# Patient Record
Sex: Female | Born: 1945 | Race: White | Hispanic: No | Marital: Single | Smoking: Former smoker
Health system: Southern US, Community
[De-identification: ages and names within clinical notes are randomized; demographics above are authoritative.]

## PROBLEM LIST (undated history)

## (undated) DIAGNOSIS — F32A Depression, unspecified: Secondary | ICD-10-CM

## (undated) DIAGNOSIS — Z86718 Personal history of other venous thrombosis and embolism: Secondary | ICD-10-CM

## (undated) DIAGNOSIS — K219 Gastro-esophageal reflux disease without esophagitis: Secondary | ICD-10-CM

## (undated) DIAGNOSIS — M810 Age-related osteoporosis without current pathological fracture: Secondary | ICD-10-CM

## (undated) DIAGNOSIS — I10 Essential (primary) hypertension: Secondary | ICD-10-CM

## (undated) DIAGNOSIS — F329 Major depressive disorder, single episode, unspecified: Secondary | ICD-10-CM

## (undated) DIAGNOSIS — T7840XA Allergy, unspecified, initial encounter: Secondary | ICD-10-CM

## (undated) DIAGNOSIS — D126 Benign neoplasm of colon, unspecified: Secondary | ICD-10-CM

## (undated) DIAGNOSIS — I350 Nonrheumatic aortic (valve) stenosis: Secondary | ICD-10-CM

## (undated) DIAGNOSIS — K621 Rectal polyp: Secondary | ICD-10-CM

## (undated) DIAGNOSIS — K52832 Lymphocytic colitis: Secondary | ICD-10-CM

## (undated) HISTORY — DX: Lymphocytic colitis: K52.832

## (undated) HISTORY — DX: Personal history of other venous thrombosis and embolism: Z86.718

## (undated) HISTORY — PX: HAND SURGERY: SHX662

## (undated) HISTORY — DX: Age-related osteoporosis without current pathological fracture: M81.0

## (undated) HISTORY — DX: Gastro-esophageal reflux disease without esophagitis: K21.9

## (undated) HISTORY — DX: Allergy, unspecified, initial encounter: T78.40XA

## (undated) HISTORY — DX: Benign neoplasm of colon, unspecified: D12.6

## (undated) HISTORY — DX: Essential (primary) hypertension: I10

## (undated) HISTORY — DX: Major depressive disorder, single episode, unspecified: F32.9

## (undated) HISTORY — DX: Rectal polyp: K62.1

## (undated) HISTORY — PX: TONSILLECTOMY AND ADENOIDECTOMY: SHX28

## (undated) HISTORY — DX: Depression, unspecified: F32.A

## (undated) HISTORY — PX: TRANSCATHETER AORTIC VALVE REPLACEMENT, TRANSFEMORAL: SHX6400

## (undated) HISTORY — PX: COLONOSCOPY: SHX174

---

## 1986-08-09 HISTORY — PX: FOOT SURGERY: SHX648

## 2000-06-13 ENCOUNTER — Other Ambulatory Visit: Admission: RE | Admit: 2000-06-13 | Discharge: 2000-06-13 | Payer: Self-pay | Admitting: Obstetrics and Gynecology

## 2001-08-22 ENCOUNTER — Other Ambulatory Visit: Admission: RE | Admit: 2001-08-22 | Discharge: 2001-08-22 | Payer: Self-pay | Admitting: Obstetrics and Gynecology

## 2002-04-23 ENCOUNTER — Encounter: Payer: Self-pay | Admitting: Family Medicine

## 2002-04-23 ENCOUNTER — Ambulatory Visit (HOSPITAL_COMMUNITY): Admission: RE | Admit: 2002-04-23 | Discharge: 2002-04-23 | Payer: Self-pay | Admitting: Family Medicine

## 2003-07-25 ENCOUNTER — Ambulatory Visit (HOSPITAL_COMMUNITY): Admission: RE | Admit: 2003-07-25 | Discharge: 2003-07-25 | Payer: Self-pay | Admitting: Family Medicine

## 2004-07-22 ENCOUNTER — Ambulatory Visit (HOSPITAL_COMMUNITY): Admission: RE | Admit: 2004-07-22 | Discharge: 2004-07-22 | Payer: Self-pay | Admitting: Family Medicine

## 2004-09-08 ENCOUNTER — Other Ambulatory Visit: Admission: RE | Admit: 2004-09-08 | Discharge: 2004-09-08 | Payer: Self-pay | Admitting: Family Medicine

## 2004-10-19 ENCOUNTER — Encounter: Admission: RE | Admit: 2004-10-19 | Discharge: 2005-01-17 | Payer: Self-pay | Admitting: Family Medicine

## 2005-09-14 ENCOUNTER — Other Ambulatory Visit: Admission: RE | Admit: 2005-09-14 | Discharge: 2005-09-14 | Payer: Self-pay | Admitting: Family Medicine

## 2005-09-28 ENCOUNTER — Encounter: Admission: RE | Admit: 2005-09-28 | Discharge: 2005-09-28 | Payer: Self-pay | Admitting: Family Medicine

## 2005-12-30 ENCOUNTER — Encounter: Admission: RE | Admit: 2005-12-30 | Discharge: 2005-12-30 | Payer: Self-pay | Admitting: Family Medicine

## 2006-01-10 ENCOUNTER — Encounter: Admission: RE | Admit: 2006-01-10 | Discharge: 2006-01-10 | Payer: Self-pay | Admitting: Family Medicine

## 2006-06-16 ENCOUNTER — Encounter: Admission: RE | Admit: 2006-06-16 | Discharge: 2006-06-16 | Payer: Self-pay | Admitting: Family Medicine

## 2006-07-04 ENCOUNTER — Encounter: Admission: RE | Admit: 2006-07-04 | Discharge: 2006-07-04 | Payer: Self-pay | Admitting: Family Medicine

## 2008-01-09 ENCOUNTER — Encounter: Admission: RE | Admit: 2008-01-09 | Discharge: 2008-01-09 | Payer: Self-pay | Admitting: Family Medicine

## 2009-01-16 ENCOUNTER — Encounter: Admission: RE | Admit: 2009-01-16 | Discharge: 2009-01-16 | Payer: Self-pay | Admitting: Family Medicine

## 2010-08-25 ENCOUNTER — Encounter
Admission: RE | Admit: 2010-08-25 | Discharge: 2010-08-25 | Payer: Self-pay | Source: Home / Self Care | Attending: Family Medicine | Admitting: Family Medicine

## 2010-09-01 ENCOUNTER — Encounter
Admission: RE | Admit: 2010-09-01 | Discharge: 2010-09-01 | Payer: Self-pay | Source: Home / Self Care | Attending: Family Medicine | Admitting: Family Medicine

## 2011-04-16 ENCOUNTER — Ambulatory Visit: Payer: Self-pay | Admitting: Family Medicine

## 2011-04-26 ENCOUNTER — Ambulatory Visit (INDEPENDENT_AMBULATORY_CARE_PROVIDER_SITE_OTHER): Payer: BC Managed Care – PPO | Admitting: Family Medicine

## 2011-04-26 ENCOUNTER — Encounter: Payer: Self-pay | Admitting: Family Medicine

## 2011-04-26 DIAGNOSIS — R030 Elevated blood-pressure reading, without diagnosis of hypertension: Secondary | ICD-10-CM

## 2011-04-26 DIAGNOSIS — F418 Other specified anxiety disorders: Secondary | ICD-10-CM | POA: Insufficient documentation

## 2011-04-26 DIAGNOSIS — I10 Essential (primary) hypertension: Secondary | ICD-10-CM | POA: Insufficient documentation

## 2011-04-26 DIAGNOSIS — E559 Vitamin D deficiency, unspecified: Secondary | ICD-10-CM

## 2011-04-26 DIAGNOSIS — G4700 Insomnia, unspecified: Secondary | ICD-10-CM | POA: Insufficient documentation

## 2011-04-26 DIAGNOSIS — F329 Major depressive disorder, single episode, unspecified: Secondary | ICD-10-CM

## 2011-04-26 DIAGNOSIS — J45909 Unspecified asthma, uncomplicated: Secondary | ICD-10-CM | POA: Insufficient documentation

## 2011-04-26 DIAGNOSIS — F32A Depression, unspecified: Secondary | ICD-10-CM

## 2011-04-26 DIAGNOSIS — R609 Edema, unspecified: Secondary | ICD-10-CM

## 2011-04-26 DIAGNOSIS — IMO0001 Reserved for inherently not codable concepts without codable children: Secondary | ICD-10-CM

## 2011-04-26 DIAGNOSIS — F3289 Other specified depressive episodes: Secondary | ICD-10-CM

## 2011-04-26 LAB — BASIC METABOLIC PANEL
BUN: 26 mg/dL — ABNORMAL HIGH (ref 6–23)
CO2: 25 mEq/L (ref 19–32)
Calcium: 9.4 mg/dL (ref 8.4–10.5)
Chloride: 104 mEq/L (ref 96–112)
Creatinine, Ser: 0.8 mg/dL (ref 0.4–1.2)
GFR: 80.19 mL/min (ref >60.00)
Glucose, Bld: 91 mg/dL (ref 70–99)
Potassium: 3.7 mEq/L (ref 3.5–5.1)
Sodium: 137 mEq/L (ref 135–145)

## 2011-04-26 MED ORDER — HYDROCHLOROTHIAZIDE 25 MG PO TABS: 25.0000 mg | ORAL_TABLET | Freq: Every day | ORAL | Status: DC

## 2011-04-26 MED ORDER — FLUTICASONE-SALMETEROL 250-50 MCG/DOSE IN AEPB: 1.0000 | INHALATION_SPRAY | Freq: Two times a day (BID) | RESPIRATORY_TRACT | Status: DC

## 2011-04-26 MED ORDER — TRAZODONE HCL 50 MG PO TABS: ORAL_TABLET | ORAL | Status: DC

## 2011-04-26 MED ORDER — BUPROPION HCL ER (XL) 300 MG PO TB24: 300.0000 mg | ORAL_TABLET | Freq: Every day | ORAL | Status: DC

## 2011-04-26 NOTE — Assessment & Plan Note (Signed)
hctz 25m g  1 po qd  Elevate legs 

## 2011-04-26 NOTE — Assessment & Plan Note (Signed)
Stable. Refill meds

## 2011-04-26 NOTE — Assessment & Plan Note (Signed)
Refill meds

## 2011-04-26 NOTE — Patient Instructions (Signed)

## 2011-04-26 NOTE — Progress Notes (Signed)
  Subjective:    Patient ID: Samantha Davidson, female    DOB: 1946-06-13, 65 y.o.   MRN: [id number]  HPI Pt here to establish and get medication refills.  Pt with no complaints.   Past Medical History  Diagnosis Date  . Allergy   . Asthma   . Depression   . GERD (gastroesophageal reflux disease)    History   Social History  . Marital Status: Married    Spouse Name: N/A    Number of Children: N/A  . Years of Education: N/A   Occupational History  . interior designer    Social History Main Topics  . Smoking status: Passive Smoker  . Smokeless tobacco: Not on file   Comment: only when stressed  . Alcohol Use: Yes  . Drug Use: No  . Sexually Active: Not on file   Other Topics Concern  . Not on file   Social History Narrative  . No narrative on file   Family History  Problem Relation Age of Onset  . Arthritis Maternal Grandmother   . Lung cancer Mother   . Heart disease Mother   . Mental illness Mother    Review of Systems as above   Objective:   Physical Exam  Constitutional: She is oriented to person, place, and time. She appears well-developed and well-nourished.  Neck: Normal range of motion. Neck supple.  Cardiovascular: Normal rate, regular rhythm and normal heart sounds.   No murmur heard. Pulmonary/Chest: Effort normal and breath sounds normal. No respiratory distress. She has no wheezes. She has no rales. She exhibits no tenderness.  Musculoskeletal: She exhibits no edema.  Neurological: She is alert and oriented to person, place, and time.  Skin: Skin is warm and dry.  Psychiatric: She has a normal mood and affect. Her behavior is normal. Judgment and thought content normal.          Assessment & Plan:

## 2011-04-26 NOTE — Assessment & Plan Note (Signed)
New-- hctz secondary to edema and recent travel to [place] (high salt foods and flying) Recheck 2 weeks

## 2011-04-28 LAB — VITAMIN D 1,25 DIHYDROXY
Vitamin D 1, 25 (OH)2 Total: 73 pg/mL — ABNORMAL HIGH (ref 18–72)
Vitamin D2 1, 25 (OH)2: 18 pg/mL
Vitamin D3 1, 25 (OH)2: 55 pg/mL

## 2011-05-17 ENCOUNTER — Encounter: Payer: Self-pay | Admitting: Family Medicine

## 2011-05-17 ENCOUNTER — Ambulatory Visit (INDEPENDENT_AMBULATORY_CARE_PROVIDER_SITE_OTHER): Payer: BC Managed Care – PPO | Admitting: Family Medicine

## 2011-05-17 VITALS — BP 144/98 | HR 73 | Temp 98.8°F | Wt 167.0 lb

## 2011-05-17 DIAGNOSIS — J309 Allergic rhinitis, unspecified: Secondary | ICD-10-CM

## 2011-05-17 DIAGNOSIS — Z9109 Other allergy status, other than to drugs and biological substances: Secondary | ICD-10-CM

## 2011-05-17 DIAGNOSIS — I10 Essential (primary) hypertension: Secondary | ICD-10-CM

## 2011-05-17 LAB — BASIC METABOLIC PANEL
BUN: 12 mg/dL (ref 6–23)
CO2: 30 mEq/L (ref 19–32)
Calcium: 9.2 mg/dL (ref 8.4–10.5)
Chloride: 102 mEq/L (ref 96–112)
Creatinine, Ser: 0.6 mg/dL (ref 0.4–1.2)
GFR: 99.25 mL/min (ref >60.00)
Glucose, Bld: 86 mg/dL (ref 70–99)
Potassium: 4.3 mEq/L (ref 3.5–5.1)
Sodium: 141 mEq/L (ref 135–145)

## 2011-05-17 MED ORDER — EPINEPHRINE 0.3 MG/0.3ML IJ DEVI: 0.3000 mg | Freq: Once | INTRAMUSCULAR | Status: AC

## 2011-05-17 MED ORDER — LISINOPRIL-HYDROCHLOROTHIAZIDE 10-12.5 MG PO TABS: 1.0000 | ORAL_TABLET | Freq: Every day | ORAL | Status: DC

## 2011-05-17 NOTE — Patient Instructions (Signed)

## 2011-05-17 NOTE — Progress Notes (Signed)
  Subjective:    Patient here for follow-up of elevated blood pressure.  She is exercising and is adherent to a low-salt diet.  Blood pressure in not checked  at home. Cardiac symptoms: none. Patient denies: chest pain, chest pressure/discomfort, claudication, dyspnea, exertional chest pressure/discomfort, irregular heart beat, lower extremity edema, near-syncope, orthopnea, palpitations, paroxysmal nocturnal dyspnea, syncope and tachypnea. Cardiovascular risk factors: hypertension. Use of agents associated with hypertension: none. History of target organ damage: none.  The following portions of the patient's history were reviewed and updated as appropriate: allergies, current medications, past family history, past medical history, past social history, past surgical history and problem list.  Review of Systems Pertinent items are noted in HPI.     Objective:    BP 144/98  Pulse 73  Temp(Src) 98.8 F (37.1 C) (Oral)  Wt 167 lb (75.751 kg)  SpO2 96% General appearance: alert, cooperative, appears stated age and no distress Lungs: clear to auscultation bilaterally Heart: regular rate and rhythm, S1, S2 normal, no murmur, click, rub or gallop Extremities: extremities normal, atraumatic, no cyanosis or edema    Assessment:    Hypertension, stage 1 . Evidence of target organ damage: none.    Plan:    Medication: discontinue hctz and begin lisinopril hct. Dietary sodium restriction. Regular aerobic exercise. Follow up: 2 weeks and as needed.

## 2011-05-31 ENCOUNTER — Encounter: Payer: Self-pay | Admitting: Family Medicine

## 2011-05-31 ENCOUNTER — Ambulatory Visit (INDEPENDENT_AMBULATORY_CARE_PROVIDER_SITE_OTHER): Payer: BC Managed Care – PPO | Admitting: Family Medicine

## 2011-05-31 VITALS — BP 140/82 | HR 67 | Temp 98.7°F | Wt 165.4 lb

## 2011-05-31 DIAGNOSIS — I10 Essential (primary) hypertension: Secondary | ICD-10-CM

## 2011-05-31 LAB — BASIC METABOLIC PANEL
BUN: 14 mg/dL (ref 6–23)
CO2: 31 mEq/L (ref 19–32)
Calcium: 9.2 mg/dL (ref 8.4–10.5)
Chloride: 97 mEq/L (ref 96–112)
Creatinine, Ser: 0.7 mg/dL (ref 0.4–1.2)
GFR: 97.48 mL/min (ref >60.00)
Glucose, Bld: 106 mg/dL — ABNORMAL HIGH (ref 70–99)
Potassium: 4 mEq/L (ref 3.5–5.1)
Sodium: 136 mEq/L (ref 135–145)

## 2011-05-31 NOTE — Patient Instructions (Signed)

## 2011-05-31 NOTE — Progress Notes (Signed)
  Subjective:    Patient here for follow-up of elevated blood pressure.  She is exercising and is adherent to a low-salt diet.  Blood pressure is well controlled at home. Cardiac symptoms: none. Patient denies: chest pain, chest pressure/discomfort, claudication, dyspnea, exertional chest pressure/discomfort, fatigue, irregular heart beat, lower extremity edema, near-syncope, orthopnea, palpitations, paroxysmal nocturnal dyspnea, syncope and tachypnea. Cardiovascular risk factors: none. Use of agents associated with hypertension: none. History of target organ damage: none.  The following portions of the patient's history were reviewed and updated as appropriate: allergies, current medications, past family history, past medical history, past social history, past surgical history and problem list.  Review of Systems Pertinent items are noted in HPI.     Objective:    BP 140/82  Pulse 67  Temp(Src) 98.7 F (37.1 C) (Oral)  Wt 165 lb 6.4 oz (75.025 kg)  SpO2 99% General appearance: alert, cooperative, appears stated age and no distress Lungs: clear to auscultation bilaterally Heart: regular rate and rhythm, S1, S2 normal, no murmur, click, rub or gallop Extremities: extremities normal, atraumatic, no cyanosis or edema    Assessment:    Hypertension, normal blood pressure . Evidence of target organ damage: none.    Plan:    Medication: no change. Dietary sodium restriction. Regular aerobic exercise. Check blood pressures 2-3 times weekly and record. Follow up: 3 months and as needed.

## 2011-06-28 ENCOUNTER — Telehealth: Payer: Self-pay | Admitting: Family Medicine

## 2011-06-28 MED ORDER — AMLODIPINE BESYLATE 5 MG PO TABS: 5.0000 mg | ORAL_TABLET | Freq: Every day | ORAL | Status: DC

## 2011-06-28 NOTE — Telephone Encounter (Signed)
norvasc 5 mg  #30  1 po qd Stop lisinopril----needs ov 2 weeks

## 2011-06-28 NOTE — Telephone Encounter (Signed)
Discussed with patient and Rx sent to the pharmacy--apt scheduled   KP

## 2011-07-13 ENCOUNTER — Ambulatory Visit (INDEPENDENT_AMBULATORY_CARE_PROVIDER_SITE_OTHER): Payer: BC Managed Care – PPO | Admitting: Family Medicine

## 2011-07-13 ENCOUNTER — Encounter: Payer: Self-pay | Admitting: Family Medicine

## 2011-07-13 VITALS — BP 136/90 | HR 75 | Temp 98.8°F | Wt 167.4 lb

## 2011-07-13 DIAGNOSIS — I10 Essential (primary) hypertension: Secondary | ICD-10-CM

## 2011-07-13 DIAGNOSIS — Z9109 Other allergy status, other than to drugs and biological substances: Secondary | ICD-10-CM

## 2011-07-13 DIAGNOSIS — J309 Allergic rhinitis, unspecified: Secondary | ICD-10-CM

## 2011-07-13 MED ORDER — LEVOCETIRIZINE DIHYDROCHLORIDE 5 MG PO TABS: 5.0000 mg | ORAL_TABLET | Freq: Every evening | ORAL | Status: DC

## 2011-07-13 MED ORDER — CARVEDILOL PHOSPHATE ER 10 MG PO CP24: 10.0000 mg | ORAL_CAPSULE | Freq: Every day | ORAL | Status: DC

## 2011-07-13 NOTE — Patient Instructions (Signed)

## 2011-07-13 NOTE — Progress Notes (Signed)
  Subjective:    Patient here for follow-up of elevated blood pressure.  She is exercising and is adherent to a low-salt diet.  Blood pressure is well controlled at home. Cardiac symptoms: none. Patient denies: chest pain, chest pressure/discomfort, claudication, dyspnea, exertional chest pressure/discomfort, fatigue, irregular heart beat, lower extremity edema, near-syncope, orthopnea, palpitations, paroxysmal nocturnal dyspnea, syncope and tachypnea. Cardiovascular risk factors: hypertension. Use of agents associated with hypertension: none. History of target organ damage: none. Pt states the norvasc is causing itchy throat. The following portions of the patient's history were reviewed and updated as appropriate: allergies, current medications, past family history, past medical history, past social history, past surgical history and problem list.  Review of Systems Pertinent items are noted in HPI.     Objective:    BP 136/90  Pulse 75  Temp(Src) 98.8 F (37.1 C) (Oral)  Wt 167 lb 6.4 oz (75.932 kg)  SpO2 98% General appearance: alert, cooperative, appears stated age and no distress Neck: no adenopathy, no carotid bruit, no JVD, supple, symmetrical, trachea midline and thyroid not enlarged, symmetric, no tenderness/mass/nodules Lungs: clear to auscultation bilaterally Heart: regular rate and rhythm, S1, S2 normal, no murmur, click, rub or gallop Extremities: extremities normal, atraumatic, no cyanosis or edema    Assessment:    Hypertension, stage 1 . Evidence of target organ damage: none.   Allergies--xyzal Plan:    Medication: discontinue norvasc and begin coreg. Dietary sodium restriction. Regular aerobic exercise. Check blood pressures 2-3 times weekly and record.

## 2011-07-27 ENCOUNTER — Encounter: Payer: Self-pay | Admitting: Family Medicine

## 2011-07-27 ENCOUNTER — Ambulatory Visit (INDEPENDENT_AMBULATORY_CARE_PROVIDER_SITE_OTHER): Payer: BC Managed Care – PPO | Admitting: Family Medicine

## 2011-07-27 VITALS — BP 140/82 | HR 75 | Temp 97.5°F | Wt 168.4 lb

## 2011-07-27 DIAGNOSIS — J309 Allergic rhinitis, unspecified: Secondary | ICD-10-CM

## 2011-07-27 DIAGNOSIS — J302 Other seasonal allergic rhinitis: Secondary | ICD-10-CM

## 2011-07-27 DIAGNOSIS — I10 Essential (primary) hypertension: Secondary | ICD-10-CM

## 2011-07-27 MED ORDER — FEXOFENADINE HCL 180 MG PO TABS: 180.0000 mg | ORAL_TABLET | Freq: Every day | ORAL | Status: DC

## 2011-07-27 NOTE — Progress Notes (Signed)
  Subjective:    Patient here for follow-up of elevated blood pressure.  She is not exercising and is adherent to a low-salt diet.  Blood pressure is well controlled at home. Cardiac symptoms: none. Patient denies: chest pain, claudication, dyspnea, fatigue, irregular heart beat, lower extremity edema, near-syncope, orthopnea, palpitations, paroxysmal nocturnal dyspnea, syncope and tachypnea. Cardiovascular risk factors: none. Use of agents associated with hypertension: none. History of target organ damage: none.  The following portions of the patient's history were reviewed and updated as appropriate: allergies, current medications, past family history, past medical history, past social history, past surgical history and problem list.  Review of Systems Pertinent items are noted in HPI.     Objective:    BP 140/82  Pulse 75  Temp(Src) 97.5 F (36.4 C) (Oral)  Wt 168 lb 6.4 oz (76.386 kg)  SpO2 96% General appearance: alert, cooperative, appears stated age and no distress Throat: lips, mucosa, and tongue normal; teeth and gums normal Neck: no adenopathy, no carotid bruit, no JVD, supple, symmetrical, trachea midline and thyroid not enlarged, symmetric, no tenderness/mass/nodules Lungs: clear to auscultation bilaterally Heart: regular rate and rhythm, S1, S2 normal, no murmur, click, rub or gallop Extremities: extremities normal, atraumatic, no cyanosis or edema    Assessment:    Hypertension, normal blood pressure . Evidence of target organ damage: none.    Plan:    Medication: no change. Dietary sodium restriction. Regular aerobic exercise. Check blood pressures 2-3  times weekly and record. Follow up: 3 months and as needed.

## 2011-07-27 NOTE — Patient Instructions (Signed)

## 2011-08-22 ENCOUNTER — Other Ambulatory Visit: Payer: Self-pay | Admitting: Family Medicine

## 2011-08-23 NOTE — Telephone Encounter (Signed)
Last seen 12/18, follow up on 10/25/11.  RX sent.

## 2011-08-31 ENCOUNTER — Ambulatory Visit: Payer: BC Managed Care – PPO | Admitting: Family Medicine

## 2011-09-06 ENCOUNTER — Other Ambulatory Visit: Payer: Self-pay | Admitting: Family Medicine

## 2011-09-06 DIAGNOSIS — I10 Essential (primary) hypertension: Secondary | ICD-10-CM

## 2011-09-06 MED ORDER — CARVEDILOL PHOSPHATE ER 10 MG PO CP24: 10.0000 mg | ORAL_CAPSULE | Freq: Every day | ORAL | Status: DC

## 2011-09-06 NOTE — Telephone Encounter (Signed)
Faxed.   KP 

## 2011-09-27 ENCOUNTER — Ambulatory Visit (INDEPENDENT_AMBULATORY_CARE_PROVIDER_SITE_OTHER): Payer: BC Managed Care – PPO | Admitting: Family Medicine

## 2011-09-27 ENCOUNTER — Encounter: Payer: Self-pay | Admitting: Family Medicine

## 2011-09-27 VITALS — BP 140/88 | HR 74 | Temp 98.6°F | Wt 172.4 lb

## 2011-09-27 DIAGNOSIS — M546 Pain in thoracic spine: Secondary | ICD-10-CM

## 2011-09-27 MED ORDER — CYCLOBENZAPRINE HCL 10 MG PO TABS: 10.0000 mg | ORAL_TABLET | Freq: Three times a day (TID) | ORAL | Status: AC | PRN

## 2011-09-27 NOTE — Patient Instructions (Signed)
Back Injury Prevention Back injuries are extremely painful, difficult to heal, and have an effect on everything you do. After suffering one back injury, you are much more likely to experience another later on. It is important to learn how to avoid injuring or re-injuring your back. You can learn proper lifting techniques and the basics of back safety, saving yourself a lot of pain and a lifetime of back problems.  WHY DO BACK INJURIES OCCUR?  The lower part of the back holds most of the body's weight.   Every time you bend over, lift a heavy object, or sit leaning forward, you put stress on your spine.   Over time, the discs between your vertebrae can start to wear out and become damaged.   Repetitive bending and lifting can quickly cause back problems.   Even leaning forward while sitting at a desk or table can eventually cause damage and pain.  The following are contributing factors, and related tips to prevent back injury. POOR PHYSICAL CONDITION, EXTRA WEIGHT, AND INACTIVITY  Your stomach muscles provide a lot of the support needed by your back.   If you have weak stomach muscles, your back may not get all the support it needs, especially when you are lifting or carrying heavy objects.   Good general physical condition is important for preventing strains, sprains, and other injuries. Exercise regularly and try to develop good tone in your abdominal (stomach) muscles.   The more you weigh, the more stress is placed on your back every time you bend over, at a ratio of 10:1. For every pound of weight, 10 times that amount of pressure is placed on the back.   Regular aerobic exercise (walking, jogging, biking, swimming) has been shown to decrease back injuries.   Exercises that increase balance and strength can decrease your risk of falling and injuring your back or breaking bones. Exercises such as tai chi and yoga, or any weight-bearing exercise that challenges your balance, are good for  increasing balance and strength.   Stretching and strengthening exercises can also reduce the risk of injuries.   Maintaining your ideal body weight is also important for having a healthy back.  DIET  In order to keep your spine strong, you will need to get enough calcium and vitamin D in your diet, to help prevent osteoporosis (bones becoming full of pores and weak, with age or diet deficiency).   Osteoporosis is responsible for many bone fractures that lead to back pain.   Calcium can be found in dairy products, green, leafy vegetables, and products with calcium added (fortified), like some orange juices.   Although your skin makes vitamin D when you are in the sun, you can also get it from your diet.   Vitamin D is found in milk and foods that have this vitamin added (fortified).   Many adults do not get enough calcium and vitamin D in their diet.   You should talk to your caregiver about how much calcium and vitamin D you need per day. Consider taking a nutritional supplement or a multivitamin.  POOR POSTURE  Sit and stand up straight.   It is best to try to maintain the back in its natural, slight "S" shaped curve.   Avoid leaning forward (unsupported) when you sit, or hunching over while you are standing.   A chair with good lumbar (low back) support helps protect the back when you sit.   If you work at a desk, sit close to  your work so you do not need to lean over. Keep your chin tucked in. Keep your neck drawn back and elbows bent at 90 degrees to your spine.   Sit high and close to the steering wheel when you drive. Add a lumbar support to your car seat, if needed.   Avoid sitting or standing too long in 1 position. Sitting can be hard on the lower back. Take breaks, get up, stretch and walk around frequently (at least every hour).   Avoid sleeping in an unnatural position. Sleep on your side (with knees slightly bent), or on your back (with a pillow under your knees). Do  not sleep on your stomach.  OVEREXERTION AND SUDDEN TWISTS OR MOVEMENTS  Avoid working in odd, uncomfortable positions, such as when gardening, kneeling, or doing tasks that require you to bend over for long periods of time.   Strech before exerting: If you know you will be doing work that might stress your back, take the time to stretch and loosen your muscles before starting, just like an athlete before a workout. This will help you avoid painful strains and sprains.   Slow down: If you are doing a lot of heavy, repetitive lifting, work slowly. Allow yourself more recovery time between lifts. Over working your back will cost you more time later, when you need medical attention or when you find every movement painful.   It is important to recognize your physical limitations and abilities.   Do not hesitate to say, "This is too heavy for me to lift alone."   Many people have injured their backs because they were afraid to ask for help. Ask for help!   Certain actions, motions and movements are more likely than others to cause or contribute to back injuries.   Avoid heavy lifting, especially repetitive lifting over a long period of time.   Avoid twisting at the waist, while lifting or holding a heavy load.   Avoid reaching and lifting over your head, across a table, or for an object on an elevated surface. Do not lean forward and lift heavy objects that are far from your body.   Concentrate on keeping your shoulders pulled back and your low back straight.   Never bend over without bending your knees.   Bend at your knees, instead of your back, when you pick up objects. Instead of using your back like a crane, let your legs do the work.   Try not to lift heavy things higher than your waist, or reach for objects on shelves above your head.   When lifting:   Take a balanced stance, with your feet about shoulder width apart. One foot can be behind the object and the other next to it.    Squat down to lift the object, but keep your heels off the floor.   Get as close to the object to be lifted as you can.   Lift gradually, without jerking, using (tightening) your leg, abdominal and buttock muscles. Keep the load as close to you as possible.   Keep your back straight.   Keep your chin tucked in, to maintain a relatively straight back and neck line.   Once you are standing, change directions by pointing your feet in the direction you want to go and turning your whole body. Avoid twisting at your waist while carrying a load.   When you put a load down, use these same guidelines in reverse.   Reduce the amount of weight  lifted. If you're moving books, it is better to load several small boxes rather than 1 heavy box.   Use handles and lifting straps.   Do not turn or twist while holding an object. Turn at your feet, not your back.   Avoid lifting or carrying objects with awkward or odd shapes. Get help if the shape is too awkward for you to lift and move by yourself.   The use of wide elastic belts, that can be worn and tightened to "pull in" lumbar and abdominal muscles, to prevent low back pain, is controversial.  STRESS  Tense muscles are more vulnerable to strains and spasms.   Mental stress that you experience is directed to your muscles, neck, and back.   Find constructive ways, including exercise and other relaxation techniques, to decrease the stress you experience and decrease its impact on your body.   Massage can help reduce the stress built up in your muscles and back.  ENVIRONMENTAL CAUSES AND SOLUTIONS  You could injure your back from slipping on a wet floor or ice. Avoid wet and newly mopped surfaces, and make sure that ice around your home and office walkways is removed or treated.   Sleeping on a mattress that is too soft or too hard can hurt your back. If too soft, consider inserting a large plywood board between your mattress and box spring, or  replacing your mattress. Mattresses and box springs should be replaced every 10 to 15 years, depending on the product.   Place, store, and position objects up off the floor. That way, you will not need to reach down to pick them up again.   Raise or lower shelves, so you do not need to reach, or twist your back, neck, and shoulders.   Put heavier objects on shelves at waist level, and lighter objects on lower or higher shelves.   Use carts and dollies to move objects, rather than carrying them yourself.   It is better to push a cart, dolly, lawnmower, or wheelbarrow, than it is to pull it. If you do need to pull it, force yourself to tighten your stomach muscles and try to maintain good body posture.   Use cranes, hoists, a lift table, or other lift-assist devices whenever you can.  Your caregiver can provide additional information about preventing back (and other injuries) when you seek care, to avoid a first or recurrent back injury. If you injure your back, visit your caregiver so you can be assessed and treated.  Document Released: 09/02/2004 Document Revised: 04/07/2011 Document Reviewed: 05/26/2009 [facility] Patient Information 2012 [place], [place].

## 2011-09-27 NOTE — Progress Notes (Signed)
  Subjective:    Samantha Davidson is a 66 y.o. female who presents for evaluation of low back pain. The patient has had no prior back problems. Symptoms have been present for 2 months and are gradually worsening.  Onset was related to / precipitated by she almost fell on ice about 2 months ago and caught herself. The pain is located in the thoracic spine under L spaula and radiates to the left hand, neck. The pain is described as burning and pinching and occurs all day. She rates her pain as a 6 on a scale of 0-10. Symptoms are exacerbated by any movement. Symptoms are improved by nothing. She has also tried acetaminophen, heat, ice, NSAIDs and rest which provided no symptom relief. She has numbness and tingling in her L hand associated with the back pain. The patient has no "red flag" history indicative of complicated back pain.  The following portions of the patient's history were reviewed and updated as appropriate: allergies, current medications, past family history, past medical history, past social history, past surgical history and problem list.  Review of Systems Pertinent items are noted in HPI.    Objective:   Inspection and palpation: inspection of back is normal. Muscle tone and ROM exam: muscle spasm noted medial to L scapula, full range of motion with pain. Straight leg raise: negative at 180 degrees bilaterally. Neurological: dtr = and b/l  ----  slightl dec strenth L arm--pt states it causes pain.    Assessment:    Nonspecific acute low back pain    Plan:    Educational material distributed. Stretching exercises discussed. Short (2-4 day) period of relative rest recommended until acute symptoms improve. Ice to affected area as needed for local pain relief. Heat to affected area as needed for local pain relief. OTC analgesics as needed. Muscle relaxants per medication orders. Follow-up in 2 weeks. --or sooner prn

## 2011-10-25 ENCOUNTER — Ambulatory Visit: Payer: BC Managed Care – PPO | Admitting: Family Medicine

## 2011-11-01 ENCOUNTER — Ambulatory Visit (INDEPENDENT_AMBULATORY_CARE_PROVIDER_SITE_OTHER): Payer: BC Managed Care – PPO | Admitting: Family Medicine

## 2011-11-01 ENCOUNTER — Encounter: Payer: Self-pay | Admitting: Family Medicine

## 2011-11-01 VITALS — BP 132/88 | HR 71 | Temp 98.4°F | Wt 164.8 lb

## 2011-11-01 DIAGNOSIS — I10 Essential (primary) hypertension: Secondary | ICD-10-CM

## 2011-11-01 DIAGNOSIS — G589 Mononeuropathy, unspecified: Secondary | ICD-10-CM

## 2011-11-01 MED ORDER — CARVEDILOL PHOSPHATE ER 20 MG PO CP24: 20.0000 mg | ORAL_CAPSULE | Freq: Every day | ORAL | Status: DC

## 2011-11-01 NOTE — Progress Notes (Signed)
  Subjective:    Patient here for follow-up of elevated blood pressure.  She is not exercising-- although she runs around show room and is adherent to a low-salt diet.  Blood pressure not checked at home. Cardiac symptoms: none. Patient denies: chest pain, chest pressure/discomfort, claudication, dyspnea, exertional chest pressure/discomfort, fatigue, irregular heart beat, lower extremity edema, near-syncope, orthopnea, palpitations, paroxysmal nocturnal dyspnea, syncope and tachypnea. Cardiovascular risk factors: dyslipidemia and hypertension. Use of agents associated with hypertension: none. History of target organ damage: none.  The following portions of the patient's history were reviewed and updated as appropriate: allergies, current medications, past family history, past medical history, past social history, past surgical history and problem list.  Review of Systems Pertinent items are noted in HPI.     Objective:    BP 132/88  Pulse 71  Temp(Src) 98.4 F (36.9 C) (Oral)  Wt 164 lb 12.8 oz (74.753 kg)  SpO2 97% General appearance: alert, cooperative, appears stated age and no distress Lungs: clear to auscultation bilaterally Heart: S1, S2 normal Extremities: extremities normal, atraumatic, no cyanosis or edema    Assessment:    Hypertension, normal blood pressure . Evidence of target organ damage: none.    Plan:    Medication: increase to coreg. Dietary sodium restriction. Regular aerobic exercise. Follow up: 3 months and as needed.

## 2011-11-01 NOTE — Patient Instructions (Signed)
Pinched Nerve The term pinched nerve describes one type of damage or injury to a nerve or set of nerves. Pinched nerves can sometimes lead to other conditions. These include peripheral neuropathy, carpal tunnel syndrome, and tennis elbow. The extent of such injuries may vary from minor, temporary damage to a more permanent condition. Early diagnosis is important to prevent further damage or complications. Pinched nerve is a common cause of on-the-job injury. CAUSES  The injury may result from:  Compression.   Constriction.   Stretching.  SYMPTOMS  Symptoms include:  Numbness.   "Pins and needles" or burning sensations.   Pain radiating outward from the injured area.   One of the most common examples of a single compressed nerve is the feeling of having a foot or hand "fall asleep."  TREATMENT  The most often recommended treatment for pinched nerve is rest for the affected area. Corticosteroids help alleviate pain. In some cases, surgery is recommended. Physical therapy may be recommended. Splints or collars may be used. With treatment, most people recover from pinched nerve. In some cases, the damage is irreversible. Document Released: 07/16/2002 Document Revised: 07/15/2011 Document Reviewed: 07/03/2008 [facility] Patient Information 2012 [place], [place].

## 2011-12-23 ENCOUNTER — Other Ambulatory Visit: Payer: Self-pay | Admitting: Orthopaedic Surgery

## 2011-12-23 DIAGNOSIS — M542 Cervicalgia: Secondary | ICD-10-CM

## 2012-01-04 ENCOUNTER — Ambulatory Visit
Admission: RE | Admit: 2012-01-04 | Discharge: 2012-01-04 | Disposition: A | Discharge status: Home / Self Care | Payer: BC Managed Care – PPO | Source: Ambulatory Visit | Attending: Orthopaedic Surgery | Admitting: Orthopaedic Surgery

## 2012-01-04 DIAGNOSIS — M542 Cervicalgia: Secondary | ICD-10-CM

## 2012-01-19 ENCOUNTER — Telehealth: Payer: Self-pay | Admitting: Family Medicine

## 2012-01-19 NOTE — Telephone Encounter (Signed)
Caller: Teosha/Patient; PCP: Lelon Perla.; CB#: [phone]; ; ; Call regarding Rectal Bleeding and Diarrhea; Onset ~ 1 month ago.  Noted the "toilet was full of blood" the first time but this has improved.  Since this occurred, states she has noted ongoing diarrhea which has been present for a month, worse over past 2 weeks.  Cotninues to have blood noted on the surface of the stool and on wiping.  Has occurred 4-5 times during the day 01/19/12.  Per protocol, see Within 4 Hours' disposition; TC to office for appt assistance, as triaged after 1630; per Dr. Beverely Low, patient may wait until AM 01/20/12.  Appt sched with Dr. Laury Axon 01/20/12 1115.  Callback parameters given.

## 2012-01-20 ENCOUNTER — Ambulatory Visit (INDEPENDENT_AMBULATORY_CARE_PROVIDER_SITE_OTHER): Payer: BC Managed Care – PPO | Admitting: Family Medicine

## 2012-01-20 ENCOUNTER — Encounter: Payer: Self-pay | Admitting: Family Medicine

## 2012-01-20 VITALS — BP 132/88 | HR 65 | Temp 98.5°F | Wt 166.4 lb

## 2012-01-20 DIAGNOSIS — R197 Diarrhea, unspecified: Secondary | ICD-10-CM

## 2012-01-20 DIAGNOSIS — W57XXXA Bitten or stung by nonvenomous insect and other nonvenomous arthropods, initial encounter: Secondary | ICD-10-CM

## 2012-01-20 DIAGNOSIS — K625 Hemorrhage of anus and rectum: Secondary | ICD-10-CM

## 2012-01-20 LAB — HEPATIC FUNCTION PANEL
ALT: 21 U/L (ref 0–35)
AST: 21 U/L (ref 0–37)
Albumin: 3.9 g/dL (ref 3.5–5.2)
Alkaline Phosphatase: 63 U/L (ref 39–117)
Bilirubin, Direct: 0 mg/dL (ref 0.0–0.3)
Total Bilirubin: 0.6 mg/dL (ref 0.3–1.2)
Total Protein: 6.4 g/dL (ref 6.0–8.3)

## 2012-01-20 LAB — CBC WITH DIFFERENTIAL/PLATELET
Basophils Absolute: 0 10*3/uL (ref 0.0–0.1)
Basophils Relative: 0.4 % (ref 0.0–3.0)
Eosinophils Absolute: 0.1 10*3/uL (ref 0.0–0.7)
Eosinophils Relative: 2.1 % (ref 0.0–5.0)
HCT: 35.8 % — ABNORMAL LOW (ref 36.0–46.0)
Hemoglobin: 12.2 g/dL (ref 12.0–15.0)
Lymphocytes Relative: 26.6 % (ref 12.0–46.0)
Lymphs Abs: 1.5 10*3/uL (ref 0.7–4.0)
MCHC: 34 g/dL (ref 30.0–36.0)
MCV: 100.7 fl — ABNORMAL HIGH (ref 78.0–100.0)
Monocytes Absolute: 0.5 10*3/uL (ref 0.1–1.0)
Monocytes Relative: 8.7 % (ref 3.0–12.0)
Neutro Abs: 3.5 10*3/uL (ref 1.4–7.7)
Neutrophils Relative %: 62.2 % (ref 43.0–77.0)
Platelets: 202 10*3/uL (ref 150.0–400.0)
RBC: 3.56 Mil/uL — ABNORMAL LOW (ref 3.87–5.11)
RDW: 13.3 % (ref 11.5–14.6)
WBC: 5.7 10*3/uL (ref 4.5–10.5)

## 2012-01-20 LAB — BASIC METABOLIC PANEL
BUN: 15 mg/dL (ref 6–23)
CO2: 27 mEq/L (ref 19–32)
Calcium: 9.2 mg/dL (ref 8.4–10.5)
Chloride: 105 mEq/L (ref 96–112)
Creatinine, Ser: 0.7 mg/dL (ref 0.4–1.2)
GFR: 97.28 mL/min (ref >60.00)
Glucose, Bld: 88 mg/dL (ref 70–99)
Potassium: 4 mEq/L (ref 3.5–5.1)
Sodium: 140 mEq/L (ref 135–145)

## 2012-01-20 MED ORDER — OMEPRAZOLE 40 MG PO CPDR: 40.0000 mg | DELAYED_RELEASE_CAPSULE | Freq: Every day | ORAL | Status: DC

## 2012-01-20 NOTE — Progress Notes (Signed)
  Subjective:     Samantha Davidson is a 66 y.o. female who presents for evaluation of diarrhea. Onset of diarrhea was 6 weeks ago. Diarrhea is occurring approximately 6 times per day. Patient describes diarrhea as bloody and watery. Diarrhea has been associated with abdominal pain described as aching. Patient denies fever, illness in household contacts, recent antibiotic use, recent camping, recent travel, significant abdominal pain, unintentional weight loss. Previous visits for diarrhea: none. Evaluation to date: none.  Treatment to date: none.  The following portions of the patient's history were reviewed and updated as appropriate: allergies, current medications, past family history, past medical history, past social history, past surgical history and problem list.  Review of Systems Pertinent items are noted in HPI.    Objective:    BP 132/88  Pulse 65  Temp 98.5 F (36.9 C) (Oral)  Wt 166 lb 6.4 oz (75.479 kg)  SpO2 97% General: alert, cooperative, appears stated age and no distress  Hydration:  well hydrated  Abdomen:    soft, non-tender; bowel sounds normal; no masses,  no organomegaly and heme neg brown stool    Assessment:    pt with hx bloody diarrhea-=---heme neg today   Plan:    Appropriate educational material discussed and distributed. GI consult. Lab studies per orders. Medications per orders. Stool studies per orders.

## 2012-01-20 NOTE — Patient Instructions (Signed)
Gastrointestinal Bleeding Gastrointestinal (GI) bleeding is bleeding from the gut or any place between your mouth and anus. If bleeding is slow, you may be allowed to go home. If there is a lot of bleeding, hospitalization and observation are often required. SYMPTOMS   You vomit bright red blood or material that looks like coffee grounds.   You have blood in your stools or the stools look black and tarry.  DIAGNOSIS  Your caregiver may diagnose your condition by taking a history and a physical exam. More tests may be needed, including:  X-rays.   EGD (esophagogastroduodenoscopy), which looks at your esophagus, stomach, and small bowel through a flexible telescope-like instrument.   Colonoscopy, which looks at your colon/large bowel through a flexible telescope-like instrument.   Biopsies, which remove a small sample of tissue to examine under a microscope.  Finding out the results of your test Not all test results are available during your visit. If your test results are not back during the visit, make an appointment with your caregiver to find out the results. Do not assume everything is normal if you have not heard from your caregiver or the medical facility. It is important for you to follow up on all of your test results. HOME CARE INSTRUCTIONS   Follow instructions as suggested by your caregiver regarding medicines. Do not take aspirin, drink alcohol, or take medicines for pain and arthritis unless your caregiver says it is okay.   Get the suggested follow-up care when the tests are done.  SEEK IMMEDIATE MEDICAL CARE IF:   Your bleeding increases or you become lightheaded, weak, or pass out (faint).   You experience severe cramps in your stomach, back, or belly (abdomen).   You pass large clots.   The problems which brought you in for medical care get worse.  MAKE SURE YOU:   Understand these instructions.   Will watch your condition.   Will get help right away if you are  not doing well or get worse.  Document Released: 07/23/2000 Document Revised: 07/15/2011 Document Reviewed: 07/05/2011 [employer] Patient Information 2012 [employer]. 

## 2012-01-21 LAB — ROCKY MTN SPOTTED FVR AB, IGM-BLOOD: ROCKY MTN SPOTTED FEVER, IGM: 0.11 IV

## 2012-01-22 LAB — CLOSTRIDIUM DIFFICILE EIA: CDIFTX: NEGATIVE

## 2012-01-24 ENCOUNTER — Encounter: Payer: Self-pay | Admitting: Gastroenterology

## 2012-01-25 LAB — STOOL CULTURE

## 2012-01-26 ENCOUNTER — Ambulatory Visit: Payer: BC Managed Care – PPO | Admitting: Gastroenterology

## 2012-02-01 ENCOUNTER — Ambulatory Visit: Payer: BC Managed Care – PPO | Admitting: Family Medicine

## 2012-02-01 DIAGNOSIS — Z0289 Encounter for other administrative examinations: Secondary | ICD-10-CM

## 2012-02-16 ENCOUNTER — Other Ambulatory Visit (INDEPENDENT_AMBULATORY_CARE_PROVIDER_SITE_OTHER): Payer: BC Managed Care – PPO

## 2012-02-16 ENCOUNTER — Encounter: Payer: Self-pay | Admitting: Internal Medicine

## 2012-02-16 ENCOUNTER — Ambulatory Visit (INDEPENDENT_AMBULATORY_CARE_PROVIDER_SITE_OTHER): Payer: BC Managed Care – PPO | Admitting: Internal Medicine

## 2012-02-16 VITALS — BP 150/90 | HR 76 | Ht 64.0 in | Wt 166.6 lb

## 2012-02-16 DIAGNOSIS — R197 Diarrhea, unspecified: Secondary | ICD-10-CM

## 2012-02-16 DIAGNOSIS — K625 Hemorrhage of anus and rectum: Secondary | ICD-10-CM

## 2012-02-16 LAB — TSH: TSH: 1.15 u[IU]/mL (ref 0.35–5.50)

## 2012-02-16 MED ORDER — MOVIPREP 100 G PO SOLR: ORAL | Status: DC

## 2012-02-16 NOTE — Progress Notes (Signed)
  Subjective:    Patient ID: Samantha Davidson, female    DOB: 05/31/1946, 66 y.o.   MRN: [id number] Referred by: Lelon Perla, DO  HPI The patient is a delightful middle-aged white woman here because of a change in bowel habits with diarrhea, associated with rectal bleeding. About 3 months ago she noted onset of rectal bleeding in the toilet. A large amount of her red blood. She also developed diarrhea around the time with several loose and urgent stools. Never had problems like this before. She reports a colonoscopy age 61 that was unremarkable. She is somewhat concerned about colonoscopy because her mother suffered a perforation required surgery.  She is continued at 6-8 bowel movements a day there postprandial. Stool culture and C. difficile negative. No new medications. Defecation is urgent but she has not been incontinent. She has not lost weight. She sees bright red blood on the tissue paper a small amount in the toilet about once a week at this point.  Her GI review of systems is otherwise negative. Medications, allergies, past medical history, past surgical history, family history and social history are reviewed and updated in the EMR.  Review of Systems Positive for chronic cervical pain, currently her treatment.    Objective:   Physical Exam General:  Well-developed, well-nourished and in no acute distress Eyes:  anicteric. ENT:   Mouth and posterior pharynx free of lesions.  Neck:   supple w/o thyromegaly or mass.  Lungs: Clear to auscultation bilaterally. Heart:  S1S2, no rubs, murmurs, gallops. Abdomen:  soft, non-tender, no hepatosplenomegaly, hernia, or mass and BS+.  Rectal: Deferred until colonoscopy Lymph:  no cervical or supraclavicular adenopathy. Extremities:   no edema Skin   no rash. Neuro:  A&O x 3.  Psych:  appropriate mood and  Affect.   Data Reviewed: Stool studies  Lab Results  Component Value Date   WBC 5.7 01/20/2012   HGB 12.2 01/20/2012   HCT 35.8* 01/20/2012   MCV 100.7* 01/20/2012   PLT 202.0 01/20/2012          Assessment & Plan:   1. Diarrhea   2. Rectal bleeding    She has a chronic diarrhea problem but does not appear to be infectious and associated rectal bleeding. Colorectal neoplasia needs to be excluded, or if present belly was.  Colonoscopy will be scheduled. The risks and benefits as well as alternatives of endoscopic procedure(s) have been discussed and reviewed. All questions answered. The patient agrees to proceed.  I appreciate the opportunity to care for this patient.   CC: Loreen Freud, DO

## 2012-02-16 NOTE — Patient Instructions (Addendum)
You have been scheduled for a colonoscopy with propofol. Please follow written instructions given to you at your visit today.  Please pick up your prep kit at the pharmacy within the next 1-3 days. If you use inhalers (even only as needed), please bring them with you on the day of your procedure.  Your physician has requested that you go to the basement for the following lab work before leaving today: TSH

## 2012-02-20 ENCOUNTER — Other Ambulatory Visit: Payer: Self-pay | Admitting: Family Medicine

## 2012-02-23 ENCOUNTER — Encounter: Payer: Self-pay | Admitting: Internal Medicine

## 2012-02-23 ENCOUNTER — Ambulatory Visit (AMBULATORY_SURGERY_CENTER): Payer: BC Managed Care – PPO | Admitting: Internal Medicine

## 2012-02-23 VITALS — BP 191/99 | HR 85 | Temp 97.7°F | Resp 20 | Ht 67.0 in | Wt 166.0 lb

## 2012-02-23 DIAGNOSIS — K648 Other hemorrhoids: Secondary | ICD-10-CM

## 2012-02-23 DIAGNOSIS — D126 Benign neoplasm of colon, unspecified: Secondary | ICD-10-CM

## 2012-02-23 DIAGNOSIS — K52832 Lymphocytic colitis: Secondary | ICD-10-CM

## 2012-02-23 DIAGNOSIS — R197 Diarrhea, unspecified: Secondary | ICD-10-CM

## 2012-02-23 DIAGNOSIS — K625 Hemorrhage of anus and rectum: Secondary | ICD-10-CM

## 2012-02-23 DIAGNOSIS — K5289 Other specified noninfective gastroenteritis and colitis: Secondary | ICD-10-CM

## 2012-02-23 MED ORDER — SODIUM CHLORIDE 0.9 % IV SOLN: 500.0000 mL | INTRAVENOUS | Status: DC

## 2012-02-23 NOTE — Patient Instructions (Addendum)
Two polyps were removed. The large one might have been responsible for the diarrhea. I also took biopsies of the colon to look for microscopic colitis. I will let you know the biopsy results.  Internal hemorrhoids probably caused the bleeding (and/or the polyp).  You will need an examination of the large polyp site in about 6 months (sigmoidoscopy exam).  Thank you for choosing me and [facility].  Iva Boop, MD, FACG  YOU HAD AN ENDOSCOPIC PROCEDURE TODAY AT THE [facility]: Refer to the procedure report that was given to you for any specific questions about what was found during the examination.  If the procedure report does not answer your questions, please call your gastroenterologist to clarify.  If you requested that your care partner not be given the details of your procedure findings, then the procedure report has been included in a sealed envelope for you to review at your convenience later.  YOU SHOULD EXPECT: Some feelings of bloating in the abdomen. Passage of more gas than usual.  Walking can help get rid of the air that was put into your GI tract during the procedure and reduce the bloating. If you had a lower endoscopy (such as a colonoscopy or flexible sigmoidoscopy) you may notice spotting of blood in your stool or on the toilet paper. If you underwent a bowel prep for your procedure, then you may not have a normal bowel movement for a few days.  DIET: Your first meal following the procedure should be a light meal and then it is ok to progress to your normal diet.  A half-sandwich or bowl of soup is an example of a good first meal.  Heavy or fried foods are harder to digest and may make you feel nauseous or bloated.  Likewise meals heavy in dairy and vegetables can cause extra gas to form and this can also increase the bloating.  Drink plenty of fluids but you should avoid alcoholic beverages for 24 hours.  ACTIVITY: Your care partner should take you  home directly after the procedure.  You should plan to take it easy, moving slowly for the rest of the day.  You can resume normal activity the day after the procedure however you should NOT DRIVE or use heavy machinery for 24 hours (because of the sedation medicines used during the test).    SYMPTOMS TO REPORT IMMEDIATELY: A gastroenterologist can be reached at any hour.  During normal business hours, 8:30 AM to 5:00 PM Monday through Friday, call [phone].  After hours and on weekends, please call the GI answering service at [phone] who will take a message and have the physician on call contact you.   Following lower endoscopy (colonoscopy or flexible sigmoidoscopy):  Excessive amounts of blood in the stool  Significant tenderness or worsening of abdominal pains  Swelling of the abdomen that is new, acute  Fever of 100F or higher  FOLLOW UP: If any biopsies were taken you will be contacted by phone or by letter within the next 1-3 weeks.  Call your gastroenterologist if you have not heard about the biopsies in 3 weeks.  Our staff will call the home number listed on your records the next business day following your procedure to check on you and address any questions or concerns that you may have at that time regarding the information given to you following your procedure. This is a courtesy call and so if there is no answer at the home number  and we have not heard from you through the emergency physician on call, we will assume that you have returned to your regular daily activities without incident.  SIGNATURES/CONFIDENTIALITY: You and/or your care partner have signed paperwork which will be entered into your electronic medical record.  These signatures attest to the fact that that the information above on your After Visit Summary has been reviewed and is understood.  Full responsibility of the confidentiality of this discharge information lies with you and/or your care-partner.     HOLD ASPIRIN, ASPIRIN CONTAINING PRODUCTS, AND NSAIDS FOR 2 WEEKS

## 2012-02-23 NOTE — Progress Notes (Signed)
Propofol per [facility]. See scanned intra procedure report. ewm 

## 2012-02-23 NOTE — Progress Notes (Signed)
Patient did not experience any of the following events: a burn prior to discharge; a fall within the facility; wrong site/side/patient/procedure/implant event; or a hospital transfer or hospital admission upon discharge from the facility. (G8907) Patient did not have preoperative order for IV antibiotic SSI prophylaxis. (G8918)  

## 2012-02-23 NOTE — Op Note (Signed)
[facility] [address] [place], [place]  [address]  COLONOSCOPY PROCEDURE REPORT  PATIENT:  Samantha Davidson, Samantha Davidson  MR#:  [id number] BIRTHDATE:  1945/12/09, 65 yrs. old  GENDER:  female ENDOSCOPIST:  Iva Boop, MD, [facility] REF. BY:  Loreen Freud, DO PROCEDURE DATE:  02/23/2012 PROCEDURE:  Colonoscopy with biopsy and snare polypectomy ASA CLASS:  Class II INDICATIONS:  unexplained diarrhea, rectal bleeding MEDICATIONS:   These medications were titrated to patient response per physician's verbal order, MAC sedation, administered by CRNA, propofol (Diprivan) 430 mg IV  DESCRIPTION OF PROCEDURE:   After the risks benefits and alternatives of the procedure were thoroughly explained, informed consent was obtained.  Digital rectal exam was performed and revealed no abnormalities.   The LB [id number]-[id number] endoscope was introduced through the anus and advanced to the terminal ileum which was intubated for a short distance, without limitations. The quality of the prep was excellent, using MoviPrep.  The instrument was then slowly withdrawn as the colon was fully examined. <<PROCEDUREIMAGES>>  FINDINGS:  A sessile polyp was found in the rectum. It was 3 cm in size. Piecemeal polypectomy, small area of sessile attachment. Proximal rectum. Polyp was snared, then cauterized with monopolar cautery. Retrieval was successful. A pedunculated polyp was found at the hepatic flexure. It was 1 cm in size. Polyp was snared, then cauterized with monopolar cautery. Retrieval was successful. This was otherwise a normal examination of the colon. Random biopsies were obtained and sent to pathology.  The terminal ileum was inspected and also appeared normal. Retroflexed views in the rectum revealed internal hemorrhoids.    The time to cecum = 4:30 minutes. The scope was then withdrawn in 17:12 minutes from the cecum and the procedure completed. COMPLICATIONS:  None ENDOSCOPIC  IMPRESSION: 1) 3 cm sessile polyp in the rectum - removed 2) 1 cm pedunculated polyp at the hepatic flexure - removed 3) Internal hemorrhoids 4) Otherwise normal examination, including terminal ileum RECOMMENDATIONS: 1) Hold aspirin, aspirin products, and anti-inflammatory medication for 2 weeks.  REPEAT EXAM:  In for Colonoscopy, pending biopsy results. likely to have sigmoidoscopy to check large polypectomy site in 6 months  Iva Boop, MD, [facility]  CC:  Lelon Perla, DO and The Patient  n. eSIGNED:   Iva Boop at 02/23/2012 09:57 AM  [name], [phone]

## 2012-02-24 ENCOUNTER — Telehealth: Payer: Self-pay

## 2012-02-24 NOTE — Telephone Encounter (Signed)
  Follow up Call-  Call back number 02/23/2012  Post procedure Call Back phone  # [phone]  Permission to leave phone message Yes     Patient questions:  Do you have a fever, pain , or abdominal swelling? no Pain Score  0 *  Have you tolerated food without any problems? yes  Have you been able to return to your normal activities? yes  Do you have any questions about your discharge instructions: Diet   no Medications  no Follow up visit  no  Do you have questions or concerns about your Care? no  Actions: * If pain score is 4 or above: No action needed, pain <4.

## 2012-02-29 ENCOUNTER — Encounter: Payer: Self-pay | Admitting: Internal Medicine

## 2012-02-29 DIAGNOSIS — Z860100 Personal history of colon polyps, unspecified: Secondary | ICD-10-CM | POA: Insufficient documentation

## 2012-02-29 DIAGNOSIS — K52832 Lymphocytic colitis: Secondary | ICD-10-CM | POA: Insufficient documentation

## 2012-03-02 MED ORDER — BUDESONIDE 3 MG PO CP24: 9.0000 mg | ORAL_CAPSULE | ORAL | Status: DC

## 2012-03-02 NOTE — Assessment & Plan Note (Signed)
Start entocort and see me in 6 weeks

## 2012-03-02 NOTE — Progress Notes (Signed)
Quick Note:  I prescribed entocort EC 9 mg daily #90 1 refill  This should help within 2 weeks and hopefully eliminate diarrhea in 6-8 weeks.  Have her see me in 6 weeks and also ok to use Imodium AD prn also ______

## 2012-03-02 NOTE — Addendum Note (Signed)
Addended by: Stan Head E on: 03/02/2012 01:03 PM   Modules accepted: Orders

## 2012-03-15 ENCOUNTER — Encounter: Payer: Self-pay | Admitting: Family Medicine

## 2012-03-15 ENCOUNTER — Ambulatory Visit (INDEPENDENT_AMBULATORY_CARE_PROVIDER_SITE_OTHER): Payer: Medicare Other | Admitting: Family Medicine

## 2012-03-15 VITALS — BP 140/86 | HR 65 | Temp 98.7°F | Ht 64.0 in | Wt 168.6 lb

## 2012-03-15 DIAGNOSIS — Z0000 Encounter for general adult medical examination without abnormal findings: Secondary | ICD-10-CM | POA: Diagnosis not present

## 2012-03-15 DIAGNOSIS — I10 Essential (primary) hypertension: Secondary | ICD-10-CM | POA: Diagnosis not present

## 2012-03-15 LAB — POCT URINALYSIS DIPSTICK
Bilirubin, UA: NEGATIVE
Blood, UA: NEGATIVE
Glucose, UA: NEGATIVE
Ketones, UA: NEGATIVE
Leukocytes, UA: NEGATIVE
Nitrite, UA: NEGATIVE
Protein, UA: NEGATIVE
Spec Grav, UA: <=1.005
Urobilinogen, UA: 0.2
pH, UA: 6

## 2012-03-15 LAB — BASIC METABOLIC PANEL
BUN: 16 mg/dL (ref 6–23)
CO2: 29 mEq/L (ref 19–32)
Calcium: 9.4 mg/dL (ref 8.4–10.5)
Chloride: 101 mEq/L (ref 96–112)
Creatinine, Ser: 0.7 mg/dL (ref 0.4–1.2)
GFR: 90.76 mL/min (ref >60.00)
Glucose, Bld: 105 mg/dL — ABNORMAL HIGH (ref 70–99)
Potassium: 3.4 mEq/L — ABNORMAL LOW (ref 3.5–5.1)
Sodium: 138 mEq/L (ref 135–145)

## 2012-03-15 LAB — CBC WITH DIFFERENTIAL/PLATELET
Basophils Absolute: 0 10*3/uL (ref 0.0–0.1)
Basophils Relative: 0.4 % (ref 0.0–3.0)
Eosinophils Absolute: 0.1 10*3/uL (ref 0.0–0.7)
Eosinophils Relative: 1.1 % (ref 0.0–5.0)
HCT: 38 % (ref 36.0–46.0)
Hemoglobin: 12.8 g/dL (ref 12.0–15.0)
Lymphocytes Relative: 26.9 % (ref 12.0–46.0)
Lymphs Abs: 1.7 10*3/uL (ref 0.7–4.0)
MCHC: 33.7 g/dL (ref 30.0–36.0)
MCV: 100.7 fl — ABNORMAL HIGH (ref 78.0–100.0)
Monocytes Absolute: 0.5 10*3/uL (ref 0.1–1.0)
Monocytes Relative: 8.4 % (ref 3.0–12.0)
Neutro Abs: 4.1 10*3/uL (ref 1.4–7.7)
Neutrophils Relative %: 63.2 % (ref 43.0–77.0)
Platelets: 210 10*3/uL (ref 150.0–400.0)
RBC: 3.78 Mil/uL — ABNORMAL LOW (ref 3.87–5.11)
RDW: 12.8 % (ref 11.5–14.6)
WBC: 6.5 10*3/uL (ref 4.5–10.5)

## 2012-03-15 LAB — HEPATIC FUNCTION PANEL
ALT: 16 U/L (ref 0–35)
AST: 21 U/L (ref 0–37)
Albumin: 4.3 g/dL (ref 3.5–5.2)
Alkaline Phosphatase: 55 U/L (ref 39–117)
Bilirubin, Direct: 0.1 mg/dL (ref 0.0–0.3)
Total Bilirubin: 0.8 mg/dL (ref 0.3–1.2)
Total Protein: 6.7 g/dL (ref 6.0–8.3)

## 2012-03-15 LAB — LIPID PANEL
Cholesterol: 272 mg/dL — ABNORMAL HIGH (ref 0–200)
HDL: 141.4 mg/dL (ref >39.00)
Total CHOL/HDL Ratio: 2
Triglycerides: 61 mg/dL (ref 0.0–149.0)
VLDL: 12.2 mg/dL (ref 0.0–40.0)

## 2012-03-15 LAB — LDL CHOLESTEROL, DIRECT: Direct LDL: 114.3 mg/dL

## 2012-03-15 NOTE — Patient Instructions (Addendum)
Preventive Care for Adults, Female A healthy lifestyle and preventive care can promote health and wellness. Preventive health guidelines for women include the following key practices.  A routine yearly physical is a good way to check with your caregiver about your health and preventive screening. It is a chance to share any concerns and updates on your health, and to receive a thorough exam.   Visit your dentist for a routine exam and preventive care every 6 months. Brush your teeth twice a day and floss once a day. Good oral hygiene prevents tooth decay and gum disease.   The frequency of eye exams is based on your age, health, family medical history, use of contact lenses, and other factors. Follow your caregiver's recommendations for frequency of eye exams.   Eat a healthy diet. Foods like vegetables, fruits, whole grains, low-fat dairy products, and lean protein foods contain the nutrients you need without too many calories. Decrease your intake of foods high in solid fats, added sugars, and salt. Eat the right amount of calories for you.Get information about a proper diet from your caregiver, if necessary.   Regular physical exercise is one of the most important things you can do for your health. Most adults should get at least 150 minutes of moderate-intensity exercise (any activity that increases your heart rate and causes you to sweat) each week. In addition, most adults need muscle-strengthening exercises on 2 or more days a week.   Maintain a healthy weight. The body mass index (BMI) is a screening tool to identify possible weight problems. It provides an estimate of body fat based on height and weight. Your caregiver can help determine your BMI, and can help you achieve or maintain a healthy weight.For adults 20 years and older:   A BMI below 18.5 is considered underweight.   A BMI of 18.5 to 24.9 is normal.   A BMI of 25 to 29.9 is considered overweight.   A BMI of 30 and above is  considered obese.   Maintain normal blood lipids and cholesterol levels by exercising and minimizing your intake of saturated fat. Eat a balanced diet with plenty of fruit and vegetables. Blood tests for lipids and cholesterol should begin at age 20 and be repeated every 5 years. If your lipid or cholesterol levels are high, you are over 50, or you are at high risk for heart disease, you may need your cholesterol levels checked more frequently.Ongoing high lipid and cholesterol levels should be treated with medicines if diet and exercise are not effective.   If you smoke, find out from your caregiver how to quit. If you do not use tobacco, do not start.   If you are pregnant, do not drink alcohol. If you are breastfeeding, be very cautious about drinking alcohol. If you are not pregnant and choose to drink alcohol, do not exceed 1 drink per day. One drink is considered to be 12 ounces (355 mL) of beer, 5 ounces (148 mL) of wine, or 1.5 ounces (44 mL) of liquor.   Avoid use of street drugs. Do not share needles with anyone. Ask for help if you need support or instructions about stopping the use of drugs.   High blood pressure causes heart disease and increases the risk of stroke. Your blood pressure should be checked at least every 1 to 2 years. Ongoing high blood pressure should be treated with medicines if weight loss and exercise are not effective.   If you are 55 to 65   years old, ask your caregiver if you should take aspirin to prevent strokes.   Diabetes screening involves taking a blood sample to check your fasting blood sugar level. This should be done once every 3 years, after age 45, if you are within normal weight and without risk factors for diabetes. Testing should be considered at a younger age or be carried out more frequently if you are overweight and have at least 1 risk factor for diabetes.   Breast cancer screening is essential preventive care for women. You should practice "breast  self-awareness." This means understanding the normal appearance and feel of your breasts and may include breast self-examination. Any changes detected, no matter how small, should be reported to a caregiver. Women in their 20s and 30s should have a clinical breast exam (CBE) by a caregiver as part of a regular health exam every 1 to 3 years. After age 40, women should have a CBE every year. Starting at age 40, women should consider having a mammography (breast X-ray test) every year. Women who have a family history of breast cancer should talk to their caregiver about genetic screening. Women at a high risk of breast cancer should talk to their caregivers about having magnetic resonance imaging (MRI) and a mammography every year.   The Pap test is a screening test for cervical cancer. A Pap test can show cell changes on the cervix that might become cervical cancer if left untreated. A Pap test is a procedure in which cells are obtained and examined from the lower end of the uterus (cervix).   Women should have a Pap test starting at age 21.   Between ages 21 and 29, Pap tests should be repeated every 2 years.   Beginning at age 30, you should have a Pap test every 3 years as long as the past 3 Pap tests have been normal.   Some women have medical problems that increase the chance of getting cervical cancer. Talk to your caregiver about these problems. It is especially important to talk to your caregiver if a new problem develops soon after your last Pap test. In these cases, your caregiver may recommend more frequent screening and Pap tests.   The above recommendations are the same for women who have or have not gotten the vaccine for human papillomavirus (HPV).   If you had a hysterectomy for a problem that was not cancer or a condition that could lead to cancer, then you no longer need Pap tests. Even if you no longer need a Pap test, a regular exam is a good idea to make sure no other problems are  starting.   If you are between ages 65 and 70, and you have had normal Pap tests going back 10 years, you no longer need Pap tests. Even if you no longer need a Pap test, a regular exam is a good idea to make sure no other problems are starting.   If you have had past treatment for cervical cancer or a condition that could lead to cancer, you need Pap tests and screening for cancer for at least 20 years after your treatment.   If Pap tests have been discontinued, risk factors (such as a new sexual partner) need to be reassessed to determine if screening should be resumed.   The HPV test is an additional test that may be used for cervical cancer screening. The HPV test looks for the virus that can cause the cell changes on the cervix.   The cells collected during the Pap test can be tested for HPV. The HPV test could be used to screen women aged 30 years and older, and should be used in women of any age who have unclear Pap test results. After the age of 30, women should have HPV testing at the same frequency as a Pap test.   Colorectal cancer can be detected and often prevented. Most routine colorectal cancer screening begins at the age of 50 and continues through age 75. However, your caregiver may recommend screening at an earlier age if you have risk factors for colon cancer. On a yearly basis, your caregiver may provide home test kits to check for hidden blood in the stool. Use of a small camera at the end of a tube, to directly examine the colon (sigmoidoscopy or colonoscopy), can detect the earliest forms of colorectal cancer. Talk to your caregiver about this at age 50, when routine screening begins. Direct examination of the colon should be repeated every 5 to 10 years through age 75, unless early forms of pre-cancerous polyps or small growths are found.   Hepatitis C blood testing is recommended for all people born from 1945 through 1965 and any individual with known risks for hepatitis C.    Practice safe sex. Use condoms and avoid high-risk sexual practices to reduce the spread of sexually transmitted infections (STIs). STIs include gonorrhea, chlamydia, syphilis, trichomonas, herpes, HPV, and human immunodeficiency virus (HIV). Herpes, HIV, and HPV are viral illnesses that have no cure. They can result in disability, cancer, and death. Sexually active women aged 25 and younger should be checked for chlamydia. Older women with new or multiple partners should also be tested for chlamydia. Testing for other STIs is recommended if you are sexually active and at increased risk.   Osteoporosis is a disease in which the bones lose minerals and strength with aging. This can result in serious bone fractures. The risk of osteoporosis can be identified using a bone density scan. Women ages 65 and over and women at risk for fractures or osteoporosis should discuss screening with their caregivers. Ask your caregiver whether you should take a calcium supplement or vitamin D to reduce the rate of osteoporosis.   Menopause can be associated with physical symptoms and risks. Hormone replacement therapy is available to decrease symptoms and risks. You should talk to your caregiver about whether hormone replacement therapy is right for you.   Use sunscreen with sun protection factor (SPF) of 30 or more. Apply sunscreen liberally and repeatedly throughout the day. You should seek shade when your shadow is shorter than you. Protect yourself by wearing long sleeves, pants, a wide-brimmed hat, and sunglasses year round, whenever you are outdoors.   Once a month, do a whole body skin exam, using a mirror to look at the skin on your back. Notify your caregiver of new moles, moles that have irregular borders, moles that are larger than a pencil eraser, or moles that have changed in shape or color.   Stay current with required immunizations.   Influenza. You need a dose every fall (or winter). The composition of  the flu vaccine changes each year, so being vaccinated once is not enough.   Pneumococcal polysaccharide. You need 1 to 2 doses if you smoke cigarettes or if you have certain chronic medical conditions. You need 1 dose at age 65 (or older) if you have never been vaccinated.   Tetanus, diphtheria, pertussis (Tdap, Td). Get 1 dose of   Tdap vaccine if you are younger than age 65, are over 65 and have contact with an infant, are a healthcare worker, are pregnant, or simply want to be protected from whooping cough. After that, you need a Td booster dose every 10 years. Consult your caregiver if you have not had at least 3 tetanus and diphtheria-containing shots sometime in your life or have a deep or dirty wound.   HPV. You need this vaccine if you are a woman age 26 or younger. The vaccine is given in 3 doses over 6 months.   Measles, mumps, rubella (MMR). You need at least 1 dose of MMR if you were born in 1957 or later. You may also need a second dose.   Meningococcal. If you are age 19 to 21 and a first-year college student living in a residence hall, or have one of several medical conditions, you need to get vaccinated against meningococcal disease. You may also need additional booster doses.   Zoster (shingles). If you are age 60 or older, you should get this vaccine.   Varicella (chickenpox). If you have never had chickenpox or you were vaccinated but received only 1 dose, talk to your caregiver to find out if you need this vaccine.   Hepatitis A. You need this vaccine if you have a specific risk factor for hepatitis A virus infection or you simply wish to be protected from this disease. The vaccine is usually given as 2 doses, 6 to 18 months apart.   Hepatitis B. You need this vaccine if you have a specific risk factor for hepatitis B virus infection or you simply wish to be protected from this disease. The vaccine is given in 3 doses, usually over 6 months.  Preventive Services /  Frequency Ages 19 to 39  Blood pressure check.** / Every 1 to 2 years.   Lipid and cholesterol check.** / Every 5 years beginning at age 20.   Clinical breast exam.** / Every 3 years for women in their 20s and 30s.   Pap test.** / Every 2 years from ages 21 through 29. Every 3 years starting at age 30 through age 65 or 70 with a history of 3 consecutive normal Pap tests.   HPV screening.** / Every 3 years from ages 30 through ages 65 to 70 with a history of 3 consecutive normal Pap tests.   Hepatitis C blood test.** / For any individual with known risks for hepatitis C.   Skin self-exam. / Monthly.   Influenza immunization.** / Every year.   Pneumococcal polysaccharide immunization.** / 1 to 2 doses if you smoke cigarettes or if you have certain chronic medical conditions.   Tetanus, diphtheria, pertussis (Tdap, Td) immunization. / A one-time dose of Tdap vaccine. After that, you need a Td booster dose every 10 years.   HPV immunization. / 3 doses over 6 months, if you are 26 and younger.   Measles, mumps, rubella (MMR) immunization. / You need at least 1 dose of MMR if you were born in 1957 or later. You may also need a second dose.   Meningococcal immunization. / 1 dose if you are age 19 to 21 and a first-year college student living in a residence hall, or have one of several medical conditions, you need to get vaccinated against meningococcal disease. You may also need additional booster doses.   Varicella immunization.** / Consult your caregiver.   Hepatitis A immunization.** / Consult your caregiver. 2 doses, 6 to 18 months   apart.   Hepatitis B immunization.** / Consult your caregiver. 3 doses usually over 6 months.  Ages 40 to 64  Blood pressure check.** / Every 1 to 2 years.   Lipid and cholesterol check.** / Every 5 years beginning at age 20.   Clinical breast exam.** / Every year after age 40.   Mammogram.** / Every year beginning at age 40 and continuing for as  long as you are in good health. Consult with your caregiver.   Pap test.** / Every 3 years starting at age 30 through age 65 or 70 with a history of 3 consecutive normal Pap tests.   HPV screening.** / Every 3 years from ages 30 through ages 65 to 70 with a history of 3 consecutive normal Pap tests.   Fecal occult blood test (FOBT) of stool. / Every year beginning at age 50 and continuing until age 75. You may not need to do this test if you get a colonoscopy every 10 years.   Flexible sigmoidoscopy or colonoscopy.** / Every 5 years for a flexible sigmoidoscopy or every 10 years for a colonoscopy beginning at age 50 and continuing until age 75.   Hepatitis C blood test.** / For all people born from 1945 through 1965 and any individual with known risks for hepatitis C.   Skin self-exam. / Monthly.   Influenza immunization.** / Every year.   Pneumococcal polysaccharide immunization.** / 1 to 2 doses if you smoke cigarettes or if you have certain chronic medical conditions.   Tetanus, diphtheria, pertussis (Tdap, Td) immunization.** / A one-time dose of Tdap vaccine. After that, you need a Td booster dose every 10 years.   Measles, mumps, rubella (MMR) immunization. / You need at least 1 dose of MMR if you were born in 1957 or later. You may also need a second dose.   Varicella immunization.** / Consult your caregiver.   Meningococcal immunization.** / Consult your caregiver.   Hepatitis A immunization.** / Consult your caregiver. 2 doses, 6 to 18 months apart.   Hepatitis B immunization.** / Consult your caregiver. 3 doses, usually over 6 months.  Ages 65 and over  Blood pressure check.** / Every 1 to 2 years.   Lipid and cholesterol check.** / Every 5 years beginning at age 20.   Clinical breast exam.** / Every year after age 40.   Mammogram.** / Every year beginning at age 40 and continuing for as long as you are in good health. Consult with your caregiver.   Pap test.** /  Every 3 years starting at age 30 through age 65 or 70 with a 3 consecutive normal Pap tests. Testing can be stopped between 65 and 70 with 3 consecutive normal Pap tests and no abnormal Pap or HPV tests in the past 10 years.   HPV screening.** / Every 3 years from ages 30 through ages 65 or 70 with a history of 3 consecutive normal Pap tests. Testing can be stopped between 65 and 70 with 3 consecutive normal Pap tests and no abnormal Pap or HPV tests in the past 10 years.   Fecal occult blood test (FOBT) of stool. / Every year beginning at age 50 and continuing until age 75. You may not need to do this test if you get a colonoscopy every 10 years.   Flexible sigmoidoscopy or colonoscopy.** / Every 5 years for a flexible sigmoidoscopy or every 10 years for a colonoscopy beginning at age 50 and continuing until age 75.   Hepatitis   C blood test.** / For all people born from 1945 through 1965 and any individual with known risks for hepatitis C.   Osteoporosis screening.** / A one-time screening for women ages 65 and over and women at risk for fractures or osteoporosis.   Skin self-exam. / Monthly.   Influenza immunization.** / Every year.   Pneumococcal polysaccharide immunization.** / 1 dose at age 65 (or older) if you have never been vaccinated.   Tetanus, diphtheria, pertussis (Tdap, Td) immunization. / A one-time dose of Tdap vaccine if you are over 65 and have contact with an infant, are a healthcare worker, or simply want to be protected from whooping cough. After that, you need a Td booster dose every 10 years.   Varicella immunization.** / Consult your caregiver.   Meningococcal immunization.** / Consult your caregiver.   Hepatitis A immunization.** / Consult your caregiver. 2 doses, 6 to 18 months apart.   Hepatitis B immunization.** / Check with your caregiver. 3 doses, usually over 6 months.  ** Family history and personal history of risk and conditions may change your caregiver's  recommendations. Document Released: 09/21/2001 Document Revised: 07/15/2011 Document Reviewed: 12/21/2010 [employer] Patient Information 2012 [employer] 

## 2012-03-15 NOTE — Assessment & Plan Note (Signed)
Stable con't meds 

## 2012-03-15 NOTE — Progress Notes (Signed)
Subjective:    Samantha Davidson is a 66 y.o. female who presents for Medicare Annual/Subsequent preventive examination.  Preventive Screening-Counseling & Management  Tobacco History  Smoking status  . Former Smoker  Smokeless tobacco  . Never Used  Comment: only when stressed     Problems Prior to Visit 1.   Current Problems (verified) Patient Active Problem List  Diagnosis  . Elevated BP  . Depression  . Insomnia  . Asthma  . Personal history of adenomatous colonic polyps  . Lymphocytic colitis  . Medicare annual wellness visit, subsequent    Medications Prior to Visit Current Outpatient Prescriptions on File Prior to Visit  Medication Sig Dispense Refill  . acetaminophen (TYLENOL) 500 MG tablet Take 500 mg by mouth every 6 (six) hours as needed.      . budesonide (ENTOCORT EC) 3 MG 24 hr capsule Take 3 capsules (9 mg total) by mouth every morning.  90 capsule  1  . buPROPion (WELLBUTRIN XL) 300 MG 24 hr tablet Take 1 tablet (300 mg total) by mouth daily.  30 tablet  11  . Cholecalciferol (VITAMIN D) 2000 UNITS CAPS Take 2 capsules by mouth daily.        Marland Kitchen COREG CR 20 MG 24 hr capsule TAKE 1 CAPSULE (20 MG TOTAL) BY MOUTH DAILY.  30 capsule  2  . EPINEPHrine (EPIPEN) 0.3 mg/0.3 mL DEVI Inject 0.3 mLs (0.3 mg total) into the muscle once.  1 Device  1  . fexofenadine (ALLEGRA) 180 MG tablet Take 1 tablet (180 mg total) by mouth daily.  30 tablet  11  . Fluticasone-Salmeterol (ADVAIR DISKUS) 250-50 MCG/DOSE AEPB Inhale 1 puff into the lungs 2 (two) times daily.  60 each  5  . Multiple Vitamin (MULTIVITAMIN PO) Take 1 tablet by mouth daily.        . traZODone (DESYREL) 50 MG tablet Take 1/4 tab  30 tablet  2  . aspirin 81 MG EC tablet Take 81 mg by mouth daily.        Marland Kitchen omeprazole (PRILOSEC) 40 MG capsule Take 1 capsule (40 mg total) by mouth daily.  30 capsule  3    Current Medications (verified) Current Outpatient Prescriptions  Medication Sig Dispense Refill   . acetaminophen (TYLENOL) 500 MG tablet Take 500 mg by mouth every 6 (six) hours as needed.      . budesonide (ENTOCORT EC) 3 MG 24 hr capsule Take 3 capsules (9 mg total) by mouth every morning.  90 capsule  1  . buPROPion (WELLBUTRIN XL) 300 MG 24 hr tablet Take 1 tablet (300 mg total) by mouth daily.  30 tablet  11  . Cholecalciferol (VITAMIN D) 2000 UNITS CAPS Take 2 capsules by mouth daily.        Marland Kitchen COREG CR 20 MG 24 hr capsule TAKE 1 CAPSULE (20 MG TOTAL) BY MOUTH DAILY.  30 capsule  2  . EPINEPHrine (EPIPEN) 0.3 mg/0.3 mL DEVI Inject 0.3 mLs (0.3 mg total) into the muscle once.  1 Device  1  . fexofenadine (ALLEGRA) 180 MG tablet Take 1 tablet (180 mg total) by mouth daily.  30 tablet  11  . Fluticasone-Salmeterol (ADVAIR DISKUS) 250-50 MCG/DOSE AEPB Inhale 1 puff into the lungs 2 (two) times daily.  60 each  5  . Multiple Vitamin (MULTIVITAMIN PO) Take 1 tablet by mouth daily.        . traZODone (DESYREL) 50 MG tablet Take 1/4 tab  30 tablet  2  .  aspirin 81 MG EC tablet Take 81 mg by mouth daily.        Marland Kitchen omeprazole (PRILOSEC) 40 MG capsule Take 1 capsule (40 mg total) by mouth daily.  30 capsule  3     Allergies (verified) Codeine   PAST HISTORY  Family History Family History  Problem Relation Age of Onset  . Arthritis Maternal Grandmother   . Lung cancer Mother   . Heart disease Mother   . Mental illness Mother   . Cancer Father     Social History History  Substance Use Topics  . Smoking status: Former Games developer  . Smokeless tobacco: Never Used   Comment: only when stressed  . Alcohol Use: Yes     Are there smokers in your home (other than you)? No  Risk Factors Current exercise habits: riding bike and walking  Dietary issues discussed: na   Cardiac risk factors: hypertension and sedentary lifestyle.  Depression Screen (Note: if answer to either of the following is "Yes", a more complete depression screening is indicated)   Over the past two weeks, have you  felt down, depressed or hopeless? No  Over the past two weeks, have you felt little interest or pleasure in doing things? No  Have you lost interest or pleasure in daily life? No  Do you often feel hopeless? No  Do you cry easily over simple problems? No  Activities of Daily Living In your present state of health, do you have any difficulty performing the following activities?:  Driving? No Managing money?  No Feeding yourself? No Getting from bed to chair? No Climbing a flight of stairs? No Preparing food and eating?: No Bathing or showering? No Getting dressed: No Getting to the toilet? No Using the toilet:No Moving around from place to place: No In the past year have you fallen or had a near fall?:No   Are you sexually active?  Yes  Do you have more than one partner?  No  Hearing Difficulties: No Do you often ask people to speak up or repeat themselves? No Do you experience ringing or noises in your ears? No Do you have difficulty understanding soft or whispered voices? No   Do you feel that you have a problem with memory? No  Do you often misplace items? No  Do you feel safe at home?  Yes  Cognitive Testing  Alert? Yes  Normal Appearance?Yes  Oriented to person? Yes  Place? Yes   Time? Yes  Recall of three objects?  Yes  Can perform simple calculations? Yes  Displays appropriate judgment?Yes  Can read the correct time from a watch face?Yes   Advanced Directives have been discussed with the patient? Yes  List the Names of Other Physician/Practitioners you currently use: 1.  GI---[facility]essener 2 ortho-- [facility] 3. oph-- [facility] Indicate any recent Medical Services you may have received from other than [facility] providers in the past year (date may be approximate).   There is no immunization history on file for this patient.  Screening Tests Health Maintenance  Topic Date Due  . Tetanus/tdap  03/23/1966  . Zostavax  03/24/2007  . Influenza Vaccine  05/09/2012  .  Mammogram  09/01/2012  . Colonoscopy  02/22/2013  . Pap Smear  07/09/2013    All answers were reviewed with the patient and necessary referrals were made:  Loreen Freud, DO   03/15/2012   History reviewed: allergies, current medications, past family history, past medical history, past social history, past surgical history and  problem list  Review of Systems  Review of Systems  Constitutional: Negative for activity change, appetite change and fatigue.  HENT: Negative for hearing loss, congestion, tinnitus and ear discharge.   Eyes: Negative for visual disturbance (see optho q1y -- vision corrected to 20/20 with glasses).  Respiratory: Negative for cough, chest tightness and shortness of breath.   Cardiovascular: Negative for chest pain, palpitations and leg swelling.  Gastrointestinal: Negative for abdominal pain, diarrhea, constipation and abdominal distention.  Genitourinary: Negative for urgency, frequency, decreased urine volume and difficulty urinating.  Musculoskeletal: Negative for back pain, arthralgias and gait problem.  Skin: Negative for color change, pallor and rash.  Neurological: Negative for dizziness, light-headedness, numbness and headaches.  Hematological: Negative for adenopathy. Does not bruise/bleed easily.  Psychiatric/Behavioral: Negative for suicidal ideas, confusion, sleep disturbance, self-injury, dysphoric mood, decreased concentration and agitation.  Pt is able to read and write and can do all ADLs No risk for falling No abuse/ violence in home      Objective:     Vision by Snellen chart: right ZOX:WRUE Body mass index is 28.94 kg/(m^2). BP 140/86  Pulse 65  Temp 98.7 F (37.1 C) (Oral)  Ht 5\' 4"  (1.626 m)  Wt 168 lb 9.6 oz (76.476 kg)  BMI 28.94 kg/m2  SpO2 99% BP 140/86  Pulse 65  Temp 98.7 F (37.1 C) (Oral)  Ht 5\' 4"  (1.626 m)  Wt 168 lb 9.6 oz (76.476 kg)  BMI 28.94 kg/m2  SpO2 99%  General Appearance:    Alert, cooperative, no  distress, appears stated age  Head:    Normocephalic, without obvious abnormality, atraumatic  Eyes:    PERRL, conjunctiva/corneas clear, EOM's intact, fundi    benign, both eyes  Ears:    Normal TM's and external ear canals, both ears  Nose:   Nares normal, septum midline, mucosa normal, no drainage    or sinus tenderness  Throat:   Lips, mucosa, and tongue normal; teeth and gums normal  Neck:   Supple, symmetrical, trachea midline, no adenopathy;    thyroid:  no enlargement/tenderness/nodules; no carotid   bruit or JVD  Back:     Symmetric, no curvature, ROM normal, no CVA tenderness  Lungs:     Clear to auscultation bilaterally, respirations unlabored  Chest Wall:    No tenderness or deformity   Heart:    Regular rate and rhythm, S1 and S2 normal, no murmur, rub   or gallop  Breast Exam:    No tenderness, masses, or nipple abnormality  Abdomen:     Soft, non-tender, bowel sounds active all four quadrants,    no masses, no organomegaly  Genitalia:    deferred  Rectal:    deferred     Extremities:   Extremities normal, atraumatic, no cyanosis or edema  Pulses:   2+ and symmetric all extremities  Skin:   Skin color, texture, turgor normal, no rashes or lesions  Lymph nodes:   Cervical, supraclavicular, and axillary nodes normal  Neurologic:   CNII-XII intact, normal strength, sensation and reflexes    throughout     BP 140/86  Pulse 65  Temp 98.7 F (37.1 C) (Oral)  Ht 5\' 4"  (1.626 m)  Wt 168 lb 9.6 oz (76.476 kg)  BMI 28.94 kg/m2  SpO2 99% Assessment:    cpe     Plan:     During the course of the visit the patient was educated and counseled about appropriate screening and preventive services including:  Pneumococcal vaccine   Influenza vaccine  Screening mammography  Bone densitometry screening  Colorectal cancer screening  Advanced directives: has an advanced directive - a copy HAS NOT been provided.  Diet review for nutrition referral? Yes ____  Not  Indicated __x__   Patient Instructions (the written plan) was given to the patient.  Medicare Attestation I have personally reviewed: The patient's medical and social history Their use of alcohol, tobacco or illicit drugs Their current medications and supplements The patient's functional ability including ADLs,fall risks, home safety risks, cognitive, and hearing and visual impairment Diet and physical activities Evidence for depression or mood disorders  The patient's weight, height, BMI, and visual acuity have been recorded in the chart.  I have made referrals, counseling, and provided education to the patient based on review of the above and I have provided the patient with a written personalized care plan for preventive services.     Loreen Freud, DO   03/15/2012

## 2012-03-15 NOTE — Assessment & Plan Note (Signed)
con't meds 

## 2012-04-11 ENCOUNTER — Ambulatory Visit (INDEPENDENT_AMBULATORY_CARE_PROVIDER_SITE_OTHER): Payer: BLUE CROSS/BLUE SHIELD | Admitting: Internal Medicine

## 2012-04-11 ENCOUNTER — Encounter: Payer: Self-pay | Admitting: Internal Medicine

## 2012-04-11 VITALS — BP 152/66 | HR 88 | Ht 63.0 in | Wt 171.0 lb

## 2012-04-11 DIAGNOSIS — D129 Benign neoplasm of anus and anal canal: Secondary | ICD-10-CM | POA: Diagnosis not present

## 2012-04-11 DIAGNOSIS — K52832 Lymphocytic colitis: Secondary | ICD-10-CM

## 2012-04-11 DIAGNOSIS — K5289 Other specified noninfective gastroenteritis and colitis: Secondary | ICD-10-CM | POA: Diagnosis not present

## 2012-04-11 DIAGNOSIS — L659 Nonscarring hair loss, unspecified: Secondary | ICD-10-CM

## 2012-04-11 DIAGNOSIS — D128 Benign neoplasm of rectum: Secondary | ICD-10-CM

## 2012-04-11 NOTE — Patient Instructions (Addendum)
Per Dr. Leone Payor stop your Entocort at this time.  Come back and see [place] as needed.  Call us if your symptoms return.  Thank you for choosing me and [facility].  Iva Boop, M.D., [facility]

## 2012-04-11 NOTE — Progress Notes (Signed)
Patient ID: Samantha Davidson, female   DOB: [birth year], 66 y.o.   MRN: [id number]  Patient is here for followup, mainly for lymphocytic colitis.  History of present illness:  Her lymphocytic colitis symptoms are completely resolved for the past month, she has been on budesonide 9 mg daily for 6 weeks or so. About 3 weeks ago she noted her hair started within out. 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 This will need close followup with flexible sigmoidoscopy in 6 months. She tells me her potassium was low, it was 3.4 and a followup visit on August 27.  She is otherwise well at this time.  Medications, allergies, past medical history, past surgical history, family history and social history are reviewed and updated in the EMR.  Assessment and plan:  1. Lymphocytic colitis   2. Benign neoplasm of rectum and anal canal   3. Hair thinning    1. She will stop the Entocort since she is in remission. I explained that this is often a self-limited problem but it could recur. If she has persistent diarrhea she is to call back. We will decide on a course of treatment at that time to see if it is worth trying the Entocort again given that it may have caused hair loss or thinning. If it did, I would expect the hair thinning to resolve. 2. Plan for a followup flexible sigmoidoscopy about 6 months at her colonoscopy to ensure complete removal of the rectal tubulovillous adenoma. Note that this could of been causing some of her diarrhea symptoms. She may not even eat sedation for that   I appreciate the opportunity to care for this patient.   CC: Loreen Freud, DO

## 2012-04-28 ENCOUNTER — Telehealth: Payer: Self-pay | Admitting: Family Medicine

## 2012-04-28 NOTE — Telephone Encounter (Signed)
Does she have a formulary?

## 2012-04-28 NOTE — Telephone Encounter (Signed)
Discussed with patient and she said she had been having some hair loss and though it was from a steroid but thinks it is from the coreg, she will call the insurance company on Monday and have them send the formulary.      KP

## 2012-04-28 NOTE — Telephone Encounter (Signed)
Pt left vm on triage line stating she has recently gone on Medicare and they will not pay for Coreg. She would like to know if there is another med she can take for her blood pressure due to cost and the side effects she is experiencing (memory loss, weight gain, and hair loss). Pt can be reached at [phone]

## 2012-04-28 NOTE — Telephone Encounter (Signed)
Please advise      KP 

## 2012-05-09 ENCOUNTER — Other Ambulatory Visit: Payer: Self-pay | Admitting: Family Medicine

## 2012-05-09 MED ORDER — CARVEDILOL 12.5 MG PO TABS: 12.5000 mg | ORAL_TABLET | Freq: Two times a day (BID) | ORAL | Status: DC

## 2012-05-10 NOTE — Telephone Encounter (Signed)
Discussed with patient and she voiced understanding, she will call back to scheduled the apt because she is out of town right now.      KP

## 2012-05-10 NOTE — Telephone Encounter (Signed)
Lelon Perla, DO More Detail >>      Lelon Perla, DO      Sent: Tue May 09, 2012  8:40 PM    To: Arnette Norris, CMA        Naziah Portee    MRN: [id number] DOB: 07/10/1946     Pt Work: [phone] Pt Home: [phone]           Message     Change to generic carvedilol 12.5 1 po bid #60  ----[facility] to make sure dose is right--or sooner if she has a problem

## 2012-05-11 NOTE — Telephone Encounter (Signed)
called pt to pick up [employer] booklet, she does have questions regarding her rx for Carvedilol

## 2012-05-11 NOTE — Telephone Encounter (Signed)
Pt stated she is still having hair loss, memory issues and packing on the pounds, pt states her son is a Paramedic and he says there are other meds we could put her on w/o theses side effects.  Pt would like a call back # [phone]

## 2012-05-11 NOTE — Telephone Encounter (Signed)
She said she wanted to change because of cost!---- don't let her pick up book yet.    I'll have to look tomorrow.

## 2012-05-11 NOTE — Telephone Encounter (Signed)
Patient is wanting to change her medication due to side effects. Please advise     KP

## 2012-05-12 NOTE — Telephone Encounter (Signed)
She wanted to change due to side effects.      KP

## 2012-05-16 ENCOUNTER — Ambulatory Visit (INDEPENDENT_AMBULATORY_CARE_PROVIDER_SITE_OTHER): Payer: BLUE CROSS/BLUE SHIELD | Admitting: Family Medicine

## 2012-05-16 ENCOUNTER — Encounter: Payer: Self-pay | Admitting: Family Medicine

## 2012-05-16 ENCOUNTER — Other Ambulatory Visit: Payer: Self-pay | Admitting: Family Medicine

## 2012-05-16 VITALS — BP 142/94 | HR 77 | Temp 99.0°F | Wt 169.0 lb

## 2012-05-16 DIAGNOSIS — F32A Depression, unspecified: Secondary | ICD-10-CM

## 2012-05-16 DIAGNOSIS — I10 Essential (primary) hypertension: Secondary | ICD-10-CM

## 2012-05-16 DIAGNOSIS — F329 Major depressive disorder, single episode, unspecified: Secondary | ICD-10-CM

## 2012-05-16 MED ORDER — NEBIVOLOL HCL 5 MG PO TABS: 5.0000 mg | ORAL_TABLET | Freq: Every day | ORAL | Status: DC

## 2012-05-16 MED ORDER — HYDROCHLOROTHIAZIDE 25 MG PO TABS: 25.0000 mg | ORAL_TABLET | Freq: Every day | ORAL | Status: DC

## 2012-05-16 MED ORDER — BUPROPION HCL ER (XL) 300 MG PO TB24: 300.0000 mg | ORAL_TABLET | Freq: Every day | ORAL | Status: DC

## 2012-05-16 NOTE — Progress Notes (Signed)
  Subjective:    Patient ID: Samantha Davidson, female    DOB: 01-26-1946, 66 y.o.   MRN: [id number]  HPI  Pt here to have bp med changed. She has been losing her hair by handfuls and was told by pharmacy and her computer it was the coreg.  She had norvasc in past and had problems with it as well. Pt also has been having dull headache secondary to bp high. Pt stopped coreg and has been taking left over hctz.  Review of Systems As above    Objective:   Physical Exam  Constitutional: She is oriented to person, place, and time. She appears well-developed and well-nourished.  Neck: Normal range of motion. Neck supple.  Cardiovascular: Normal rate and regular rhythm.   Pulmonary/Chest: Effort normal and breath sounds normal.  Neurological: She is alert and oriented to person, place, and time.  Psychiatric: She has a normal mood and affect. Her behavior is normal. Judgment and thought content normal.          Assessment & Plan:

## 2012-05-16 NOTE — Patient Instructions (Signed)

## 2012-05-16 NOTE — Telephone Encounter (Signed)
refill bupropion HCL XL 300 MG Tablet take 1-tablet by mouth every day --#30 wt/11-refills last fill 8.19.13 last ov 10.8.13--acute

## 2012-05-16 NOTE — Assessment & Plan Note (Signed)
con't hctz 59m daily Add bystolic 5 mg and recheck 2 weeks

## 2012-06-02 ENCOUNTER — Ambulatory Visit: Payer: Medicare Other | Admitting: Family Medicine

## 2012-06-06 DIAGNOSIS — F4323 Adjustment disorder with mixed anxiety and depressed mood: Secondary | ICD-10-CM | POA: Diagnosis not present

## 2012-06-07 ENCOUNTER — Encounter: Payer: Self-pay | Admitting: Family Medicine

## 2012-06-07 ENCOUNTER — Ambulatory Visit (INDEPENDENT_AMBULATORY_CARE_PROVIDER_SITE_OTHER): Payer: BLUE CROSS/BLUE SHIELD | Admitting: Family Medicine

## 2012-06-07 VITALS — BP 130/72 | HR 64 | Temp 98.3°F | Wt 171.2 lb

## 2012-06-07 DIAGNOSIS — I10 Essential (primary) hypertension: Secondary | ICD-10-CM | POA: Diagnosis not present

## 2012-06-07 DIAGNOSIS — Z23 Encounter for immunization: Secondary | ICD-10-CM

## 2012-06-07 MED ORDER — NEBIVOLOL HCL 5 MG PO TABS: 5.0000 mg | ORAL_TABLET | Freq: Every day | ORAL | Status: DC

## 2012-06-07 NOTE — Progress Notes (Signed)
  Subjective:    Patient here for follow-up of elevated blood pressure.  She is exercising and is adherent to a low-salt diet.  Blood pressure is well controlled at home. Cardiac symptoms: none. Patient denies: chest pain, chest pressure/discomfort, claudication, dyspnea, exertional chest pressure/discomfort, fatigue, irregular heart beat, lower extremity edema, near-syncope, orthopnea, palpitations, paroxysmal nocturnal dyspnea, syncope and tachypnea. Cardiovascular risk factors: hypertension. Use of agents associated with hypertension: none. History of target organ damage: none.----Pts hair is growing back  The following portions of the patient's history were reviewed and updated as appropriate: allergies, current medications, past family history, past medical history, past social history, past surgical history and problem list.  Review of Systems Pertinent items are noted in HPI.     Objective:    BP 130/72  Pulse 64  Temp 98.3 F (36.8 C) (Oral)  Wt 171 lb 3.2 oz (77.656 kg)  SpO2 96% General appearance: alert, cooperative, appears stated age and no distress Lungs: clear to auscultation bilaterally Heart: S1, S2 normal Extremities: extremities normal, atraumatic, no cyanosis or edema    Assessment:    Hypertension, normal blood pressure . Evidence of target organ damage: none.    Plan:    Medication: no change. Dietary sodium restriction. Regular aerobic exercise. Check blood pressures 2-3 times weekly and record. Follow up: 3 months and as needed.

## 2012-06-09 ENCOUNTER — Telehealth: Payer: Self-pay | Admitting: Family Medicine

## 2012-06-09 DIAGNOSIS — I10 Essential (primary) hypertension: Secondary | ICD-10-CM

## 2012-06-09 MED ORDER — NEBIVOLOL HCL 5 MG PO TABS: 5.0000 mg | ORAL_TABLET | Freq: Every day | ORAL | Status: DC

## 2012-06-09 NOTE — Telephone Encounter (Signed)
Pt states [facility] does not have [facility] bp med. She would like to pick this up today. Call mobile # with any questions.

## 2012-06-09 NOTE — Telephone Encounter (Signed)
Rx re-sent   KP 

## 2012-07-11 ENCOUNTER — Encounter: Payer: Self-pay | Admitting: Internal Medicine

## 2012-10-03 DIAGNOSIS — F4323 Adjustment disorder with mixed anxiety and depressed mood: Secondary | ICD-10-CM | POA: Diagnosis not present

## 2012-11-25 ENCOUNTER — Other Ambulatory Visit: Payer: Self-pay | Admitting: Family Medicine

## 2012-11-29 ENCOUNTER — Other Ambulatory Visit: Payer: Self-pay | Admitting: Family Medicine

## 2012-12-27 ENCOUNTER — Encounter: Payer: Self-pay | Admitting: Family Medicine

## 2012-12-27 ENCOUNTER — Ambulatory Visit (INDEPENDENT_AMBULATORY_CARE_PROVIDER_SITE_OTHER): Payer: Medicare Other | Admitting: Family Medicine

## 2012-12-27 VITALS — BP 160/96 | HR 61 | Temp 99.0°F | Wt 164.0 lb

## 2012-12-27 DIAGNOSIS — I10 Essential (primary) hypertension: Secondary | ICD-10-CM | POA: Diagnosis not present

## 2012-12-27 DIAGNOSIS — F411 Generalized anxiety disorder: Secondary | ICD-10-CM | POA: Diagnosis not present

## 2012-12-27 DIAGNOSIS — F341 Dysthymic disorder: Secondary | ICD-10-CM

## 2012-12-27 DIAGNOSIS — H2510 Age-related nuclear cataract, unspecified eye: Secondary | ICD-10-CM | POA: Diagnosis not present

## 2012-12-27 DIAGNOSIS — F418 Other specified anxiety disorders: Secondary | ICD-10-CM

## 2012-12-27 LAB — HEPATIC FUNCTION PANEL
ALT: 17 U/L (ref 0–35)
AST: 18 U/L (ref 0–37)
Albumin: 3.9 g/dL (ref 3.5–5.2)
Alkaline Phosphatase: 60 U/L (ref 39–117)
Bilirubin, Direct: 0.1 mg/dL (ref 0.0–0.3)
Total Bilirubin: 0.7 mg/dL (ref 0.3–1.2)
Total Protein: 6.8 g/dL (ref 6.0–8.3)

## 2012-12-27 LAB — LIPID PANEL
Cholesterol: 251 mg/dL — ABNORMAL HIGH (ref 0–200)
HDL: 89.6 mg/dL (ref >39.00)
Total CHOL/HDL Ratio: 3
Triglycerides: 87 mg/dL (ref 0.0–149.0)
VLDL: 17.4 mg/dL (ref 0.0–40.0)

## 2012-12-27 LAB — BASIC METABOLIC PANEL
BUN: 15 mg/dL (ref 6–23)
CO2: 29 mEq/L (ref 19–32)
Calcium: 9.3 mg/dL (ref 8.4–10.5)
Chloride: 106 mEq/L (ref 96–112)
Creatinine, Ser: 0.8 mg/dL (ref 0.4–1.2)
GFR: 80.99 mL/min (ref >60.00)
Glucose, Bld: 98 mg/dL (ref 70–99)
Potassium: 4.2 mEq/L (ref 3.5–5.1)
Sodium: 141 mEq/L (ref 135–145)

## 2012-12-27 LAB — LDL CHOLESTEROL, DIRECT: Direct LDL: 138.9 mg/dL

## 2012-12-27 MED ORDER — LOSARTAN POTASSIUM 50 MG PO TABS: 50.0000 mg | ORAL_TABLET | Freq: Every day | ORAL | Status: DC

## 2012-12-27 MED ORDER — CITALOPRAM HYDROBROMIDE 10 MG PO TABS: 10.0000 mg | ORAL_TABLET | Freq: Every day | ORAL | Status: DC

## 2012-12-27 NOTE — Patient Instructions (Addendum)

## 2012-12-27 NOTE — Progress Notes (Signed)
  Subjective:    Patient here for follow-up of elevated blood pressure.  She is not exercising and is adherent to a low-salt diet.  Blood pressure is not well controlled at home. Cardiac symptoms: none. Patient denies: chest pain, chest pressure/discomfort, claudication, dyspnea, exertional chest pressure/discomfort, irregular heart beat, lower extremity edema, near-syncope, orthopnea, palpitations, paroxysmal nocturnal dyspnea, syncope and tachypnea. Cardiovascular risk factors: advanced age (older than 14 for men, 31 for women), hypertension and sedentary lifestyle. Use of agents associated with hypertension: none. History of target organ damage: none.  Pt does not like the bp med.  She is complaining of hair and memory loss.  So she has not taken it for 4 days.     Pt also c/o increased stress/ anxiety.  Her husband lost his job because of his parkinsons and her mother is dying from lung cancer.   The following portions of the patient's history were reviewed and updated as appropriate: allergies, current medications, past family history, past medical history, past social history, past surgical history and problem list.  Review of Systems As above    Objective:    BP 160/96  Pulse 61  Temp(Src) 99 F (37.2 C) (Oral)  Wt 164 lb (74.39 kg)  BMI 29.06 kg/m2  SpO2 97% General appearance: alert, cooperative, appears stated age and no distress Neck: no adenopathy, supple, symmetrical, trachea midline and thyroid not enlarged, symmetric, no tenderness/mass/nodules Lungs: clear to auscultation bilaterally Heart: regular rate and rhythm, S1, S2 normal, no murmur, click, rub or gallop Extremities: extremities normal, atraumatic, no cyanosis or edema Psych-- teary, otherwise normal affect      Assessment:    Hypertension, stage 1 . Evidence of target organ damage: none.    Plan:    Medication: discontinue bystolic and begin losartan. Dietary sodium restriction. Regular aerobic  exercise. Check blood pressures 2-3 times weekly and record. Follow up: 1 month and as needed.

## 2012-12-27 NOTE — Assessment & Plan Note (Signed)
con't wellbutrin Start celexa 10 mg

## 2013-01-23 DIAGNOSIS — F4323 Adjustment disorder with mixed anxiety and depressed mood: Secondary | ICD-10-CM | POA: Diagnosis not present

## 2013-01-25 ENCOUNTER — Other Ambulatory Visit: Payer: Self-pay | Admitting: Family Medicine

## 2013-01-29 ENCOUNTER — Ambulatory Visit: Payer: Medicare Other | Admitting: Family Medicine

## 2013-01-29 ENCOUNTER — Ambulatory Visit (INDEPENDENT_AMBULATORY_CARE_PROVIDER_SITE_OTHER): Payer: Medicare Other | Admitting: Family Medicine

## 2013-01-29 ENCOUNTER — Encounter: Payer: Self-pay | Admitting: Family Medicine

## 2013-01-29 VITALS — BP 130/72 | HR 73 | Temp 99.0°F | Wt 165.2 lb

## 2013-01-29 DIAGNOSIS — I10 Essential (primary) hypertension: Secondary | ICD-10-CM | POA: Diagnosis not present

## 2013-01-29 DIAGNOSIS — F341 Dysthymic disorder: Secondary | ICD-10-CM | POA: Diagnosis not present

## 2013-01-29 DIAGNOSIS — F418 Other specified anxiety disorders: Secondary | ICD-10-CM

## 2013-01-29 NOTE — Patient Instructions (Signed)

## 2013-01-29 NOTE — Assessment & Plan Note (Signed)
con't meds Doing well

## 2013-01-29 NOTE — Progress Notes (Signed)
  Subjective:    Patient here for follow-up of elevated blood pressure.  She is exercising and is adherent to a low-salt diet.  Blood pressure is well controlled at home. Cardiac symptoms: none. Patient denies: chest pain, chest pressure/discomfort, claudication, dyspnea, exertional chest pressure/discomfort, fatigue, irregular heart beat, lower extremity edema, near-syncope, orthopnea, palpitations, paroxysmal nocturnal dyspnea, syncope and tachypnea. Cardiovascular risk factors: advanced age (older than 52 for men, 38 for women) and hypertension. Use of agents associated with hypertension: none. History of target organ damage: none.  The following portions of the patient's history were reviewed and updated as appropriate: allergies, current medications, past family history, past medical history, past social history, past surgical history and problem list.  Review of Systems Pertinent items are noted in HPI.     Objective:    BP 130/72  Pulse 73  Temp(Src) 99 F (37.2 C) (Oral)  Wt 165 lb 3.2 oz (74.934 kg)  BMI 29.27 kg/m2  SpO2 97% General appearance: alert, cooperative, appears stated age and no distress Lungs: clear to auscultation bilaterally Heart: S1, S2 normal Extremities: extremities normal, atraumatic, no cyanosis or edema    Assessment:    Hypertension, normal blood pressure . Evidence of target organ damage: none.    Plan:    Medication: no change. Dietary sodium restriction. Regular aerobic exercise. Check blood pressures 2-3 times weekly and record. Follow up: 2 weeks and as needed.

## 2013-01-29 NOTE — Assessment & Plan Note (Signed)
Stable con't meds 

## 2013-02-26 DIAGNOSIS — F4323 Adjustment disorder with mixed anxiety and depressed mood: Secondary | ICD-10-CM | POA: Diagnosis not present

## 2013-04-06 ENCOUNTER — Encounter: Payer: Self-pay | Admitting: Internal Medicine

## 2013-04-06 ENCOUNTER — Other Ambulatory Visit: Payer: Self-pay | Admitting: *Deleted

## 2013-04-06 DIAGNOSIS — I10 Essential (primary) hypertension: Secondary | ICD-10-CM

## 2013-04-06 DIAGNOSIS — F411 Generalized anxiety disorder: Secondary | ICD-10-CM

## 2013-04-06 MED ORDER — CITALOPRAM HYDROBROMIDE 10 MG PO TABS: 10.0000 mg | ORAL_TABLET | Freq: Every day | ORAL | Status: DC

## 2013-04-06 MED ORDER — LOSARTAN POTASSIUM 50 MG PO TABS: 50.0000 mg | ORAL_TABLET | Freq: Every day | ORAL | Status: DC

## 2013-04-06 NOTE — Telephone Encounter (Signed)
Rx was refilled for losartan pot. Ag cma

## 2013-04-06 NOTE — Telephone Encounter (Signed)
Rx refilled for citalopram.  Ag cma

## 2013-06-09 ENCOUNTER — Other Ambulatory Visit: Payer: Self-pay | Admitting: Family Medicine

## 2013-07-09 ENCOUNTER — Other Ambulatory Visit: Payer: Self-pay | Admitting: Family Medicine

## 2013-08-05 ENCOUNTER — Other Ambulatory Visit: Payer: Self-pay | Admitting: Family Medicine

## 2013-08-24 ENCOUNTER — Other Ambulatory Visit: Payer: Self-pay | Admitting: Family Medicine

## 2013-09-18 ENCOUNTER — Other Ambulatory Visit: Payer: Self-pay | Admitting: Family Medicine

## 2013-09-20 ENCOUNTER — Other Ambulatory Visit: Payer: Self-pay | Admitting: Family Medicine

## 2013-09-21 ENCOUNTER — Encounter: Payer: Self-pay | Admitting: Family Medicine

## 2013-09-21 ENCOUNTER — Ambulatory Visit (INDEPENDENT_AMBULATORY_CARE_PROVIDER_SITE_OTHER): Payer: Medicare Other | Admitting: Family Medicine

## 2013-09-21 ENCOUNTER — Encounter: Payer: Self-pay | Admitting: *Deleted

## 2013-09-21 VITALS — BP 136/86 | HR 69 | Temp 98.9°F | Wt 169.0 lb

## 2013-09-21 DIAGNOSIS — F329 Major depressive disorder, single episode, unspecified: Secondary | ICD-10-CM

## 2013-09-21 DIAGNOSIS — G4700 Insomnia, unspecified: Secondary | ICD-10-CM

## 2013-09-21 DIAGNOSIS — Z23 Encounter for immunization: Secondary | ICD-10-CM | POA: Diagnosis not present

## 2013-09-21 DIAGNOSIS — F32A Depression, unspecified: Secondary | ICD-10-CM

## 2013-09-21 DIAGNOSIS — I10 Essential (primary) hypertension: Secondary | ICD-10-CM

## 2013-09-21 DIAGNOSIS — F3289 Other specified depressive episodes: Secondary | ICD-10-CM | POA: Diagnosis not present

## 2013-09-21 LAB — CBC WITH DIFFERENTIAL/PLATELET
Basophils Absolute: 0 10*3/uL (ref 0.0–0.1)
Basophils Relative: 0.4 % (ref 0.0–3.0)
Eosinophils Absolute: 0.1 10*3/uL (ref 0.0–0.7)
Eosinophils Relative: 1.3 % (ref 0.0–5.0)
HCT: 40.1 % (ref 36.0–46.0)
Hemoglobin: 13.2 g/dL (ref 12.0–15.0)
Lymphocytes Relative: 25.9 % (ref 12.0–46.0)
Lymphs Abs: 1.7 10*3/uL (ref 0.7–4.0)
MCHC: 33.1 g/dL (ref 30.0–36.0)
MCV: 98.9 fl (ref 78.0–100.0)
Monocytes Absolute: 0.5 10*3/uL (ref 0.1–1.0)
Monocytes Relative: 7.5 % (ref 3.0–12.0)
Neutro Abs: 4.3 10*3/uL (ref 1.4–7.7)
Neutrophils Relative %: 64.9 % (ref 43.0–77.0)
Platelets: 275 10*3/uL (ref 150.0–400.0)
RBC: 4.05 Mil/uL (ref 3.87–5.11)
RDW: 12.2 % (ref 11.5–14.6)
WBC: 6.6 10*3/uL (ref 4.5–10.5)

## 2013-09-21 LAB — POCT URINALYSIS DIPSTICK
Bilirubin, UA: NEGATIVE
Blood, UA: NEGATIVE
Glucose, UA: NEGATIVE
Ketones, UA: NEGATIVE
Leukocytes, UA: NEGATIVE
Nitrite, UA: NEGATIVE
Protein, UA: NEGATIVE
Spec Grav, UA: 1.02
Urobilinogen, UA: 0.2
pH, UA: 6

## 2013-09-21 LAB — HEPATIC FUNCTION PANEL
ALT: 17 U/L (ref 0–35)
AST: 19 U/L (ref 0–37)
Albumin: 4.3 g/dL (ref 3.5–5.2)
Alkaline Phosphatase: 67 U/L (ref 39–117)
Bilirubin, Direct: 0.1 mg/dL (ref 0.0–0.3)
Total Bilirubin: 0.8 mg/dL (ref 0.3–1.2)
Total Protein: 6.9 g/dL (ref 6.0–8.3)

## 2013-09-21 LAB — TSH: TSH: 1.38 u[IU]/mL (ref 0.35–5.50)

## 2013-09-21 LAB — LDL CHOLESTEROL, DIRECT: Direct LDL: 148 mg/dL

## 2013-09-21 LAB — LIPID PANEL
Cholesterol: 261 mg/dL — ABNORMAL HIGH (ref 0–200)
HDL: 104.2 mg/dL (ref >39.00)
Total CHOL/HDL Ratio: 3
Triglycerides: 60 mg/dL (ref 0.0–149.0)
VLDL: 12 mg/dL (ref 0.0–40.0)

## 2013-09-21 LAB — BASIC METABOLIC PANEL
BUN: 19 mg/dL (ref 6–23)
CO2: 31 mEq/L (ref 19–32)
Calcium: 9.5 mg/dL (ref 8.4–10.5)
Chloride: 103 mEq/L (ref 96–112)
Creatinine, Ser: 0.8 mg/dL (ref 0.4–1.2)
GFR: 78.42 mL/min (ref >60.00)
Glucose, Bld: 103 mg/dL — ABNORMAL HIGH (ref 70–99)
Potassium: 4.1 mEq/L (ref 3.5–5.1)
Sodium: 139 mEq/L (ref 135–145)

## 2013-09-21 MED ORDER — BUPROPION HCL ER (XL) 300 MG PO TB24: ORAL_TABLET | ORAL | Status: DC

## 2013-09-21 MED ORDER — CITALOPRAM HYDROBROMIDE 10 MG PO TABS: ORAL_TABLET | ORAL | Status: DC

## 2013-09-21 MED ORDER — LOSARTAN POTASSIUM 50 MG PO TABS: ORAL_TABLET | ORAL | Status: DC

## 2013-09-21 MED ORDER — HYDROCHLOROTHIAZIDE 25 MG PO TABS: ORAL_TABLET | ORAL | Status: DC

## 2013-09-21 NOTE — Progress Notes (Signed)
  Subjective:    Patient here for follow-up of elevated blood pressure.  She is exercising and is adherent to a low-salt diet.  Blood pressure is well controlled at home. Cardiac symptoms: none. Patient denies: chest pain, chest pressure/discomfort, claudication, dyspnea, exertional chest pressure/discomfort, fatigue, irregular heart beat, lower extremity edema, near-syncope, orthopnea, palpitations, paroxysmal nocturnal dyspnea, syncope and tachypnea. Cardiovascular risk factors: advanced age (older than 73 for men, 48 for women) and hypertension. Use of agents associated with hypertension: none. History of target organ damage: none.  The following portions of the patient's history were reviewed and updated as appropriate: allergies, current medications, past family history, past medical history, past social history, past surgical history and problem list.  Review of Systems Pertinent items are noted in HPI.     Objective:    BP 136/86  Pulse 69  Temp(Src) 98.9 F (37.2 C) (Oral)  Wt 169 lb (76.658 kg)  SpO2 98%  General Appearance:    Alert, cooperative, no distress, appears stated age  Head:    Normocephalic, without obvious abnormality, atraumatic  Eyes:    PERRL, conjunctiva/corneas clear, EOM's intact, fundi    benign, both eyes       Ears:    Normal TM's and external ear canals, both ears  Nose:   Nares normal, septum midline, mucosa normal, no drainage    or sinus tenderness  Throat:   Lips, mucosa, and tongue normal; teeth and gums normal  Neck:   Supple, symmetrical, trachea midline, no adenopathy;       thyroid:  No enlargement/tenderness/nodules; no carotid   bruit or JVD  Back:     Symmetric, no curvature, ROM normal, no CVA tenderness  Lungs:     Clear to auscultation bilaterally, respirations unlabored  Chest wall:    No tenderness or deformity  Heart:    Regular rate and rhythm, S1 and S2 normal, no murmur, rub   or gallop  Abdomen:     Soft, non-tender, bowel sounds  active all four quadrants,    no masses, no organomegaly  Genitalia:    Normal female without lesion, discharge or tenderness  Rectal:    Normal tone, normal prostate, no masses or tenderness;   guaiac negative stool  Extremities:   Extremities normal, atraumatic, no cyanosis or edema  Pulses:   2+ and symmetric all extremities  Skin:   Skin color, texture, turgor normal, no rashes or lesions  Lymph nodes:   Cervical, supraclavicular, and axillary nodes normal  Neurologic:   CNII-XII intact. Normal strength, sensation and reflexes      throughout      Assessment:    Hypertension, normal blood pressure . Evidence of target organ damage: none.    Plan:    Medication: no change. Dietary sodium restriction. Regular aerobic exercise. Check blood pressures 2-3 times weekly and record. Follow up: 6 months and as needed.   1. HTN (hypertension) stable - hydrochlorothiazide (HYDRODIURIL) 25 MG tablet; 1 tab by mouth daily--Office visit due now  Dispense: 90 tablet; Refill: 3 - losartan (COZAAR) 50 MG tablet; TAKE 1 TABLET (50 MG TOTAL) BY MOUTH DAILY.  Dispense: 30 tablet; Refill: 1  2. Depression con't meds - buPROPion (WELLBUTRIN XL) 300 MG 24 hr tablet; TAKE 1 TABLET BY MOUTH EVERY MORNING  Dispense: 90 tablet; Refill: 3 - citalopram (CELEXA) 10 MG tablet; TAKE 1 TABLET (10 MG TOTAL) BY MOUTH DAILY.  Dispense: 90 tablet; Refill: 3

## 2013-09-21 NOTE — Patient Instructions (Signed)

## 2013-09-21 NOTE — Progress Notes (Signed)
Pre visit review using our clinic review tool, if applicable. No additional management support is needed unless otherwise documented below in the visit note. 

## 2013-09-24 ENCOUNTER — Telehealth: Payer: Self-pay | Admitting: Family Medicine

## 2013-09-24 NOTE — Telephone Encounter (Signed)
Relevant patient education assigned to patient using Emmi. ° °

## 2013-10-21 ENCOUNTER — Other Ambulatory Visit: Payer: Self-pay | Admitting: Family Medicine

## 2013-10-29 ENCOUNTER — Other Ambulatory Visit: Payer: Self-pay | Admitting: Family Medicine

## 2014-02-25 ENCOUNTER — Ambulatory Visit (INDEPENDENT_AMBULATORY_CARE_PROVIDER_SITE_OTHER): Payer: Medicare Other | Admitting: Physician Assistant

## 2014-02-25 ENCOUNTER — Encounter: Payer: Self-pay | Admitting: Physician Assistant

## 2014-02-25 VITALS — BP 159/75 | HR 81 | Temp 98.3°F | Resp 16 | Ht 63.0 in | Wt 176.4 lb

## 2014-02-25 DIAGNOSIS — M5431 Sciatica, right side: Secondary | ICD-10-CM

## 2014-02-25 DIAGNOSIS — M5430 Sciatica, unspecified side: Secondary | ICD-10-CM | POA: Diagnosis not present

## 2014-02-25 DIAGNOSIS — M2560 Stiffness of unspecified joint, not elsewhere classified: Secondary | ICD-10-CM | POA: Diagnosis not present

## 2014-02-25 MED ORDER — METHYLPREDNISOLONE (PAK) 4 MG PO TABS: ORAL_TABLET | ORAL | Status: DC

## 2014-02-25 NOTE — Assessment & Plan Note (Signed)
Will obtain rheumatoid panel.

## 2014-02-25 NOTE — Assessment & Plan Note (Signed)
Rx Medrol dose pack.  Tylenol for pain.  Avoid heavy lifting or overexertion.  Aspercreme.  Avoid heavy lifting or overexertion.

## 2014-02-25 NOTE — Patient Instructions (Signed)
Please take medrol dose pack as directed.  Tylenol for pain.  Avoid heavy lifting or overexertion.  Apply topical Aspercreme to affected area.  Apply heating pad for 15 minutes at a time. Call or return to clinic if symptoms are not improving.    Please obtain labs.  I will call you with your results.  Speak with Dr. Etter Sjogren about referral to a hand specialist.

## 2014-02-25 NOTE — Progress Notes (Signed)
Patient presents to clinic today c/o right-sided low back pain radiating down hip and RLE that has been present x 4 days. Denies numbness or weakness of lower extremity.  Patient also complains of chronic stiffness and pain of hands bilaterally.  Endorses significant family history of rheumatoid arthritis.  Past Medical History  Diagnosis Date  . Allergy   . Asthma   . Depression   . GERD (gastroesophageal reflux disease)   . History of DVT of lower extremity   . Hypertension   . Lymphocytic colitis   . Rectal polyp   . Tubular adenoma of colon     Current Outpatient Prescriptions on File Prior to Visit  Medication Sig Dispense Refill  . acetaminophen (TYLENOL) 500 MG tablet Take 500 mg by mouth every 6 (six) hours as needed.      Marland Kitchen aspirin 81 MG EC tablet Take 81 mg by mouth daily.        Marland Kitchen buPROPion (WELLBUTRIN XL) 300 MG 24 hr tablet TAKE 1 TABLET (300 MG TOTAL) BY MOUTH EVERY MORNING.  30 tablet  5  . Cholecalciferol (VITAMIN D) 2000 UNITS CAPS Take 2 capsules by mouth daily.        . citalopram (CELEXA) 10 MG tablet TAKE 1 TABLET (10 MG TOTAL) BY MOUTH DAILY.  30 tablet  5  . EPINEPHrine (EPIPEN) 0.3 mg/0.3 mL DEVI Inject 0.3 mLs (0.3 mg total) into the muscle once.  1 Device  1  . fexofenadine (ALLEGRA) 180 MG tablet Take 1 tablet (180 mg total) by mouth daily.  30 tablet  11  . Fluticasone-Salmeterol (ADVAIR DISKUS) 250-50 MCG/DOSE AEPB Inhale 1 puff into the lungs 2 (two) times daily.  60 each  5  . hydrochlorothiazide (HYDRODIURIL) 25 MG tablet TAKE 1 TABLET EVERY DAY  30 tablet  5  . losartan (COZAAR) 50 MG tablet TAKE 1 TABLET (50 MG TOTAL) BY MOUTH DAILY.  30 tablet  5  . Multiple Vitamin (MULTIVITAMIN PO) Take 1 tablet by mouth daily.        . traZODone (DESYREL) 50 MG tablet Take 1/4 tab  30 tablet  2   No current facility-administered medications on file prior to visit.    Allergies  Allergen Reactions  . Codeine Nausea And Vomiting    Family History  Problem  Relation Age of Onset  . Arthritis Maternal Grandmother   . Lung cancer Mother   . Heart disease Mother   . Mental illness Mother   . Cancer Father     History   Social History  . Marital Status: Married    Spouse Name: N/A    Number of Children: 2  . Years of Education: N/A   Occupational History  . interior designer   .     Social History Main Topics  . Smoking status: Former Research scientist (life sciences)  . Smokeless tobacco: Never Used     Comment: only when stressed  . Alcohol Use: Yes  . Drug Use: No  . Sexual Activity: None   Other Topics Concern  . None   Social History Narrative  . None   Review of Systems - See HPI.  All other ROS are negative.  BP 159/75  Pulse 81  Temp(Src) 98.3 F (36.8 C) (Oral)  Resp 16  Ht 5\' 3"  (1.6 m)  Wt 176 lb 6 oz (80.003 kg)  BMI 31.25 kg/m2  SpO2 97%  Physical Exam  Vitals reviewed. Constitutional: She is oriented to person, place, and time and  well-developed, well-nourished, and in no distress.  HENT:  Head: Normocephalic and atraumatic.  Cardiovascular: Normal rate, regular rhythm, normal heart sounds and intact distal pulses.   Pulmonary/Chest: Effort normal and breath sounds normal. No respiratory distress. She has no wheezes. She has no rales. She exhibits no tenderness.  Musculoskeletal:       Lumbar back: She exhibits tenderness and pain. She exhibits normal range of motion, no bony tenderness and no spasm.  Neurological: She is alert and oriented to person, place, and time.  Skin: Skin is warm and dry. No rash noted.  Psychiatric: Affect normal.   Assessment/Plan: Sciatica Rx Medrol dose pack.  Tylenol for pain.  Avoid heavy lifting or overexertion.  Aspercreme.  Avoid heavy lifting or overexertion.  Joint stiffness Will obtain rheumatoid panel.

## 2014-02-25 NOTE — Progress Notes (Signed)
Pre visit review using our clinic review tool, if applicable. No additional management support is needed unless otherwise documented below in the visit note/SLS  

## 2014-02-26 ENCOUNTER — Telehealth: Payer: Self-pay | Admitting: Family Medicine

## 2014-02-26 LAB — RHEUMATOID FACTOR: Rhuematoid fact SerPl-aCnc: <10 IU/mL (ref <=14)

## 2014-02-26 LAB — CYCLIC CITRUL PEPTIDE ANTIBODY, IGG: Cyclic Citrullin Peptide Ab: <2 U/mL (ref 0.0–5.0)

## 2014-02-26 MED ORDER — TRAMADOL HCL 50 MG PO TABS: 50.0000 mg | ORAL_TABLET | Freq: Two times a day (BID) | ORAL | Status: DC | PRN

## 2014-02-26 NOTE — Telephone Encounter (Signed)
Patient allergic to codeine and codeine-containing products.  I am willing to try a trial of Tramadol.  Rx to be faxed to patient's pharmacy.

## 2014-02-26 NOTE — Telephone Encounter (Signed)
Caller name: Desani Relation to pt: Call back number: [phone] Pharmacy: [facility] [address]  Reason for call:  Pt was seen in office on 7/20 by [facility] for sciatica.  Pt was advised to take tylenol.  Pt is still in pain and is wanting to know if she can get something stronger.  Please advise.

## 2014-02-27 MED ORDER — TRAMADOL HCL 50 MG PO TABS: 50.0000 mg | ORAL_TABLET | Freq: Two times a day (BID) | ORAL | Status: DC | PRN

## 2014-02-27 NOTE — Telephone Encounter (Signed)
Rx request faxed to pharmacy/SLS  

## 2014-02-27 NOTE — Telephone Encounter (Signed)
Please Advise on status of Rx/SLS

## 2014-02-27 NOTE — Telephone Encounter (Signed)
Rx printed, signed and given to [place].

## 2014-02-28 ENCOUNTER — Telehealth: Payer: Self-pay | Admitting: Family Medicine

## 2014-02-28 MED ORDER — TRAMADOL HCL 50 MG PO TABS: 50.0000 mg | ORAL_TABLET | Freq: Four times a day (QID) | ORAL | Status: DC

## 2014-02-28 MED ORDER — PREDNISONE 20 MG PO TABS: ORAL_TABLET | ORAL | Status: DC

## 2014-02-28 NOTE — Telephone Encounter (Signed)
Pt states that her pain has improved some with the medrol pack, but probably would not be as effective without the Tramadol (she taking it every 4 hours).  She was told that within 24 hours she would be feeling better, but she says this has not been the case.  She has 1 dose left for today and 2 doses left for tomorrow.    Please advise.

## 2014-02-28 NOTE — Telephone Encounter (Signed)
Caller name: C[place]  Call back number: [phone] Pharmacy: [facility] [address]  Reason for call:  Pt was given the Rx methylPREDNIsolone (MEDROL DOSPACK) 4 MG by [place] on 7/20.  Pt is needing more of this. She is out and is going out of town in the AM>  Please call pt to advise.

## 2014-02-28 NOTE — Telephone Encounter (Signed)
Spoke with patient.  Pain has improved with medication but is still present.  She is going out of town and does not want to have worsening of pain while on vacation.  having to use tramadol more frequently than anticipated.  Will reorder Tramadol with increased dosing and quantity.  Sent to pharmacy. Patient to repeat steroid taper after completion.  If no improvement, she needs to return to the office for evaluation and imaging.

## 2014-02-28 NOTE — Telephone Encounter (Signed)
Patient called and informed that she would need to come back in to office for A&E before any other medications could be prescribed; as she is still taking the Medrol Dosepack given on 07.20.15 & was px Tramadol 50 mg for pain on 07.22.15; pt began to use a Nasty, Loud tone and stated that "her son was with her and was a Hospitalist and why couldn't we give her what she asked for if he said it was all right to do". Explained to patient that her son is not a physician in our clinic, nor did he work for the [employer] system, and that would be against policy. Pt again stated that she could not come into office and I once again apologized to her, but reiterated that this would be necessary before any new medications could be given. Pt once again began speaking in a loud voice stating that if "her doctor was not out sick that she never would have had to see another provider in the first place, and if she was available she would give her what she ask for", once again apologized that her PCP was unavailable and that her provider would be quite familiar with her history as a different provider would not and that becomes the issue sometimes when we can't see our own provider. Patient stated that PA that she seen "did nothing for her but poke her in the back and then she had to practically crawl out of the office and she would never see that PA again"; I again apologized that she felt that way and told stated to patient that this is always a patient's right as to who they choose to see. Leeanne [place], [place] madr aware and called patient back/SLS

## 2014-05-29 ENCOUNTER — Ambulatory Visit (INDEPENDENT_AMBULATORY_CARE_PROVIDER_SITE_OTHER): Payer: Medicare Other | Admitting: Family Medicine

## 2014-05-29 ENCOUNTER — Encounter: Payer: Self-pay | Admitting: Family Medicine

## 2014-05-29 VITALS — BP 122/80 | HR 80 | Temp 98.2°F | Resp 16 | Wt 177.0 lb

## 2014-05-29 DIAGNOSIS — M79644 Pain in right finger(s): Secondary | ICD-10-CM | POA: Insufficient documentation

## 2014-05-29 MED ORDER — CELECOXIB 200 MG PO CAPS: 200.0000 mg | ORAL_CAPSULE | Freq: Two times a day (BID) | ORAL | Status: DC

## 2014-05-29 NOTE — Progress Notes (Signed)
Pre visit review using our clinic review tool, if applicable. No additional management support is needed unless otherwise documented below in the visit note. 

## 2014-05-29 NOTE — Patient Instructions (Signed)
Follow up as needed Start the Celebrex daily for the arthritis inflammation We'll call you with your Hand Specialist appt We'll notify you of your lab results and make any changes if needed Call with any questions or concerns Hang in there!!!

## 2014-05-29 NOTE — Progress Notes (Signed)
   Subjective:    Patient ID: Sofija Antwi, female    DOB: [birth year], 68 y.o.   MRN: [id number]  HPI Hand pain- pt has known arthritis in hands but ~1 week ago R 4th finger began swelling and it is very painful and difficult to use hand.  Not currently on any NSAIDs.  Recently tested (-) for RF and CCP.  Pt has noticed some warmth and intermittent redness of finger.   Review of Systems For ROS see HPI     Objective:   Physical Exam  Vitals reviewed. Constitutional: She is oriented to person, place, and time. She appears well-developed and well-nourished. No distress.  Cardiovascular: Intact distal pulses.   Musculoskeletal: She exhibits edema (R 4th finger, particularly at PIP joint) and tenderness (of R 4th finger).  Inability to bend 4th finger at PIP joint or make fist  Neurological: She is alert and oriented to person, place, and time.  Skin: Skin is warm and dry. No erythema.          Assessment & Plan:

## 2014-05-30 LAB — URIC ACID: Uric Acid, Serum: 5.8 mg/dL (ref 2.4–7.0)

## 2014-06-02 NOTE — Assessment & Plan Note (Signed)
New to provider.  Pt had recent negative RF workup.  Suspect severe OA.  Start Celebrex daily.  Refer to hand specialist.  Due to pt's report of erythema and warmth, must r/o gout.  Reviewed supportive care and red flags that should prompt return.  Pt expressed understanding and is in agreement w/ plan.

## 2014-06-17 DIAGNOSIS — M19041 Primary osteoarthritis, right hand: Secondary | ICD-10-CM | POA: Diagnosis not present

## 2014-06-17 DIAGNOSIS — M19042 Primary osteoarthritis, left hand: Secondary | ICD-10-CM | POA: Diagnosis not present

## 2014-06-19 ENCOUNTER — Other Ambulatory Visit: Payer: Self-pay | Admitting: Family Medicine

## 2014-06-20 ENCOUNTER — Other Ambulatory Visit: Payer: Self-pay

## 2014-06-20 MED ORDER — BUPROPION HCL ER (XL) 300 MG PO TB24: ORAL_TABLET | ORAL | Status: DC

## 2014-07-09 ENCOUNTER — Other Ambulatory Visit: Payer: Self-pay | Admitting: Family Medicine

## 2014-07-13 ENCOUNTER — Other Ambulatory Visit: Payer: Self-pay | Admitting: Family Medicine

## 2014-07-22 ENCOUNTER — Emergency Department (HOSPITAL_COMMUNITY)
Admission: EM | Admit: 2014-07-22 | Discharge: 2014-07-22 | Disposition: A | Discharge status: Home / Self Care | Payer: Medicare Other | Attending: Emergency Medicine | Admitting: Emergency Medicine

## 2014-07-22 ENCOUNTER — Emergency Department (HOSPITAL_COMMUNITY): Payer: Medicare Other

## 2014-07-22 ENCOUNTER — Encounter (HOSPITAL_COMMUNITY): Payer: Self-pay

## 2014-07-22 DIAGNOSIS — Z86010 Personal history of colonic polyps: Secondary | ICD-10-CM | POA: Insufficient documentation

## 2014-07-22 DIAGNOSIS — Z791 Long term (current) use of non-steroidal anti-inflammatories (NSAID): Secondary | ICD-10-CM | POA: Diagnosis not present

## 2014-07-22 DIAGNOSIS — Z86718 Personal history of other venous thrombosis and embolism: Secondary | ICD-10-CM | POA: Insufficient documentation

## 2014-07-22 DIAGNOSIS — J40 Bronchitis, not specified as acute or chronic: Secondary | ICD-10-CM

## 2014-07-22 DIAGNOSIS — Z7982 Long term (current) use of aspirin: Secondary | ICD-10-CM | POA: Insufficient documentation

## 2014-07-22 DIAGNOSIS — Z79899 Other long term (current) drug therapy: Secondary | ICD-10-CM | POA: Diagnosis not present

## 2014-07-22 DIAGNOSIS — Z87891 Personal history of nicotine dependence: Secondary | ICD-10-CM | POA: Insufficient documentation

## 2014-07-22 DIAGNOSIS — J45901 Unspecified asthma with (acute) exacerbation: Secondary | ICD-10-CM | POA: Diagnosis not present

## 2014-07-22 DIAGNOSIS — R05 Cough: Secondary | ICD-10-CM | POA: Diagnosis not present

## 2014-07-22 DIAGNOSIS — F329 Major depressive disorder, single episode, unspecified: Secondary | ICD-10-CM | POA: Diagnosis not present

## 2014-07-22 DIAGNOSIS — I10 Essential (primary) hypertension: Secondary | ICD-10-CM | POA: Diagnosis not present

## 2014-07-22 DIAGNOSIS — Z7951 Long term (current) use of inhaled steroids: Secondary | ICD-10-CM | POA: Insufficient documentation

## 2014-07-22 DIAGNOSIS — Z8719 Personal history of other diseases of the digestive system: Secondary | ICD-10-CM | POA: Insufficient documentation

## 2014-07-22 DIAGNOSIS — R0989 Other specified symptoms and signs involving the circulatory and respiratory systems: Secondary | ICD-10-CM | POA: Diagnosis not present

## 2014-07-22 MED ORDER — DEXAMETHASONE 6 MG PO TABS
6.0000 mg | ORAL_TABLET | Freq: Once | ORAL | Status: AC
  Administered 2014-07-22: 6 mg via ORAL
  Filled 2014-07-22: qty 1

## 2014-07-22 NOTE — Discharge Instructions (Signed)
Your chest xray is normal Please use you Advair inhaler on a regular basis and you Albuterol as needed as a "rescue" inhaler in between  You were given a dose of Decadron to help decrease the swelling and irritation in your throat that is contributing to the cough

## 2014-07-22 NOTE — ED Provider Notes (Signed)
CSN: [id number]     Arrival date & time 07/22/14  2205 History  This chart was scribed for Garald Balding, PA-C, working with Ephraim Hamburger, MD found by Starleen Arms, ED Scribe. This patient was seen in room WTR6/WTR6 and the patient's care was started at 10:35 PM.  Chief Complaint  Patient presents with  . Shortness of Breath  . Cough   Patient is a 68 y.o. female presenting with cough. The history is provided by the patient. No language interpreter was used.  Cough Cough characteristics:  Non-productive Severity:  Moderate Onset quality:  Gradual Timing:  Intermittent Progression:  Worsening Chronicity:  New Smoker: no   Relieved by:  Nothing Worsened by:  Activity Ineffective treatments:  Beta-agonist inhaler Associated symptoms: no fever, no rhinorrhea, no shortness of breath, no sinus congestion, no sore throat and no wheezing    HPI Comments: Samantha Davidson is a 68 y.o. female who presents to the Emergency Department complaining of 10 days of cough with associated mild SOB and resolved rhinorrhea.  She reports a sensation of "tickling" in the back of her throat causes her to cough.  She reports that she has been using her prescribed albuterol infrequently and Advair not at all until tonight.  She reports that this episode is different from a typical asthma attack.   Past Medical History  Diagnosis Date  . Allergy   . Asthma   . Depression   . GERD (gastroesophageal reflux disease)   . History of DVT of lower extremity   . Hypertension   . Lymphocytic colitis   . Rectal polyp   . Tubular adenoma of colon    Past Surgical History  Procedure Laterality Date  . Foot surgery  1988    pin in both great toes  . Colonoscopy    . Tonsillectomy and adenoidectomy     Family History  Problem Relation Age of Onset  . Arthritis Maternal Grandmother   . Lung cancer Mother   . Heart disease Mother   . Mental illness Mother   . Cancer Father    History  Substance  Use Topics  . Smoking status: Former Research scientist (life sciences)  . Smokeless tobacco: Never Used     Comment: only when stressed  . Alcohol Use: Yes   OB History    No data available     Review of Systems  Constitutional: Negative for fever.  HENT: Positive for voice change. Negative for rhinorrhea, sore throat and trouble swallowing.   Respiratory: Positive for cough. Negative for shortness of breath, wheezing and stridor.   Gastrointestinal: Negative for nausea and vomiting.  Skin: Negative for wound.      Allergies  Codeine  Home Medications   Prior to Admission medications   Medication Sig Start Date End Date Taking? Authorizing Provider  acetaminophen (TYLENOL) 500 MG tablet Take 500 mg by mouth every 6 (six) hours as needed.    Historical Provider, MD  aspirin 81 MG EC tablet Take 81 mg by mouth daily.      Historical Provider, MD  buPROPion (WELLBUTRIN XL) 300 MG 24 hr tablet TAKE 1 TABLET (300 MG TOTAL) BY MOUTH EVERY MORNING.--OFFICE VISIT DUE NOW 06/20/14   Rosalita Chessman, DO  celecoxib (CELEBREX) 200 MG capsule TAKE 1 CAPSULE (200 MG TOTAL) BY MOUTH 2 (TWO) TIMES DAILY. 07/09/14   Rosalita Chessman, DO  Cholecalciferol (VITAMIN D) 2000 UNITS CAPS Take 2 capsules by mouth daily.      Historical Provider,  MD  citalopram (CELEXA) 10 MG tablet Take 1 tablet (10 mg total) by mouth daily. Office visit due now 07/15/14   Rosalita Chessman, DO  EPINEPHrine (EPIPEN) 0.3 mg/0.3 mL DEVI Inject 0.3 mLs (0.3 mg total) into the muscle once. 05/17/11   Rosalita Chessman, DO  fexofenadine (ALLEGRA) 180 MG tablet Take 1 tablet (180 mg total) by mouth daily. 07/27/11   Rosalita Chessman, DO  Fluticasone-Salmeterol (ADVAIR DISKUS) 250-50 MCG/DOSE AEPB Inhale 1 puff into the lungs 2 (two) times daily. 04/26/11   Rosalita Chessman, DO  hydrochlorothiazide (HYDRODIURIL) 25 MG tablet TAKE 1 TABLET EVERY DAY 10/29/13   Rosalita Chessman, DO  losartan (COZAAR) 50 MG tablet TAKE 1 TABLET (50 MG TOTAL) BY MOUTH DAILY. 10/29/13   Rosalita Chessman, DO  Multiple Vitamin (MULTIVITAMIN PO) Take 1 tablet by mouth daily.      Historical Provider, MD  traZODone (DESYREL) 50 MG tablet Take 1/4 tab 04/26/11   Alferd Apa Lowne, DO   BP 149/73 mmHg  Pulse 79  Temp(Src) 97.9 F (36.6 C) (Oral)  Resp 24  SpO2 96% Physical Exam  Constitutional: She is oriented to person, place, and time. She appears well-developed and well-nourished.  HENT:  Head: Normocephalic and atraumatic.  Mouth/Throat: Oropharynx is clear and moist.  Eyes: Pupils are equal, round, and reactive to light.  Neck: Normal range of motion.  Cardiovascular: Normal rate and regular rhythm.   Pulmonary/Chest: Effort normal and breath sounds normal. No respiratory distress. She has no wheezes. She exhibits no tenderness.  Musculoskeletal: Normal range of motion.  Lymphadenopathy:    She has no cervical adenopathy.  Neurological: She is alert and oriented to person, place, and time.  Skin: Skin is warm and dry. No erythema.  Vitals reviewed.   ED Course  Procedures (including critical care time)  DIAGNOSTIC STUDIES: Oxygen Saturation is 96% on RA, normal by my interpretation.    COORDINATION OF CARE:    Labs Review Labs Reviewed - No data to display  Imaging Review No results found.   EKG Interpretation None      MDM  X ray normal patient given dose of Decadron to decrease swelling and irritation To see PCP tomorrow as scheduled  Final diagnoses:  None   I personally performed the services described in this documentation, which was scribed in my presence. The recorded information has been reviewed and is accurate.   Garald Balding, NP 07/22/14 [place], MD 07/23/14 [phone]

## 2014-07-22 NOTE — ED Notes (Signed)
Pt presents with c/o cough and shortness of breath. Pt reports she has had these symptoms for 10 days because she has been sick. Pt reports that she has asthma and has been having to use her inhaler.

## 2014-07-23 ENCOUNTER — Ambulatory Visit (INDEPENDENT_AMBULATORY_CARE_PROVIDER_SITE_OTHER): Payer: Medicare Other | Admitting: Family Medicine

## 2014-07-23 ENCOUNTER — Encounter: Payer: Self-pay | Admitting: Family Medicine

## 2014-07-23 VITALS — BP 162/82 | HR 82 | Temp 99.4°F | Wt 179.2 lb

## 2014-07-23 DIAGNOSIS — J208 Acute bronchitis due to other specified organisms: Secondary | ICD-10-CM | POA: Diagnosis not present

## 2014-07-23 MED ORDER — METHYLPREDNISOLONE ACETATE 80 MG/ML IJ SUSP
80.0000 mg | Freq: Once | INTRAMUSCULAR | Status: AC
  Administered 2014-07-23: 80 mg via INTRAMUSCULAR

## 2014-07-23 MED ORDER — PREDNISONE 10 MG PO TABS: ORAL_TABLET | ORAL | Status: DC

## 2014-07-23 MED ORDER — AZITHROMYCIN 250 MG PO TABS: ORAL_TABLET | ORAL | Status: DC

## 2014-07-23 NOTE — Patient Instructions (Signed)

## 2014-07-23 NOTE — Progress Notes (Signed)
  Subjective:     Samantha Davidson is a 68 y.o. female here for evaluation of a cough. Onset of symptoms was 2 weeks ago. Symptoms have been gradually worsening since that time. The cough is barky and harsh and is aggravated by exercise, infection and reclining position. Associated symptoms include: shortness of breath and wheezing. Patient does have a history of asthma. Patient does have a history of environmental allergens. Patient has not traveled recently. Patient does not have a history of smoking. Patient has not had a previous chest x-ray. Patient has not had a PPD done.  The following portions of the patient's history were reviewed and updated as appropriate: allergies, current medications, past family history, past medical history, past social history, past surgical history and problem list.  Review of Systems Pertinent items are noted in HPI.    Objective:    Oxygen saturation 97% on room air BP 162/82 mmHg  Pulse 82  Temp(Src) 99.4 F (37.4 C) (Oral)  Wt 179 lb 3.2 oz (81.285 kg)  SpO2 97% General appearance: alert, cooperative, appears stated age and no distress Ears: normal TM's and external ear canals both ears and normal TM&#39;s and external ear canals both ears Nose: Nares normal. Septum midline. Mucosa normal. No drainage or sinus tenderness. Throat: lips, mucosa, and tongue normal; teeth and gums normal Neck: moderate anterior cervical adenopathy, supple, symmetrical, trachea midline and thyroid not enlarged, symmetric, no tenderness/mass/nodules Lungs: diminished breath sounds bilaterally Heart: regular rate and rhythm, S1, S2 normal, no murmur, click, rub or gallop    Assessment:    Acute Bronchitis    Plan:    Antibiotics per medication orders. Antitussives per medication orders. Avoid exposure to tobacco smoke and fumes. B-agonist inhaler. Call if shortness of breath worsens, blood in sputum, change in character of cough, development of fever or  chills, inability to maintain nutrition and hydration. Avoid exposure to tobacco smoke and fumes. Chest x-ray.    1. Acute bronchitis due to other specified organisms   - predniSONE (DELTASONE) 10 MG tablet; 3 po qd for 3 days then 2 po qd for 3 days the 1 po qd for 3 days  Dispense: 18 tablet; Refill: 0 - azithromycin (ZITHROMAX Z-PAK) 250 MG tablet; As directed  Dispense: 6 each; Refill: 0 - methylPREDNISolone acetate (DEPO-MEDROL) injection 80 mg; Inject 1 mL (80 mg total) into the muscle once.

## 2014-07-23 NOTE — Progress Notes (Signed)
Pre visit review using our clinic review tool, if applicable. No additional management support is needed unless otherwise documented below in the visit note. 

## 2014-07-24 ENCOUNTER — Encounter: Payer: Self-pay | Admitting: Family Medicine

## 2014-07-26 ENCOUNTER — Other Ambulatory Visit: Payer: Self-pay

## 2014-07-26 MED ORDER — BUPROPION HCL ER (XL) 300 MG PO TB24: ORAL_TABLET | ORAL | Status: DC

## 2014-09-15 ENCOUNTER — Other Ambulatory Visit: Payer: Self-pay | Admitting: Family Medicine

## 2014-10-10 ENCOUNTER — Other Ambulatory Visit: Payer: Self-pay

## 2014-10-10 ENCOUNTER — Encounter: Payer: Self-pay | Admitting: Family Medicine

## 2014-10-10 MED ORDER — CITALOPRAM HYDROBROMIDE 20 MG PO TABS: 20.0000 mg | ORAL_TABLET | Freq: Every day | ORAL | Status: DC

## 2014-10-10 NOTE — Telephone Encounter (Signed)
Inc celexa to 20 mg #30  1 po qd, 2 refills

## 2014-11-25 ENCOUNTER — Other Ambulatory Visit: Payer: Self-pay | Admitting: Family Medicine

## 2014-12-16 ENCOUNTER — Ambulatory Visit (INDEPENDENT_AMBULATORY_CARE_PROVIDER_SITE_OTHER): Payer: Medicare Other | Admitting: Medical

## 2014-12-16 ENCOUNTER — Encounter: Payer: Self-pay | Admitting: Medical

## 2014-12-16 ENCOUNTER — Ambulatory Visit (HOSPITAL_BASED_OUTPATIENT_CLINIC_OR_DEPARTMENT_OTHER)
Admission: RE | Admit: 2014-12-16 | Discharge: 2014-12-16 | Disposition: A | Discharge status: Home / Self Care | Payer: Medicare Other | Source: Ambulatory Visit | Attending: Medical | Admitting: Medical

## 2014-12-16 VITALS — BP 176/85 | HR 77 | Temp 99.2°F | Ht 63.0 in | Wt 175.6 lb

## 2014-12-16 DIAGNOSIS — M25551 Pain in right hip: Secondary | ICD-10-CM

## 2014-12-16 DIAGNOSIS — M25571 Pain in right ankle and joints of right foot: Secondary | ICD-10-CM | POA: Diagnosis not present

## 2014-12-16 DIAGNOSIS — M7731 Calcaneal spur, right foot: Secondary | ICD-10-CM | POA: Diagnosis not present

## 2014-12-16 DIAGNOSIS — I10 Essential (primary) hypertension: Secondary | ICD-10-CM

## 2014-12-16 DIAGNOSIS — M79604 Pain in right leg: Secondary | ICD-10-CM | POA: Diagnosis not present

## 2014-12-16 DIAGNOSIS — M79671 Pain in right foot: Secondary | ICD-10-CM | POA: Diagnosis not present

## 2014-12-16 DIAGNOSIS — T148 Other injury of unspecified body region: Secondary | ICD-10-CM | POA: Diagnosis not present

## 2014-12-16 DIAGNOSIS — M79661 Pain in right lower leg: Secondary | ICD-10-CM | POA: Diagnosis not present

## 2014-12-16 DIAGNOSIS — S3993XA Unspecified injury of pelvis, initial encounter: Secondary | ICD-10-CM | POA: Diagnosis not present

## 2014-12-16 DIAGNOSIS — M7989 Other specified soft tissue disorders: Secondary | ICD-10-CM | POA: Insufficient documentation

## 2014-12-16 DIAGNOSIS — S99921A Unspecified injury of right foot, initial encounter: Secondary | ICD-10-CM | POA: Diagnosis not present

## 2014-12-16 DIAGNOSIS — S99911A Unspecified injury of right ankle, initial encounter: Secondary | ICD-10-CM | POA: Diagnosis not present

## 2014-12-16 DIAGNOSIS — M79606 Pain in leg, unspecified: Secondary | ICD-10-CM | POA: Insufficient documentation

## 2014-12-16 DIAGNOSIS — M25579 Pain in unspecified ankle and joints of unspecified foot: Secondary | ICD-10-CM | POA: Insufficient documentation

## 2014-12-16 DIAGNOSIS — S8991XA Unspecified injury of right lower leg, initial encounter: Secondary | ICD-10-CM | POA: Diagnosis not present

## 2014-12-16 DIAGNOSIS — T07XXXA Unspecified multiple injuries, initial encounter: Secondary | ICD-10-CM | POA: Insufficient documentation

## 2014-12-16 DIAGNOSIS — M25559 Pain in unspecified hip: Secondary | ICD-10-CM | POA: Insufficient documentation

## 2014-12-16 NOTE — Progress Notes (Signed)
Pre visit review using our clinic review tool, if applicable. No additional management support is needed unless otherwise documented below in the visit note. 
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
## 2014-12-16 NOTE — Patient Instructions (Addendum)
Pain in joint, pelvic region and thigh Rt iliac spine pain. Will get xray r/o fx.  Pt states will take advil for pain.   Lower extremity pain Xray of tibia. Advil for pain.  Watch for any popliteal pain. If such occurs notify us and would get lower ext [place].   Pain in joint, ankle and foot Xray of foot and ankle.   Abrasions of multiple sites Rt forearm and left prebibial area Triple antibiotic otc twice daily. If infecious signs or symptoms occur let us know.   HTN (hypertension) Pt will check her bp at home. If bp is 140/90 or higher she will double up on her losartan and notify us.     Follow up in 7 days or as needed.

## 2014-12-16 NOTE — Assessment & Plan Note (Signed)
Rt iliac spine pain. Will get xray r/o fx.  Pt states will take advil for pain.

## 2014-12-16 NOTE — Assessment & Plan Note (Signed)
Xray of foot and ankle.

## 2014-12-16 NOTE — Assessment & Plan Note (Addendum)
Xray of tibia. Advil for pain.  Watch for any popliteal pain. If such occurs notify us and would get lower ext [place].

## 2014-12-16 NOTE — Assessment & Plan Note (Signed)
Rt forearm and left prebibial area Triple antibiotic otc twice daily. If infecious signs or symptoms occur let us know.

## 2014-12-16 NOTE — Assessment & Plan Note (Signed)
Pt will check her bp at home. If bp is 140/90 or higher she will double up on her losartan and notify us.

## 2014-12-23 ENCOUNTER — Ambulatory Visit (INDEPENDENT_AMBULATORY_CARE_PROVIDER_SITE_OTHER): Payer: Medicare Other | Admitting: Internal Medicine

## 2014-12-23 ENCOUNTER — Encounter: Payer: Self-pay | Admitting: Internal Medicine

## 2014-12-23 ENCOUNTER — Ambulatory Visit (HOSPITAL_BASED_OUTPATIENT_CLINIC_OR_DEPARTMENT_OTHER)
Admission: RE | Admit: 2014-12-23 | Discharge: 2014-12-23 | Disposition: A | Discharge status: Home / Self Care | Payer: Medicare Other | Source: Ambulatory Visit | Attending: Internal Medicine | Admitting: Internal Medicine

## 2014-12-23 VITALS — BP 118/68 | HR 72 | Temp 98.1°F | Ht 63.0 in | Wt 173.1 lb

## 2014-12-23 DIAGNOSIS — Z23 Encounter for immunization: Secondary | ICD-10-CM

## 2014-12-23 DIAGNOSIS — M25571 Pain in right ankle and joints of right foot: Secondary | ICD-10-CM

## 2014-12-23 DIAGNOSIS — M7989 Other specified soft tissue disorders: Secondary | ICD-10-CM | POA: Diagnosis not present

## 2014-12-23 DIAGNOSIS — M25572 Pain in left ankle and joints of left foot: Secondary | ICD-10-CM | POA: Diagnosis not present

## 2014-12-23 DIAGNOSIS — S8991XA Unspecified injury of right lower leg, initial encounter: Secondary | ICD-10-CM | POA: Diagnosis not present

## 2014-12-23 DIAGNOSIS — R609 Edema, unspecified: Secondary | ICD-10-CM

## 2014-12-23 DIAGNOSIS — L03115 Cellulitis of right lower limb: Secondary | ICD-10-CM | POA: Diagnosis not present

## 2014-12-23 MED ORDER — TRAMADOL HCL 50 MG PO TBDP: 1.0000 | ORAL_TABLET | Freq: Two times a day (BID) | ORAL | Status: DC | PRN

## 2014-12-23 MED ORDER — DOXYCYCLINE HYCLATE 100 MG PO TABS: 100.0000 mg | ORAL_TABLET | Freq: Two times a day (BID) | ORAL | Status: DC
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
## 2014-12-23 NOTE — Progress Notes (Signed)
Pre visit review using our clinic review tool, if applicable. No additional management support is needed unless otherwise documented below in the visit note. 

## 2014-12-23 NOTE — Patient Instructions (Signed)
Take antibiotic as prescribed We are scheduling a ultrasound of the leg  to be done today  Leg elevation For pain take Tylenol 500 mg 2 tablets 3 times a day as needed If the pain is severe take 1 tramadol no more than twice a day, watch for agitation, muscle spasms, rigidity, shakiness.  If the infection is not getting better in the next few days or if you have fever, chills or you've worse: Seek medical attention  Follow-up with your primary doctor in 10-14 days

## 2015-01-07 ENCOUNTER — Ambulatory Visit (INDEPENDENT_AMBULATORY_CARE_PROVIDER_SITE_OTHER): Payer: Medicare Other | Admitting: Family Medicine

## 2015-01-07 ENCOUNTER — Encounter: Payer: Self-pay | Admitting: Family Medicine

## 2015-01-07 VITALS — BP 130/74 | HR 76 | Temp 99.4°F | Resp 18 | Ht 63.0 in | Wt 177.0 lb

## 2015-01-07 DIAGNOSIS — R609 Edema, unspecified: Secondary | ICD-10-CM

## 2015-01-07 DIAGNOSIS — L03115 Cellulitis of right lower limb: Secondary | ICD-10-CM | POA: Diagnosis not present

## 2015-01-07 MED ORDER — FUROSEMIDE 20 MG PO TABS: 20.0000 mg | ORAL_TABLET | Freq: Every day | ORAL | Status: DC

## 2015-01-07 MED ORDER — CEPHALEXIN 500 MG PO CAPS: 500.0000 mg | ORAL_CAPSULE | Freq: Two times a day (BID) | ORAL | Status: DC

## 2015-01-07 NOTE — Progress Notes (Signed)
Subjective:    Patient ID: Samantha Davidson, female    DOB: Mar 25, 1946, 69 y.o.   MRN: [id number]  HPI  Patient here for f/u cellulitis R low ext.  It is better but still red.  abx made pt have diarrhea--- previous 2 ov reviewed  Past Medical History  Diagnosis Date  . Allergy   . Asthma   . Depression   . GERD (gastroesophageal reflux disease)   . History of DVT of lower extremity   . Hypertension   . Lymphocytic colitis   . Rectal polyp   . Tubular adenoma of colon     Review of Systems  Constitutional: Negative for activity change, appetite change, fatigue and unexpected weight change.  Respiratory: Negative for cough and shortness of breath.   Cardiovascular: Negative for chest pain and palpitations.  Skin: Positive for color change and wound.       + errythema- improved r low leg No drainage + pitting edema R LE  Psychiatric/Behavioral: Negative for behavioral problems and dysphoric mood. The patient is not nervous/anxious.     Current Outpatient Prescriptions on File Prior to Visit  Medication Sig Dispense Refill  . acetaminophen (TYLENOL) 500 MG tablet Take 500 mg by mouth every 6 (six) hours as needed (for pain).     Marland Kitchen aspirin 81 MG EC tablet Take 81 mg by mouth daily.      Marland Kitchen buPROPion (WELLBUTRIN XL) 300 MG 24 hr tablet TAKE 1 TABLET (300 MG TOTAL) BY MOUTH EVERY MORNING 30 tablet 5  . Cholecalciferol (VITAMIN D) 2000 UNITS CAPS Take 2 capsules by mouth daily.      . citalopram (CELEXA) 20 MG tablet Take 1 tablet (20 mg total) by mouth daily. 30 tablet 2  . Diclofenac Sodium 1.5 % SOLN Apply 1 application topically 2 (two) times daily as needed. For pain.  0  . EPINEPHrine (EPIPEN) 0.3 mg/0.3 mL DEVI Inject 0.3 mLs (0.3 mg total) into the muscle once. 1 Device 1  . fexofenadine (ALLEGRA) 180 MG tablet Take 1 tablet (180 mg total) by mouth daily. 30 tablet 11  . Fluticasone-Salmeterol (ADVAIR DISKUS) 250-50 MCG/DOSE AEPB Inhale 1 puff into the lungs 2 (two)  times daily. (Patient taking differently: Inhale 1 puff into the lungs 2 (two) times daily as needed (for shortness of breath). ) 60 each 5  . losartan (COZAAR) 50 MG tablet TAKE 1 TABLET BY MOUTH EVERY DAY 30 tablet 2  . Multiple Vitamin (MULTIVITAMIN PO) Take 1 tablet by mouth daily.      . TraMADol HCl 50 MG TBDP Take 1 tablet by mouth 2 (two) times daily as needed. 15 tablet 0  . traZODone (DESYREL) 50 MG tablet Take 1/4 tab (Patient taking differently: Take 12.5-25 mg by mouth at bedtime as needed for sleep. ) 30 tablet 2  . doxycycline (VIBRA-TABS) 100 MG tablet Take 1 tablet (100 mg total) by mouth 2 (two) times daily. (Patient not taking: Reported on 01/07/2015) 20 tablet 0   No current facility-administered medications on file prior to visit.       Objective:    Physical Exam  Constitutional: She is oriented to person, place, and time. She appears well-developed and well-nourished.  HENT:  Head: Normocephalic and atraumatic.  Eyes: Conjunctivae and EOM are normal.  Neck: Normal range of motion. Neck supple. No JVD present. Carotid bruit is not present. No thyromegaly present.  Cardiovascular: Normal rate, regular rhythm and normal heart sounds.   No murmur heard. Pulmonary/Chest:  Effort normal and breath sounds normal. No respiratory distress. She has no wheezes. She has no rales. She exhibits no tenderness.  Musculoskeletal: She exhibits no edema.  Neurological: She is alert and oriented to person, place, and time.  Skin: Lesion noted. There is erythema.     Psychiatric: She has a normal mood and affect. Her behavior is normal. Judgment and thought content normal.    BP 130/74 mmHg  Pulse 76  Temp(Src) 99.4 F (37.4 C) (Oral)  Resp 18  Ht 5\' 3"  (1.6 m)  Wt 177 lb (80.287 kg)  BMI 31.36 kg/m2  SpO2 96% Wt Readings from Last 3 Encounters:  01/07/15 177 lb (80.287 kg)  12/23/14 173 lb 2 oz (78.529 kg)  12/16/14 175 lb 9.6 oz (79.652 kg)     Lab Results  Component  Value Date   WBC 6.6 09/21/2013   HGB 13.2 09/21/2013   HCT 40.1 09/21/2013   PLT 275.0 09/21/2013   GLUCOSE 103* 09/21/2013   CHOL 261* 09/21/2013   TRIG 60.0 09/21/2013   HDL 104.20 09/21/2013   LDLDIRECT 148.0 09/21/2013   ALT 17 09/21/2013   AST 19 09/21/2013   NA 139 09/21/2013   K 4.1 09/21/2013   CL 103 09/21/2013   CREATININE 0.8 09/21/2013   BUN 19 09/21/2013   CO2 31 09/21/2013   TSH 1.38 09/21/2013       Assessment & Plan:   Problem List Items Addressed This Visit    None    Visit Diagnoses    Edema    -  Primary    Relevant Medications    furosemide (LASIX) 20 MG tablet    Cellulitis of leg, right        Relevant Medications    cephALEXin (KEFLEX) 500 MG capsule       I have discontinued Ms. Joyce-Jackson's hydrochlorothiazide. I am also having her start on furosemide and cephALEXin. Additionally, I am having her maintain her aspirin, Vitamin D, Multiple Vitamin (MULTIVITAMIN PO), traZODone, Fluticasone-Salmeterol, EPINEPHrine, fexofenadine, acetaminophen, Diclofenac Sodium, buPROPion, citalopram, losartan, doxycycline, and TraMADol HCl.  Meds ordered this encounter  Medications  . furosemide (LASIX) 20 MG tablet    Sig: Take 1 tablet (20 mg total) by mouth daily.    Dispense:  30 tablet    Refill:  3  . cephALEXin (KEFLEX) 500 MG capsule    Sig: Take 1 capsule (500 mg total) by mouth 2 (two) times daily.    Dispense:  20 capsule    Refill:  0     Garnet Koyanagi, DO

## 2015-01-07 NOTE — Progress Notes (Signed)
Pre visit review using our clinic review tool, if applicable. No additional management support is needed unless otherwise documented below in the visit note. 

## 2015-01-07 NOTE — Patient Instructions (Signed)

## 2015-01-14 ENCOUNTER — Encounter: Payer: Self-pay | Admitting: Family Medicine

## 2015-01-14 DIAGNOSIS — M25571 Pain in right ankle and joints of right foot: Secondary | ICD-10-CM

## 2015-01-14 NOTE — Telephone Encounter (Signed)
WE CAN REFER TO SPORT MED

## 2015-01-15 ENCOUNTER — Ambulatory Visit (INDEPENDENT_AMBULATORY_CARE_PROVIDER_SITE_OTHER): Payer: Medicare Other | Admitting: Family Medicine

## 2015-01-15 ENCOUNTER — Encounter: Payer: Self-pay | Admitting: Family Medicine

## 2015-01-15 VITALS — BP 161/78 | HR 69 | Ht 64.0 in | Wt 170.0 lb

## 2015-01-15 DIAGNOSIS — M25571 Pain in right ankle and joints of right foot: Secondary | ICD-10-CM
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
## 2015-01-15 NOTE — Assessment & Plan Note (Signed)
swelling is her dominant issue right now.  Ligaments are intact on exam and radiographs negative, doppler u/s negative.  Believe most of swelling is due to her infection, soft tissue injury, and lymphatic vessel disruption.  Discussed vitamin C as a precaution for RSD though doesn't have this appearance currently.  Compression, continue lasix.  Elevation.  F/u in 1 month.  Consider MRI if not improving though expect this to be normal based on her exam.

## 2015-01-15 NOTE — Patient Instructions (Signed)
Your swelling is likely related to lymphatic vessel disruption and the infection. Reflex sympathetic dystrophy is a consideration but unlikely given the appearance. As a precaution take vitamin C 500mg  daily. Elevate above your heart. Compression stockings or ACE wrap to keep swelling down as well as icing 15 minutes at a time 3-4 times a day. Continue with the fluid pill and your antibiotics. Follow up with me in 1 month. Would consider an MRI if you're still having issues but based on your history and exam this is likely to be normal.

## 2015-01-20 ENCOUNTER — Ambulatory Visit: Payer: Medicare Other | Admitting: Family Medicine

## 2015-02-03 ENCOUNTER — Other Ambulatory Visit: Payer: Self-pay

## 2015-02-05 ENCOUNTER — Other Ambulatory Visit: Payer: Self-pay | Admitting: Family Medicine

## 2015-02-05 DIAGNOSIS — Z1231 Encounter for screening mammogram for malignant neoplasm of breast: Secondary | ICD-10-CM

## 2015-02-17 ENCOUNTER — Ambulatory Visit: Payer: Medicare Other | Admitting: Family Medicine

## 2015-02-18 ENCOUNTER — Ambulatory Visit (INDEPENDENT_AMBULATORY_CARE_PROVIDER_SITE_OTHER): Payer: Medicare Other | Admitting: Family Medicine

## 2015-02-18 ENCOUNTER — Encounter: Payer: Self-pay | Admitting: Family Medicine

## 2015-02-18 VITALS — BP 137/78 | HR 76 | Ht 64.0 in | Wt 169.0 lb

## 2015-02-18 DIAGNOSIS — M25571 Pain in right ankle and joints of right foot: Secondary | ICD-10-CM | POA: Diagnosis not present

## 2015-02-21 NOTE — Assessment & Plan Note (Signed)
swelling continues to persist though from an orthopedic standpoint I do not think anything musculoskeletal is causing this.  Again discussed swelling likely sequelae of infection, soft tissue injury, and lymphatic vessel disruption.  Can stop vitamin C - does not have RSD appearance.  Compression, continue lasix.  Elevation.  F/u with PCP moving forward.

## 2015-02-21 NOTE — Progress Notes (Signed)
PCP and referred by: Garnet Koyanagi, DO  Subjective:   HPI: Patient is a 69 y.o. female here for right ankle swelling.  6/8: Patient reports on 5/8 she was pulling a boat cover off a pontoon boat and fell 5 feet onto rocks. Cut up lateral and anterior lower leg. Believes she may have turned ankle as well. + swelling. Developed cellulitis as well and now on second round of antibiotics. Radiographs of tibia/fibula, ankle, foot negative. Doppler u/s negative for DVT. Has soreness and stinging mainly laterally.  7/14: Patient reports overall she's doing well - primary issue is persistent swelling of the foot. Sometimes is tender. Pain now 0/10 level. She also asked me about a bump on right 4th PIP joint- ultrasound showed this to be a cyst.  Past Medical History  Diagnosis Date  . Allergy   . Asthma   . Depression   . GERD (gastroesophageal reflux disease)   . History of DVT of lower extremity   . Hypertension   . Lymphocytic colitis   . Rectal polyp   . Tubular adenoma of colon     Current Outpatient Prescriptions on File Prior to Visit  Medication Sig Dispense Refill  . acetaminophen (TYLENOL) 500 MG tablet Take 500 mg by mouth every 6 (six) hours as needed (for pain).     Marland Kitchen aspirin 81 MG EC tablet Take 81 mg by mouth daily.      Marland Kitchen buPROPion (WELLBUTRIN XL) 300 MG 24 hr tablet TAKE 1 TABLET (300 MG TOTAL) BY MOUTH EVERY MORNING 30 tablet 5  . cephALEXin (KEFLEX) 500 MG capsule Take 1 capsule (500 mg total) by mouth 2 (two) times daily. 20 capsule 0  . Cholecalciferol (VITAMIN D) 2000 UNITS CAPS Take 2 capsules by mouth daily.      . citalopram (CELEXA) 20 MG tablet Take 1 tablet (20 mg total) by mouth daily. 30 tablet 2  . Diclofenac Sodium 1.5 % SOLN Apply 1 application topically 2 (two) times daily as needed. For pain.  0  . doxycycline (VIBRA-TABS) 100 MG tablet Take 1 tablet (100 mg total) by mouth 2 (two) times daily. (Patient not taking: Reported on 01/07/2015) 20 tablet  0  . EPINEPHrine (EPIPEN) 0.3 mg/0.3 mL DEVI Inject 0.3 mLs (0.3 mg total) into the muscle once. 1 Device 1  . fexofenadine (ALLEGRA) 180 MG tablet Take 1 tablet (180 mg total) by mouth daily. 30 tablet 11  . Fluticasone-Salmeterol (ADVAIR DISKUS) 250-50 MCG/DOSE AEPB Inhale 1 puff into the lungs 2 (two) times daily. (Patient taking differently: Inhale 1 puff into the lungs 2 (two) times daily as needed (for shortness of breath). ) 60 each 5  . furosemide (LASIX) 20 MG tablet Take 1 tablet (20 mg total) by mouth daily. 30 tablet 3  . losartan (COZAAR) 50 MG tablet TAKE 1 TABLET BY MOUTH EVERY DAY 30 tablet 2  . Multiple Vitamin (MULTIVITAMIN PO) Take 1 tablet by mouth daily.      . TraMADol HCl 50 MG TBDP Take 1 tablet by mouth 2 (two) times daily as needed. 15 tablet 0  . traZODone (DESYREL) 50 MG tablet Take 1/4 tab (Patient taking differently: Take 12.5-25 mg by mouth at bedtime as needed for sleep. ) 30 tablet 2   No current facility-administered medications on file prior to visit.    Past Surgical History  Procedure Laterality Date  . Foot surgery  1988    pin in both great toes  . Colonoscopy    .  Tonsillectomy and adenoidectomy      Allergies  Allergen Reactions  . Codeine Nausea And Vomiting  . Doxycycline Diarrhea    History   Social History  . Marital Status: Married    Spouse Name: N/A  . Number of Children: 2  . Years of Education: N/A   Occupational History  . interior designer   .     Social History Main Topics  . Smoking status: Former Research scientist (life sciences)  . Smokeless tobacco: Never Used     Comment: only when stressed  . Alcohol Use: 0.0 oz/week    0 Standard drinks or equivalent per week  . Drug Use: No  . Sexual Activity: Not on file   Other Topics Concern  . Not on file   Social History Narrative    Family History  Problem Relation Age of Onset  . Arthritis Maternal Grandmother   . Lung cancer Mother   . Heart disease Mother   . Mental illness Mother    . Cancer Father     BP 137/78 mmHg  Pulse 76  Ht 5\' 4"  (1.626 m)  Wt 169 lb (76.658 kg)  BMI 28.99 kg/m2  Review of Systems: See HPI above.    Objective:  Physical Exam:  Gen: NAD  Right ankle: Mild soft tissue swelling.  No bruising, other deformity.  Two well healing lacerations lower leg nearly completely healed, no warmth or purulence.  No erythema. FROM all directions without pain. No TTP lower leg, ankle/foot now. Negative ant drawer and talar tilt.   Negative syndesmotic compression. Thompsons test negative. NV intact distally.    Assessment & Plan:  1. Right ankle pain, swelling - swelling continues to persist though from an orthopedic standpoint I do not think anything musculoskeletal is causing this.  Again discussed swelling likely sequelae of infection, soft tissue injury, and lymphatic vessel disruption.  Can stop vitamin C - does not have RSD appearance.  Compression, continue lasix.  Elevation.  F/u with PCP moving forward.

## 2015-03-18 DIAGNOSIS — H2513 Age-related nuclear cataract, bilateral: Secondary | ICD-10-CM | POA: Diagnosis not present

## 2015-03-18 DIAGNOSIS — H524 Presbyopia: Secondary | ICD-10-CM | POA: Diagnosis not present

## 2015-03-27 ENCOUNTER — Other Ambulatory Visit: Payer: Self-pay | Admitting: Family Medicine

## 2015-04-01 ENCOUNTER — Other Ambulatory Visit: Payer: Self-pay

## 2015-04-01 ENCOUNTER — Encounter: Payer: Self-pay | Admitting: Family Medicine

## 2015-04-01 ENCOUNTER — Ambulatory Visit (INDEPENDENT_AMBULATORY_CARE_PROVIDER_SITE_OTHER): Payer: Medicare Other | Admitting: Family Medicine

## 2015-04-01 VITALS — BP 140/78 | HR 72 | Temp 98.2°F | Wt 178.6 lb

## 2015-04-01 DIAGNOSIS — R197 Diarrhea, unspecified: Secondary | ICD-10-CM

## 2015-04-01 DIAGNOSIS — N390 Urinary tract infection, site not specified: Secondary | ICD-10-CM | POA: Diagnosis not present

## 2015-04-01 DIAGNOSIS — K529 Noninfective gastroenteritis and colitis, unspecified: Secondary | ICD-10-CM

## 2015-04-01 DIAGNOSIS — R8299 Other abnormal findings in urine: Secondary | ICD-10-CM | POA: Diagnosis not present

## 2015-04-01 DIAGNOSIS — R8290 Unspecified abnormal findings in urine: Secondary | ICD-10-CM

## 2015-04-01 DIAGNOSIS — R82998 Other abnormal findings in urine: Secondary | ICD-10-CM

## 2015-04-01 LAB — COMPREHENSIVE METABOLIC PANEL
ALT: 10 U/L (ref 0–35)
AST: 15 U/L (ref 0–37)
Albumin: 4.3 g/dL (ref 3.5–5.2)
Alkaline Phosphatase: 59 U/L (ref 39–117)
BUN: 19 mg/dL (ref 6–23)
CO2: 32 mEq/L (ref 19–32)
Calcium: 9.7 mg/dL (ref 8.4–10.5)
Chloride: 104 mEq/L (ref 96–112)
Creatinine, Ser: 0.98 mg/dL (ref 0.40–1.20)
GFR: 59.98 mL/min — ABNORMAL LOW (ref >60.00)
Glucose, Bld: 65 mg/dL — ABNORMAL LOW (ref 70–99)
Potassium: 4.6 mEq/L (ref 3.5–5.1)
Sodium: 142 mEq/L (ref 135–145)
Total Bilirubin: 0.4 mg/dL (ref 0.2–1.2)
Total Protein: 6.6 g/dL (ref 6.0–8.3)

## 2015-04-01 LAB — CBC WITH DIFFERENTIAL/PLATELET
Basophils Absolute: 0 10*3/uL (ref 0.0–0.1)
Basophils Relative: 0.4 % (ref 0.0–3.0)
Eosinophils Absolute: 0.1 10*3/uL (ref 0.0–0.7)
Eosinophils Relative: 1.8 % (ref 0.0–5.0)
HCT: 38.5 % (ref 36.0–46.0)
Hemoglobin: 13.1 g/dL (ref 12.0–15.0)
Lymphocytes Relative: 25.7 % (ref 12.0–46.0)
Lymphs Abs: 1.8 10*3/uL (ref 0.7–4.0)
MCHC: 34.1 g/dL (ref 30.0–36.0)
MCV: 98.6 fl (ref 78.0–100.0)
Monocytes Absolute: 0.6 10*3/uL (ref 0.1–1.0)
Monocytes Relative: 8 % (ref 3.0–12.0)
Neutro Abs: 4.6 10*3/uL (ref 1.4–7.7)
Neutrophils Relative %: 64.1 % (ref 43.0–77.0)
Platelets: 238 10*3/uL (ref 150.0–400.0)
RBC: 3.9 Mil/uL (ref 3.87–5.11)
RDW: 13.7 % (ref 11.5–15.5)
WBC: 7.1 10*3/uL (ref 4.0–10.5)

## 2015-04-01 LAB — POCT URINALYSIS DIPSTICK
Bilirubin, UA: NEGATIVE
Blood, UA: NEGATIVE
Glucose, UA: NEGATIVE
Ketones, UA: NEGATIVE
Nitrite, UA: NEGATIVE
Protein, UA: NEGATIVE
Spec Grav, UA: 1.025
Urobilinogen, UA: 0.2
pH, UA: 6

## 2015-04-01 MED ORDER — BUPROPION HCL ER (XL) 300 MG PO TB24: ORAL_TABLET | ORAL | Status: DC

## 2015-04-01 MED ORDER — LOSARTAN POTASSIUM 100 MG PO TABS: 100.0000 mg | ORAL_TABLET | Freq: Every day | ORAL | Status: DC

## 2015-04-01 MED ORDER — BUDESONIDE 3 MG PO CPEP: 3.0000 mg | ORAL_CAPSULE | Freq: Three times a day (TID) | ORAL | Status: DC

## 2015-04-01 MED ORDER — BUDESONIDE 3 MG PO CPEP: 9.0000 mg | ORAL_CAPSULE | Freq: Every day | ORAL | Status: DC

## 2015-04-01 NOTE — Progress Notes (Signed)
Patient ID: Samantha Davidson, female   DOB: 1946-06-26, 69 y.o.   MRN: [id number]   Subjective:    Patient ID: Samantha Davidson, female    DOB: June 19, 1946, 69 y.o.   MRN: [id number]  Chief Complaint  Patient presents with  . Diarrhea    x's 6 mos that is getting progressively worse.  Says she feels weak    HPI Patient is in today for worsening diarrhea.  She had seen GI in past for this -- dx colitis.  See gi ov from few years ago.    No fever. No N/V.  Some abd discomfort.  Past Medical History  Diagnosis Date  . Allergy   . Asthma   . Depression   . GERD (gastroesophageal reflux disease)   . History of DVT of lower extremity   . Hypertension   . Lymphocytic colitis   . Rectal polyp   . Tubular adenoma of colon     Past Surgical History  Procedure Laterality Date  . Foot surgery  1988    pin in both great toes  . Colonoscopy    . Tonsillectomy and adenoidectomy      Family History  Problem Relation Age of Onset  . Arthritis Maternal Grandmother   . Lung cancer Mother   . Heart disease Mother   . Mental illness Mother   . Cancer Father     Social History   Social History  . Marital Status: Married    Spouse Name: N/A  . Number of Children: 2  . Years of Education: N/A   Occupational History  . interior designer   .     Social History Main Topics  . Smoking status: Former Research scientist (life sciences)  . Smokeless tobacco: Never Used     Comment: only when stressed  . Alcohol Use: 0.0 oz/week    0 Standard drinks or equivalent per week  . Drug Use: No  . Sexual Activity: Not on file   Other Topics Concern  . Not on file   Social History Narrative    Outpatient Prescriptions Prior to Visit  Medication Sig Dispense Refill  . acetaminophen (TYLENOL) 500 MG tablet Take 500 mg by mouth every 6 (six) hours as needed (for pain).     Marland Kitchen aspirin 81 MG EC tablet Take 81 mg by mouth daily.      Marland Kitchen buPROPion (WELLBUTRIN XL) 300 MG 24 hr tablet TAKE 1 TABLET (300 MG  TOTAL) BY MOUTH EVERY MORNING 30 tablet 5  . Cholecalciferol (VITAMIN D) 2000 UNITS CAPS Take 2 capsules by mouth daily.      . citalopram (CELEXA) 10 MG tablet TAKE 1 TABLET (10 MG TOTAL) BY MOUTH DAILY. 30 tablet 5  . Diclofenac Sodium 1.5 % SOLN Apply 1 application topically 2 (two) times daily as needed. For pain.  0  . EPINEPHrine (EPIPEN) 0.3 mg/0.3 mL DEVI Inject 0.3 mLs (0.3 mg total) into the muscle once. 1 Device 1  . fexofenadine (ALLEGRA) 180 MG tablet Take 1 tablet (180 mg total) by mouth daily. 30 tablet 11  . Fluticasone-Salmeterol (ADVAIR DISKUS) 250-50 MCG/DOSE AEPB Inhale 1 puff into the lungs 2 (two) times daily. (Patient taking differently: Inhale 1 puff into the lungs 2 (two) times daily as needed (for shortness of breath). ) 60 each 5  . furosemide (LASIX) 20 MG tablet Take 1 tablet (20 mg total) by mouth daily. 30 tablet 3  . Multiple Vitamin (MULTIVITAMIN PO) Take 1 tablet by mouth daily.      Marland Kitchen  TraMADol HCl 50 MG TBDP Take 1 tablet by mouth 2 (two) times daily as needed. 15 tablet 0  . traZODone (DESYREL) 50 MG tablet Take 1/4 tab (Patient taking differently: Take 12.5-25 mg by mouth at bedtime as needed for sleep. ) 30 tablet 2  . cephALEXin (KEFLEX) 500 MG capsule Take 1 capsule (500 mg total) by mouth 2 (two) times daily. 20 capsule 0  . citalopram (CELEXA) 20 MG tablet Take 1 tablet (20 mg total) by mouth daily. 30 tablet 2  . doxycycline (VIBRA-TABS) 100 MG tablet Take 1 tablet (100 mg total) by mouth 2 (two) times daily. 20 tablet 0  . losartan (COZAAR) 50 MG tablet TAKE 1 TABLET BY MOUTH EVERY DAY 30 tablet 5   No facility-administered medications prior to visit.    Allergies  Allergen Reactions  . Codeine Nausea And Vomiting  . Doxycycline Diarrhea    Review of Systems  Constitutional: Positive for malaise/fatigue. Negative for fever.  HENT: Negative for congestion.   Eyes: Negative for discharge.  Respiratory: Negative for shortness of breath.     Cardiovascular: Negative for chest pain, palpitations and leg swelling.  Gastrointestinal: Positive for diarrhea. Negative for nausea and abdominal pain.  Genitourinary: Negative for dysuria.  Musculoskeletal: Negative for falls.  Skin: Negative for rash.  Neurological: Negative for loss of consciousness and headaches.  Endo/Heme/Allergies: Negative for environmental allergies.  Psychiatric/Behavioral: Negative for depression. The patient is not nervous/anxious.        Objective:    Physical Exam  Constitutional: She is oriented to person, place, and time. She appears well-developed and well-nourished. No distress.  HENT:  Head: Normocephalic and atraumatic.  Right Ear: External ear normal.  Left Ear: External ear normal.  Nose: Nose normal.  Mouth/Throat: Oropharynx is clear and moist.  Eyes: Conjunctivae and EOM are normal. Pupils are equal, round, and reactive to light.  Neck: Normal range of motion. Neck supple. No JVD present. Carotid bruit is not present. No thyromegaly present.  Cardiovascular: Normal rate, regular rhythm and normal heart sounds.   No murmur heard. Pulmonary/Chest: Effort normal and breath sounds normal. No respiratory distress. She has no wheezes. She has no rales. She exhibits no tenderness.  Musculoskeletal: She exhibits no edema.  Neurological: She is alert and oriented to person, place, and time.  Psychiatric: She has a normal mood and affect. Her behavior is normal. Judgment and thought content normal.    BP 140/78 mmHg  Pulse 72  Temp(Src) 98.2 F (36.8 C) (Oral)  Wt 178 lb 9.6 oz (81.012 kg)  SpO2 98% Wt Readings from Last 3 Encounters:  04/01/15 178 lb 9.6 oz (81.012 kg)  02/18/15 169 lb (76.658 kg)  01/15/15 170 lb (77.111 kg)     Lab Results  Component Value Date   WBC 7.1 04/01/2015   HGB 13.1 04/01/2015   HCT 38.5 04/01/2015   PLT 238.0 04/01/2015   GLUCOSE 65* 04/01/2015   CHOL 261* 09/21/2013   TRIG 60.0 09/21/2013   HDL  104.20 09/21/2013   LDLDIRECT 148.0 09/21/2013   ALT 10 04/01/2015   AST 15 04/01/2015   NA 142 04/01/2015   K 4.6 04/01/2015   CL 104 04/01/2015   CREATININE 0.98 04/01/2015   BUN 19 04/01/2015   CO2 32 04/01/2015   TSH 1.38 09/21/2013    Lab Results  Component Value Date   TSH 1.38 09/21/2013   Lab Results  Component Value Date   WBC 7.1 04/01/2015   HGB 13.1  04/01/2015   HCT 38.5 04/01/2015   MCV 98.6 04/01/2015   PLT 238.0 04/01/2015   Lab Results  Component Value Date   NA 142 04/01/2015   K 4.6 04/01/2015   CO2 32 04/01/2015   GLUCOSE 65* 04/01/2015   BUN 19 04/01/2015   CREATININE 0.98 04/01/2015   BILITOT 0.4 04/01/2015   ALKPHOS 59 04/01/2015   AST 15 04/01/2015   ALT 10 04/01/2015   PROT 6.6 04/01/2015   ALBUMIN 4.3 04/01/2015   CALCIUM 9.7 04/01/2015   GFR 59.98* 04/01/2015   Lab Results  Component Value Date   CHOL 261* 09/21/2013   Lab Results  Component Value Date   HDL 104.20 09/21/2013   No results found for: [facility] Lab Results  Component Value Date   TRIG 60.0 09/21/2013   Lab Results  Component Value Date   CHOLHDL 3 09/21/2013   No results found for: HGBA1C     Assessment & Plan:   Problem List Items Addressed This Visit    None    Visit Diagnoses    Colitis    -  Primary    Relevant Medications    budesonide (ENTOCORT EC) 3 MG 24 hr capsule    Other Relevant Orders    CBC with Differential/Platelet (Completed)    Comprehensive metabolic panel (Completed)    POCT urinalysis dipstick (Completed)    Ambulatory referral to Gastroenterology    Stool culture    Clostridium difficile EIA    Diarrhea        Relevant Orders    Stool culture    Clostridium difficile EIA    Urine abnormality        Relevant Orders    Urine Culture    Urine leukocytes        Relevant Orders    Urine Culture       I have discontinued Ms. Joyce-Jackson's doxycycline, cephALEXin, losartan, and budesonide. I am also having her start on  losartan and budesonide. Additionally, I am having her maintain her aspirin, Vitamin D, Multiple Vitamin (MULTIVITAMIN PO), traZODone, Fluticasone-Salmeterol, EPINEPHrine, fexofenadine, acetaminophen, Diclofenac Sodium, TraMADol HCl, furosemide, citalopram, and buPROPion.  Meds ordered this encounter  Medications  . DISCONTD: budesonide (ENTOCORT EC) 3 MG 24 hr capsule    Sig: Take 1 capsule (3 mg total) by mouth 3 (three) times daily with meals. FOR 6 WEEKS    Dispense:  130 capsule    Refill:  0  . losartan (COZAAR) 100 MG tablet    Sig: Take 1 tablet (100 mg total) by mouth daily.    Dispense:  90 tablet    Refill:  1  . budesonide (ENTOCORT EC) 3 MG 24 hr capsule    Sig: Take 3 capsules (9 mg total) by mouth daily.    Dispense:  90 capsule    Refill:  1    D/C PREVIOUS SCRIPTS FOR THIS MEDICATION     Garnet Koyanagi, DO

## 2015-04-01 NOTE — Patient Instructions (Signed)

## 2015-04-01 NOTE — Progress Notes (Signed)
Pre visit review using our clinic review tool, if applicable. No additional management support is needed unless otherwise documented below in the visit note. 

## 2015-04-02 ENCOUNTER — Telehealth: Payer: Self-pay | Admitting: Family Medicine

## 2015-04-02 LAB — URINE CULTURE: Colony Count: 30000

## 2015-04-02 NOTE — Telephone Encounter (Signed)
Pt called because Budesonide was going to be $2000 before insurance and still $700 after insurance. She did not get meds filled obviously. She is asking if there is an alternative.

## 2015-04-03 NOTE — Telephone Encounter (Signed)
Let me see if we can tet her into GI quickly

## 2015-04-03 NOTE — Telephone Encounter (Signed)
Are you calling GI for the patient, per Dustin's note he has tried to contact the patient.     KP

## 2015-04-03 NOTE — Telephone Encounter (Signed)
Detailed message left advising the patient to return the call to GI.     KP

## 2015-04-03 NOTE — Telephone Encounter (Signed)
I asked Delsa Sale to work on it and she said she would --- we need pt to call GI back

## 2015-04-07 ENCOUNTER — Other Ambulatory Visit: Payer: Self-pay | Admitting: Family Medicine

## 2015-04-07 DIAGNOSIS — K529 Noninfective gastroenteritis and colitis, unspecified: Secondary | ICD-10-CM | POA: Diagnosis not present

## 2015-04-07 DIAGNOSIS — R197 Diarrhea, unspecified: Secondary | ICD-10-CM | POA: Diagnosis not present

## 2015-04-10 LAB — C. DIFFICILE GDH AND TOXIN A/B

## 2015-04-11 LAB — STOOL CULTURE

## 2015-04-30 ENCOUNTER — Other Ambulatory Visit: Payer: Self-pay | Admitting: Family Medicine

## 2015-08-20 ENCOUNTER — Telehealth: Payer: Self-pay | Admitting: Family Medicine

## 2015-09-03 NOTE — Telephone Encounter (Signed)
flu

## 2015-09-15 ENCOUNTER — Other Ambulatory Visit: Payer: Self-pay | Admitting: Family Medicine

## 2015-09-15 DIAGNOSIS — Z1231 Encounter for screening mammogram for malignant neoplasm of breast: Secondary | ICD-10-CM

## 2015-09-20 ENCOUNTER — Other Ambulatory Visit: Payer: Self-pay | Admitting: Family Medicine

## 2015-09-22 MED ORDER — LOSARTAN POTASSIUM 100 MG PO TABS: 100.0000 mg | ORAL_TABLET | Freq: Every day | ORAL | Status: DC

## 2015-10-20 ENCOUNTER — Telehealth: Payer: Self-pay | Admitting: Family Medicine

## 2015-10-20 NOTE — Telephone Encounter (Signed)
Patient declined flu shot  °

## 2015-10-20 NOTE — Telephone Encounter (Signed)
Updated chart.     KP

## 2015-10-22 ENCOUNTER — Other Ambulatory Visit: Payer: Self-pay | Admitting: Family Medicine

## 2015-11-04 ENCOUNTER — Telehealth: Payer: Self-pay

## 2015-11-04 MED ORDER — FUROSEMIDE 20 MG PO TABS: 20.0000 mg | ORAL_TABLET | Freq: Every day | ORAL | Status: DC

## 2015-11-04 NOTE — Telephone Encounter (Signed)
Rx faxed.    KP 

## 2015-11-11 ENCOUNTER — Encounter: Payer: Medicare Other | Admitting: Family Medicine

## 2015-12-28 ENCOUNTER — Other Ambulatory Visit: Payer: Self-pay | Admitting: Family Medicine

## 2016-01-12 ENCOUNTER — Other Ambulatory Visit: Payer: Self-pay | Admitting: Family Medicine

## 2016-01-19 ENCOUNTER — Other Ambulatory Visit: Payer: Self-pay | Admitting: Family Medicine

## 2016-03-19 ENCOUNTER — Other Ambulatory Visit: Payer: Self-pay | Admitting: Family Medicine

## 2016-03-20 ENCOUNTER — Other Ambulatory Visit: Payer: Self-pay | Admitting: Family Medicine

## 2016-04-05 ENCOUNTER — Other Ambulatory Visit: Payer: Self-pay | Admitting: Family Medicine

## 2016-04-06 ENCOUNTER — Encounter: Payer: Self-pay | Admitting: Family Medicine

## 2016-04-06 ENCOUNTER — Ambulatory Visit (INDEPENDENT_AMBULATORY_CARE_PROVIDER_SITE_OTHER): Payer: Medicare Other | Admitting: Family Medicine

## 2016-04-06 VITALS — BP 132/90 | HR 61 | Temp 98.2°F | Ht 63.5 in | Wt 166.8 lb

## 2016-04-06 DIAGNOSIS — E2839 Other primary ovarian failure: Secondary | ICD-10-CM | POA: Diagnosis not present

## 2016-04-06 DIAGNOSIS — I10 Essential (primary) hypertension: Secondary | ICD-10-CM

## 2016-04-06 DIAGNOSIS — J4520 Mild intermittent asthma, uncomplicated: Secondary | ICD-10-CM | POA: Diagnosis not present

## 2016-04-06 DIAGNOSIS — Z1231 Encounter for screening mammogram for malignant neoplasm of breast: Secondary | ICD-10-CM

## 2016-04-06 DIAGNOSIS — Z0000 Encounter for general adult medical examination without abnormal findings: Secondary | ICD-10-CM

## 2016-04-06 DIAGNOSIS — N390 Urinary tract infection, site not specified: Secondary | ICD-10-CM

## 2016-04-06 DIAGNOSIS — F320 Major depressive disorder, single episode, mild: Secondary | ICD-10-CM | POA: Diagnosis not present

## 2016-04-06 DIAGNOSIS — G4700 Insomnia, unspecified: Secondary | ICD-10-CM | POA: Diagnosis not present

## 2016-04-06 DIAGNOSIS — R82998 Other abnormal findings in urine: Secondary | ICD-10-CM

## 2016-04-06 DIAGNOSIS — Z23 Encounter for immunization: Secondary | ICD-10-CM | POA: Diagnosis not present

## 2016-04-06 LAB — POCT URINALYSIS DIPSTICK
Bilirubin, UA: NEGATIVE
Blood, UA: NEGATIVE
Glucose, UA: NEGATIVE
Ketones, UA: NEGATIVE
Nitrite, UA: NEGATIVE
Protein, UA: NEGATIVE
Spec Grav, UA: >=1.03
Urobilinogen, UA: 0.2
pH, UA: 6

## 2016-04-06 MED ORDER — AMLODIPINE BESYLATE 5 MG PO TABS: 5.0000 mg | ORAL_TABLET | Freq: Every day | ORAL | 3 refills | Status: DC

## 2016-04-06 MED ORDER — FLUTICASONE-SALMETEROL 250-50 MCG/DOSE IN AEPB: 1.0000 | INHALATION_SPRAY | Freq: Two times a day (BID) | RESPIRATORY_TRACT | 5 refills | Status: DC

## 2016-04-06 MED ORDER — BUPROPION HCL ER (XL) 300 MG PO TB24: ORAL_TABLET | ORAL | 3 refills | Status: DC

## 2016-04-06 MED ORDER — CITALOPRAM HYDROBROMIDE 10 MG PO TABS: 10.0000 mg | ORAL_TABLET | Freq: Every day | ORAL | 1 refills | Status: DC

## 2016-04-06 MED ORDER — TRAZODONE HCL 50 MG PO TABS: ORAL_TABLET | ORAL | 2 refills | Status: DC

## 2016-04-06 MED ORDER — FUROSEMIDE 20 MG PO TABS
20.0000 mg | ORAL_TABLET | Freq: Every day | ORAL | 0 refills | Status: DC
Start: 2016-04-06 — End: 2016-09-18

## 2016-04-06 NOTE — Progress Notes (Signed)
Subjective:   Samantha Davidson is a 70 y.o. female who presents for Medicare Annual (Subsequent) preventive examination.  Review of Systems:  Review of Systems  Constitutional: Negative for activity change, appetite change and fatigue.  HENT: Negative for hearing loss, congestion, tinnitus and ear discharge.  dentist q47m Eyes: Negative for visual disturbance (see optho q1y -- vision corrected to 20/20 with glasses).  Respiratory: Negative for cough, chest tightness and shortness of breath.   Cardiovascular: Negative for chest pain, palpitations and leg swelling.  Gastrointestinal: Negative for abdominal pain, diarrhea, constipation and abdominal distention.  Genitourinary: Negative for urgency, frequency, decreased urine volume and difficulty urinating.  Musculoskeletal: Negative for back pain, arthralgias and gait problem.  Skin: Negative for color change, pallor and rash.  Neurological: Negative for dizziness, light-headedness, numbness and headaches.  Hematological: Negative for adenopathy. Does not bruise/bleed easily.  Psychiatric/Behavioral: Negative for suicidal ideas, confusion, sleep disturbance, self-injury, dysphoric mood, decreased concentration and agitation.            Objective:     Vitals: BP 132/90 (BP Location: Left Arm, Patient Position: Sitting, Cuff Size: Normal)   Pulse 61   Temp 98.2 F (36.8 C) (Oral)   Ht 5' 3.5" (1.613 m)   Wt 166 lb 12.8 oz (75.7 kg)   SpO2 98%   BMI 29.08 kg/m   Body mass index is 29.08 kg/m. BP 132/90 (BP Location: Left Arm, Patient Position: Sitting, Cuff Size: Normal)   Pulse 61   Temp 98.2 F (36.8 C) (Oral)   Ht 5' 3.5" (1.613 m)   Wt 166 lb 12.8 oz (75.7 kg)   SpO2 98%   BMI 29.08 kg/m  General appearance: alert, cooperative, appears stated age and no distress Head: Normocephalic, without obvious abnormality, atraumatic Eyes: conjunctivae/corneas clear. PERRL, EOM's intact. Fundi benign. Ears: normal TM's  and external ear canals both ears Nose: Nares normal. Septum midline. Mucosa normal. No drainage or sinus tenderness. Throat: lips, mucosa, and tongue normal; teeth and gums normal Neck: no adenopathy, no carotid bruit, no JVD, supple, symmetrical, trachea midline and thyroid not enlarged, symmetric, no tenderness/mass/nodules Back: symmetric, no curvature. ROM normal. No CVA tenderness. Lungs: clear to auscultation bilaterally Breasts: normal appearance, no masses or tenderness Heart: regular rate and rhythm, S1, S2 normal, no murmur, click, rub or gallop Abdomen: soft, non-tender; bowel sounds normal; no masses,  no organomegaly Pelvic: deferred Extremities: extremities normal, atraumatic, no cyanosis or edema Pulses: 2+ and symmetric Skin: Skin color, texture, turgor normal. No rashes or lesions Lymph nodes: Cervical, supraclavicular, and axillary nodes normal. Neurologic: Alert and oriented X 3, normal strength and tone. Normal symmetric reflexes. Normal coordination and gait  Tobacco History  Smoking Status  . Former Smoker  Smokeless Tobacco  . Never Used    Comment: only when stressed     Counseling given: Not Answered   Past Medical History:  Diagnosis Date  . Allergy   . Asthma   . Depression   . GERD (gastroesophageal reflux disease)   . History of DVT of lower extremity   . Hypertension   . Lymphocytic colitis   . Rectal polyp   . Tubular adenoma of colon    Past Surgical History:  Procedure Laterality Date  . COLONOSCOPY    . FOOT SURGERY  1988   pin in both great toes  . TONSILLECTOMY AND ADENOIDECTOMY     Family History  Problem Relation Age of Onset  . Arthritis Maternal Grandmother   . Lung  cancer Mother   . Heart disease Mother   . Mental illness Mother   . Cancer Father    History  Sexual Activity  . Sexual activity: Not on file    Outpatient Encounter Prescriptions as of 04/06/2016  Medication Sig  . acetaminophen (TYLENOL) 500 MG tablet  Take 500 mg by mouth every 6 (six) hours as needed (for pain).   Marland Kitchen aspirin 81 MG EC tablet Take 81 mg by mouth daily.    . budesonide (ENTOCORT EC) 3 MG 24 hr capsule Take 3 capsules (9 mg total) by mouth daily.  Marland Kitchen buPROPion (WELLBUTRIN XL) 300 MG 24 hr tablet TAKE 1 TABLET (300 MG TOTAL) BY MOUTH EVERY MORNING  . Cholecalciferol (VITAMIN D) 2000 UNITS CAPS Take 2 capsules by mouth daily.    . citalopram (CELEXA) 10 MG tablet TAKE 1 TABLET BY MOUTH EVERY DAY  . Diclofenac Sodium 1.5 % SOLN Apply 1 application topically 2 (two) times daily as needed. For pain.  Marland Kitchen EPINEPHrine (EPIPEN) 0.3 mg/0.3 mL DEVI Inject 0.3 mLs (0.3 mg total) into the muscle once.  . fexofenadine (ALLEGRA) 180 MG tablet Take 1 tablet (180 mg total) by mouth daily.  . Fluticasone-Salmeterol (ADVAIR DISKUS) 250-50 MCG/DOSE AEPB Inhale 1 puff into the lungs 2 (two) times daily. (Patient taking differently: Inhale 1 puff into the lungs 2 (two) times daily as needed (for shortness of breath). )  . furosemide (LASIX) 20 MG tablet TAKE 1 TABLET (20 MG TOTAL) BY MOUTH DAILY.  Marland Kitchen losartan (COZAAR) 100 MG tablet TAKE 1 TABLET (100 MG TOTAL) BY MOUTH DAILY.  . Multiple Vitamin (MULTIVITAMIN PO) Take 1 tablet by mouth daily.    . TraMADol HCl 50 MG TBDP Take 1 tablet by mouth 2 (two) times daily as needed.  . traZODone (DESYREL) 50 MG tablet Take 1/4 tab (Patient taking differently: Take 12.5-25 mg by mouth at bedtime as needed for sleep. )   No facility-administered encounter medications on file as of 04/06/2016.     Activities of Daily Living In your present state of health, do you have any difficulty performing the following activities: 04/06/2016  Hearing? N  Vision? N  Difficulty concentrating or making decisions? N  Walking or climbing stairs? N  Dressing or bathing? N  Doing errands, shopping? N  Some recent data might be hidden    Patient Care Team: Ann Held, DO as PCP - General (Family Medicine)      Assessment:    cpe Exercise Activities and Dietary recommendations Current Exercise Habits: Home exercise routine, Type of exercise: walking, Time (Minutes): 30, Frequency (Times/Week): 7, Weekly Exercise (Minutes/Week): 210, Intensity: Mild, Exercise limited by: None identified  Goals    None     Fall Risk Fall Risk  04/06/2016 04/01/2015 09/21/2013  Falls in the past year? Yes Yes No  Number falls in past yr: 1 1 -  Injury with Fall? - Yes -  Risk for fall due to : - Other (Comment) -   Depression Screen PHQ 2/9 Scores 04/06/2016 04/01/2015 09/21/2013  PHQ - 2 Score 0 0 0     Cognitive Testing No flowsheet data found.  Immunization History  Administered Date(s) Administered  . Influenza Split 06/07/2012  . Influenza,inj,Quad PF,36+ Mos 09/21/2013  . Pneumococcal Polysaccharide-23 06/07/2012  . Tdap 12/23/2014   Screening Tests Health Maintenance  Topic Date Due  . Hepatitis C Screening  [birth year]  . MAMMOGRAM  09/01/2012  . COLONOSCOPY  02/22/2013  . PNA  vac Low Risk Adult (2 of 2 - PCV13) 06/07/2013  . INFLUENZA VACCINE  10/19/2016 (Originally 03/09/2016)  . TETANUS/TDAP  12/22/2024  . DEXA SCAN  Completed  . ZOSTAVAX  Completed      Plan:    During the course of the visit the patient was educated and counseled about the following appropriate screening and preventive services:   Vaccines to include Pneumoccal, Influenza, Hepatitis B, Td, Zostavax, HCV  Electrocardiogram  Cardiovascular Disease  Colorectal cancer screening  Bone density screening  Diabetes screening  Glaucoma screening  Mammography/PAP  Nutrition counseling   Patient Instructions (the written plan) was given to the patient.   1. Encounter for screening mammogram for breast cancer  - MM Digital Screening; Future  2. Estrogen deficiency  - DG Bone Density; Future  3. Insomnia   - traZODone (DESYREL) 50 MG tablet; Take 1/4 tab  Dispense: 30 tablet; Refill: 2  4. Asthma, mild  intermittent, uncomplicated   - Fluticasone-Salmeterol (ADVAIR DISKUS) 250-50 MCG/DOSE AEPB; Inhale 1 puff into the lungs 2 (two) times daily.  Dispense: 60 each; Refill: 5  5. Essential hypertension   - furosemide (LASIX) 20 MG tablet; Take 1 tablet (20 mg total) by mouth daily.  Dispense: 90 tablet; Refill: 0 - amLODipine (NORVASC) 5 MG tablet; Take 1 tablet (5 mg total) by mouth daily.  Dispense: 30 tablet; Refill: 3 - CBC with Differential/Platelet - Lipid panel - POCT urinalysis dipstick - TSH - Comprehensive metabolic panel - Microalbumin / creatinine urine ratio  6. Major depressive disorder, single episode, mild (HCC)   - citalopram (CELEXA) 10 MG tablet; Take 1 tablet (10 mg total) by mouth daily.  Dispense: 90 tablet; Refill: 1 - buPROPion (WELLBUTRIN XL) 300 MG 24 hr tablet; TAKE 1 TABLET (300 MG TOTAL) BY MOUTH EVERY MORNING  Dispense: 90 tablet; Refill: 3 - CBC with Differential/Platelet - Lipid panel - POCT urinalysis dipstick - TSH - Comprehensive metabolic panel - Microalbumin / creatinine urine ratio  7. Preventative health care    8. Routine history and physical examination of adult    9. Need for pneumococcal vaccination   - Pneumococcal conjugate vaccine 13-valent  10. Leukocytes in urine    - Urine culture  Ann Held, DO  04/06/2016

## 2016-04-06 NOTE — Progress Notes (Signed)
Pre visit review using our clinic review tool, if applicable. No additional management support is needed unless otherwise documented below in the visit note. 

## 2016-04-06 NOTE — Patient Instructions (Signed)
Preventive Care for Adults, Female A healthy lifestyle and preventive care can promote health and wellness. Preventive health guidelines for women include the following key practices.  A routine yearly physical is a good way to check with your health care provider about your health and preventive screening. It is a chance to share any concerns and updates on your health and to receive a thorough exam.  Visit your dentist for a routine exam and preventive care every 6 months. Brush your teeth twice a day and floss once a day. Good oral hygiene prevents tooth decay and gum disease.  The frequency of eye exams is based on your age, health, family medical history, use of contact lenses, and other factors. Follow your health care provider's recommendations for frequency of eye exams.  Eat a healthy diet. Foods like vegetables, fruits, whole grains, low-fat dairy products, and lean protein foods contain the nutrients you need without too many calories. Decrease your intake of foods high in solid fats, added sugars, and salt. Eat the right amount of calories for you.Get information about a proper diet from your health care provider, if necessary.  Regular physical exercise is one of the most important things you can do for your health. Most adults should get at least 150 minutes of moderate-intensity exercise (any activity that increases your heart rate and causes you to sweat) each week. In addition, most adults need muscle-strengthening exercises on 2 or more days a week.  Maintain a healthy weight. The body mass index (BMI) is a screening tool to identify possible weight problems. It provides an estimate of body fat based on height and weight. Your health care provider can find your BMI and can help you achieve or maintain a healthy weight.For adults 20 years and older:  A BMI below 18.5 is considered underweight.  A BMI of 18.5 to 24.9 is normal.  A BMI of 25 to 29.9 is considered overweight.  A  BMI of 30 and above is considered obese.  Maintain normal blood lipids and cholesterol levels by exercising and minimizing your intake of saturated fat. Eat a balanced diet with plenty of fruit and vegetables. Blood tests for lipids and cholesterol should begin at age 45 and be repeated every 5 years. If your lipid or cholesterol levels are high, you are over 50, or you are at high risk for heart disease, you may need your cholesterol levels checked more frequently.Ongoing high lipid and cholesterol levels should be treated with medicines if diet and exercise are not working.  If you smoke, find out from your health care provider how to quit. If you do not use tobacco, do not start.  Lung cancer screening is recommended for adults aged 45-80 years who are at high risk for developing lung cancer because of a history of smoking. A yearly low-dose CT scan of the lungs is recommended for people who have at least a 30-pack-year history of smoking and are a current smoker or have quit within the past 15 years. A pack year of smoking is smoking an average of 1 pack of cigarettes a day for 1 year (for example: 1 pack a day for 30 years or 2 packs a day for 15 years). Yearly screening should continue until the smoker has stopped smoking for at least 15 years. Yearly screening should be stopped for people who develop a health problem that would prevent them from having lung cancer treatment.  If you are pregnant, do not drink alcohol. If you are  breastfeeding, be very cautious about drinking alcohol. If you are not pregnant and choose to drink alcohol, do not have more than 1 drink per day. One drink is considered to be 12 ounces (355 mL) of beer, 5 ounces (148 mL) of wine, or 1.5 ounces (44 mL) of liquor.  Avoid use of street drugs. Do not share needles with anyone. Ask for help if you need support or instructions about stopping the use of drugs.  High blood pressure causes heart disease and increases the risk  of stroke. Your blood pressure should be checked at least every 1 to 2 years. Ongoing high blood pressure should be treated with medicines if weight loss and exercise do not work.  If you are 55-79 years old, ask your health care provider if you should take aspirin to prevent strokes.  Diabetes screening is done by taking a blood sample to check your blood glucose level after you have not eaten for a certain period of time (fasting). If you are not overweight and you do not have risk factors for diabetes, you should be screened once every 3 years starting at age 45. If you are overweight or obese and you are 40-70 years of age, you should be screened for diabetes every year as part of your cardiovascular risk assessment.  Breast cancer screening is essential preventive care for women. You should practice "breast self-awareness." This means understanding the normal appearance and feel of your breasts and may include breast self-examination. Any changes detected, no matter how small, should be reported to a health care provider. Women in their 20s and 30s should have a clinical breast exam (CBE) by a health care provider as part of a regular health exam every 1 to 3 years. After age 40, women should have a CBE every year. Starting at age 40, women should consider having a mammogram (breast X-ray test) every year. Women who have a family history of breast cancer should talk to their health care provider about genetic screening. Women at a high risk of breast cancer should talk to their health care providers about having an MRI and a mammogram every year.  Breast cancer gene (BRCA)-related cancer risk assessment is recommended for women who have family members with BRCA-related cancers. BRCA-related cancers include breast, ovarian, tubal, and peritoneal cancers. Having family members with these cancers may be associated with an increased risk for harmful changes (mutations) in the breast cancer genes BRCA1 and  BRCA2. Results of the assessment will determine the need for genetic counseling and BRCA1 and BRCA2 testing.  Your health care provider may recommend that you be screened regularly for cancer of the pelvic organs (ovaries, uterus, and vagina). This screening involves a pelvic examination, including checking for microscopic changes to the surface of your cervix (Pap test). You may be encouraged to have this screening done every 3 years, beginning at age 21.  For women ages 30-65, health care providers may recommend pelvic exams and Pap testing every 3 years, or they may recommend the Pap and pelvic exam, combined with testing for human papilloma virus (HPV), every 5 years. Some types of HPV increase your risk of cervical cancer. Testing for HPV may also be done on women of any age with unclear Pap test results.  Other health care providers may not recommend any screening for nonpregnant women who are considered low risk for pelvic cancer and who do not have symptoms. Ask your health care provider if a screening pelvic exam is right for   you.  If you have had past treatment for cervical cancer or a condition that could lead to cancer, you need Pap tests and screening for cancer for at least 20 years after your treatment. If Pap tests have been discontinued, your risk factors (such as having a new sexual partner) need to be reassessed to determine if screening should resume. Some women have medical problems that increase the chance of getting cervical cancer. In these cases, your health care provider may recommend more frequent screening and Pap tests.  Colorectal cancer can be detected and often prevented. Most routine colorectal cancer screening begins at the age of 50 years and continues through age 75 years. However, your health care provider may recommend screening at an earlier age if you have risk factors for colon cancer. On a yearly basis, your health care provider may provide home test kits to check  for hidden blood in the stool. Use of a small camera at the end of a tube, to directly examine the colon (sigmoidoscopy or colonoscopy), can detect the earliest forms of colorectal cancer. Talk to your health care provider about this at age 50, when routine screening begins. Direct exam of the colon should be repeated every 5-10 years through age 75 years, unless early forms of precancerous polyps or small growths are found.  People who are at an increased risk for hepatitis B should be screened for this virus. You are considered at high risk for hepatitis B if:  You were born in a country where hepatitis B occurs often. Talk with your health care provider about which countries are considered high risk.  Your parents were born in a high-risk country and you have not received a shot to protect against hepatitis B (hepatitis B vaccine).  You have HIV or AIDS.  You use needles to inject street drugs.  You live with, or have sex with, someone who has hepatitis B.  You get hemodialysis treatment.  You take certain medicines for conditions like cancer, organ transplantation, and autoimmune conditions.  Hepatitis C blood testing is recommended for all people born from 1945 through 1965 and any individual with known risks for hepatitis C.  Practice safe sex. Use condoms and avoid high-risk sexual practices to reduce the spread of sexually transmitted infections (STIs). STIs include gonorrhea, chlamydia, syphilis, trichomonas, herpes, HPV, and human immunodeficiency virus (HIV). Herpes, HIV, and HPV are viral illnesses that have no cure. They can result in disability, cancer, and death.  You should be screened for sexually transmitted illnesses (STIs) including gonorrhea and chlamydia if:  You are sexually active and are younger than 24 years.  You are older than 24 years and your health care provider tells you that you are at risk for this type of infection.  Your sexual activity has changed  since you were last screened and you are at an increased risk for chlamydia or gonorrhea. Ask your health care provider if you are at risk.  If you are at risk of being infected with HIV, it is recommended that you take a prescription medicine daily to prevent HIV infection. This is called preexposure prophylaxis (PrEP). You are considered at risk if:  You are sexually active and do not regularly use condoms or know the HIV status of your partner(s).  You take drugs by injection.  You are sexually active with a partner who has HIV.  Talk with your health care provider about whether you are at high risk of being infected with HIV. If   you choose to begin PrEP, you should first be tested for HIV. You should then be tested every 3 months for as long as you are taking PrEP.  Osteoporosis is a disease in which the bones lose minerals and strength with aging. This can result in serious bone fractures or breaks. The risk of osteoporosis can be identified using a bone density scan. Women ages 67 years and over and women at risk for fractures or osteoporosis should discuss screening with their health care providers. Ask your health care provider whether you should take a calcium supplement or vitamin D to reduce the rate of osteoporosis.  Menopause can be associated with physical symptoms and risks. Hormone replacement therapy is available to decrease symptoms and risks. You should talk to your health care provider about whether hormone replacement therapy is right for you.  Use sunscreen. Apply sunscreen liberally and repeatedly throughout the day. You should seek shade when your shadow is shorter than you. Protect yourself by wearing long sleeves, pants, a wide-brimmed hat, and sunglasses year round, whenever you are outdoors.  Once a month, do a whole body skin exam, using a mirror to look at the skin on your back. Tell your health care provider of new moles, moles that have irregular borders, moles that  are larger than a pencil eraser, or moles that have changed in shape or color.  Stay current with required vaccines (immunizations).  Influenza vaccine. All adults should be immunized every year.  Tetanus, diphtheria, and acellular pertussis (Td, Tdap) vaccine. Pregnant women should receive 1 dose of Tdap vaccine during each pregnancy. The dose should be obtained regardless of the length of time since the last dose. Immunization is preferred during the 27th-36th week of gestation. An adult who has not previously received Tdap or who does not know her vaccine status should receive 1 dose of Tdap. This initial dose should be followed by tetanus and diphtheria toxoids (Td) booster doses every 10 years. Adults with an unknown or incomplete history of completing a 3-dose immunization series with Td-containing vaccines should begin or complete a primary immunization series including a Tdap dose. Adults should receive a Td booster every 10 years.  Varicella vaccine. An adult without evidence of immunity to varicella should receive 2 doses or a second dose if she has previously received 1 dose. Pregnant females who do not have evidence of immunity should receive the first dose after pregnancy. This first dose should be obtained before leaving the health care facility. The second dose should be obtained 4-8 weeks after the first dose.  Human papillomavirus (HPV) vaccine. Females aged 13-26 years who have not received the vaccine previously should obtain the 3-dose series. The vaccine is not recommended for use in pregnant females. However, pregnancy testing is not needed before receiving a dose. If a female is found to be pregnant after receiving a dose, no treatment is needed. In that case, the remaining doses should be delayed until after the pregnancy. Immunization is recommended for any person with an immunocompromised condition through the age of 61 years if she did not get any or all doses earlier. During the  3-dose series, the second dose should be obtained 4-8 weeks after the first dose. The third dose should be obtained 24 weeks after the first dose and 16 weeks after the second dose.  Zoster vaccine. One dose is recommended for adults aged 30 years or older unless certain conditions are present.  Measles, mumps, and rubella (MMR) vaccine. Adults born  before 1957 generally are considered immune to measles and mumps. Adults born in 1957 or later should have 1 or more doses of MMR vaccine unless there is a contraindication to the vaccine or there is laboratory evidence of immunity to each of the three diseases. A routine second dose of MMR vaccine should be obtained at least 28 days after the first dose for students attending postsecondary schools, health care workers, or international travelers. People who received inactivated measles vaccine or an unknown type of measles vaccine during 1963-1967 should receive 2 doses of MMR vaccine. People who received inactivated mumps vaccine or an unknown type of mumps vaccine before 1979 and are at high risk for mumps infection should consider immunization with 2 doses of MMR vaccine. For females of childbearing age, rubella immunity should be determined. If there is no evidence of immunity, females who are not pregnant should be vaccinated. If there is no evidence of immunity, females who are pregnant should delay immunization until after pregnancy. Unvaccinated health care workers born before 1957 who lack laboratory evidence of measles, mumps, or rubella immunity or laboratory confirmation of disease should consider measles and mumps immunization with 2 doses of MMR vaccine or rubella immunization with 1 dose of MMR vaccine.  Pneumococcal 13-valent conjugate (PCV13) vaccine. When indicated, a person who is uncertain of his immunization history and has no record of immunization should receive the PCV13 vaccine. All adults 65 years of age and older should receive this  vaccine. An adult aged 19 years or older who has certain medical conditions and has not been previously immunized should receive 1 dose of PCV13 vaccine. This PCV13 should be followed with a dose of pneumococcal polysaccharide (PPSV23) vaccine. Adults who are at high risk for pneumococcal disease should obtain the PPSV23 vaccine at least 8 weeks after the dose of PCV13 vaccine. Adults older than 69 years of age who have normal immune system function should obtain the PPSV23 vaccine dose at least 1 year after the dose of PCV13 vaccine.  Pneumococcal polysaccharide (PPSV23) vaccine. When PCV13 is also indicated, PCV13 should be obtained first. All adults aged 65 years and older should be immunized. An adult younger than age 65 years who has certain medical conditions should be immunized. Any person who resides in a nursing home or long-term care facility should be immunized. An adult smoker should be immunized. People with an immunocompromised condition and certain other conditions should receive both PCV13 and PPSV23 vaccines. People with human immunodeficiency virus (HIV) infection should be immunized as soon as possible after diagnosis. Immunization during chemotherapy or radiation therapy should be avoided. Routine use of PPSV23 vaccine is not recommended for American Indians, [place] Natives, or people younger than 65 years unless there are medical conditions that require PPSV23 vaccine. When indicated, people who have unknown immunization and have no record of immunization should receive PPSV23 vaccine. One-time revaccination 5 years after the first dose of PPSV23 is recommended for people aged 19-64 years who have chronic kidney failure, nephrotic syndrome, asplenia, or immunocompromised conditions. People who received 1-2 doses of PPSV23 before age 65 years should receive another dose of PPSV23 vaccine at age 65 years or later if at least 5 years have passed since the previous dose. Doses of PPSV23 are not  needed for people immunized with PPSV23 at or after age 65 years.  Meningococcal vaccine. Adults with asplenia or persistent complement component deficiencies should receive 2 doses of quadrivalent meningococcal conjugate (MenACWY-D) vaccine. The doses should be obtained   at least 2 months apart. Microbiologists working with certain meningococcal bacteria, [place] recruits, people at risk during an outbreak, and people who travel to or live in countries with a high rate of meningitis should be immunized. A first-year college student up through age 34 years who is living in a residence hall should receive a dose if she did not receive a dose on or after her 16th birthday. Adults who have certain high-risk conditions should receive one or more doses of vaccine.  Hepatitis A vaccine. Adults who wish to be protected from this disease, have certain high-risk conditions, work with hepatitis A-infected animals, work in hepatitis A research labs, or travel to or work in countries with a high rate of hepatitis A should be immunized. Adults who were previously unvaccinated and who anticipate close contact with an international adoptee during the first 60 days after arrival in the [place] from a country with a high rate of hepatitis A should be immunized.  Hepatitis B vaccine. Adults who wish to be protected from this disease, have certain high-risk conditions, may be exposed to blood or other infectious body fluids, are household contacts or sex partners of hepatitis B positive people, are clients or workers in certain care facilities, or travel to or work in countries with a high rate of hepatitis B should be immunized.  Haemophilus influenzae type b (Hib) vaccine. A previously unvaccinated person with asplenia or sickle cell disease or having a scheduled splenectomy should receive 1 dose of Hib vaccine. Regardless of previous immunization, a recipient of a hematopoietic stem cell transplant should receive a  3-dose series 6-12 months after her successful transplant. Hib vaccine is not recommended for adults with HIV infection. Preventive Services / Frequency Ages 35 to 4 years  Blood pressure check.** / Every 3-5 years.  Lipid and cholesterol check.** / Every 5 years beginning at age 60.  Clinical breast exam.** / Every 3 years for women in their 71s and 10s.  BRCA-related cancer risk assessment.** / For women who have family members with a BRCA-related cancer (breast, ovarian, tubal, or peritoneal cancers).  Pap test.** / Every 2 years from ages 76 through 26. Every 3 years starting at age 61 through age 76 or 93 with a history of 3 consecutive normal Pap tests.  HPV screening.** / Every 3 years from ages 37 through ages 60 to 51 with a history of 3 consecutive normal Pap tests.  Hepatitis C blood test.** / For any individual with known risks for hepatitis C.  Skin self-exam. / Monthly.  Influenza vaccine. / Every year.  Tetanus, diphtheria, and acellular pertussis (Tdap, Td) vaccine.** / Consult your health care provider. Pregnant women should receive 1 dose of Tdap vaccine during each pregnancy. 1 dose of Td every 10 years.  Varicella vaccine.** / Consult your health care provider. Pregnant females who do not have evidence of immunity should receive the first dose after pregnancy.  HPV vaccine. / 3 doses over 6 months, if 93 and younger. The vaccine is not recommended for use in pregnant females. However, pregnancy testing is not needed before receiving a dose.  Measles, mumps, rubella (MMR) vaccine.** / You need at least 1 dose of MMR if you were born in 1957 or later. You may also need a 2nd dose. For females of childbearing age, rubella immunity should be determined. If there is no evidence of immunity, females who are not pregnant should be vaccinated. If there is no evidence of immunity, females who are  pregnant should delay immunization until after pregnancy.  Pneumococcal  13-valent conjugate (PCV13) vaccine.** / Consult your health care provider.  Pneumococcal polysaccharide (PPSV23) vaccine.** / 1 to 2 doses if you smoke cigarettes or if you have certain conditions.  Meningococcal vaccine.** / 1 dose if you are age 68 to 8 years and a Market researcher living in a residence hall, or have one of several medical conditions, you need to get vaccinated against meningococcal disease. You may also need additional booster doses.  Hepatitis A vaccine.** / Consult your health care provider.  Hepatitis B vaccine.** / Consult your health care provider.  Haemophilus influenzae type b (Hib) vaccine.** / Consult your health care provider. Ages 7 to 53 years  Blood pressure check.** / Every year.  Lipid and cholesterol check.** / Every 5 years beginning at age 25 years.  Lung cancer screening. / Every year if you are aged 11-80 years and have a 30-pack-year history of smoking and currently smoke or have quit within the past 15 years. Yearly screening is stopped once you have quit smoking for at least 15 years or develop a health problem that would prevent you from having lung cancer treatment.  Clinical breast exam.** / Every year after age 48 years.  BRCA-related cancer risk assessment.** / For women who have family members with a BRCA-related cancer (breast, ovarian, tubal, or peritoneal cancers).  Mammogram.** / Every year beginning at age 41 years and continuing for as long as you are in good health. Consult with your health care provider.  Pap test.** / Every 3 years starting at age 65 years through age 37 or 70 years with a history of 3 consecutive normal Pap tests.  HPV screening.** / Every 3 years from ages 72 years through ages 60 to 40 years with a history of 3 consecutive normal Pap tests.  Fecal occult blood test (FOBT) of stool. / Every year beginning at age 21 years and continuing until age 5 years. You may not need to do this test if you get  a colonoscopy every 10 years.  Flexible sigmoidoscopy or colonoscopy.** / Every 5 years for a flexible sigmoidoscopy or every 10 years for a colonoscopy beginning at age 35 years and continuing until age 48 years.  Hepatitis C blood test.** / For all people born from [birth year] through 1965 and any individual with known risks for hepatitis C.  Skin self-exam. / Monthly.  Influenza vaccine. / Every year.  Tetanus, diphtheria, and acellular pertussis (Tdap/Td) vaccine.** / Consult your health care provider. Pregnant women should receive 1 dose of Tdap vaccine during each pregnancy. 1 dose of Td every 10 years.  Varicella vaccine.** / Consult your health care provider. Pregnant females who do not have evidence of immunity should receive the first dose after pregnancy.  Zoster vaccine.** / 1 dose for adults aged 30 years or older.  Measles, mumps, rubella (MMR) vaccine.** / You need at least 1 dose of MMR if you were born in 1957 or later. You may also need a second dose. For females of childbearing age, rubella immunity should be determined. If there is no evidence of immunity, females who are not pregnant should be vaccinated. If there is no evidence of immunity, females who are pregnant should delay immunization until after pregnancy.  Pneumococcal 13-valent conjugate (PCV13) vaccine.** / Consult your health care provider.  Pneumococcal polysaccharide (PPSV23) vaccine.** / 1 to 2 doses if you smoke cigarettes or if you have certain conditions.  Meningococcal vaccine.** /  Consult your health care provider.  Hepatitis A vaccine.** / Consult your health care provider.  Hepatitis B vaccine.** / Consult your health care provider.  Haemophilus influenzae type b (Hib) vaccine.** / Consult your health care provider. Ages 64 years and over  Blood pressure check.** / Every year.  Lipid and cholesterol check.** / Every 5 years beginning at age 23 years.  Lung cancer screening. / Every year if you  are aged 16-80 years and have a 30-pack-year history of smoking and currently smoke or have quit within the past 15 years. Yearly screening is stopped once you have quit smoking for at least 15 years or develop a health problem that would prevent you from having lung cancer treatment.  Clinical breast exam.** / Every year after age 74 years.  BRCA-related cancer risk assessment.** / For women who have family members with a BRCA-related cancer (breast, ovarian, tubal, or peritoneal cancers).  Mammogram.** / Every year beginning at age 44 years and continuing for as long as you are in good health. Consult with your health care provider.  Pap test.** / Every 3 years starting at age 58 years through age 22 or 39 years with 3 consecutive normal Pap tests. Testing can be stopped between 65 and 70 years with 3 consecutive normal Pap tests and no abnormal Pap or HPV tests in the past 10 years.  HPV screening.** / Every 3 years from ages 64 years through ages 70 or 61 years with a history of 3 consecutive normal Pap tests. Testing can be stopped between 65 and 70 years with 3 consecutive normal Pap tests and no abnormal Pap or HPV tests in the past 10 years.  Fecal occult blood test (FOBT) of stool. / Every year beginning at age 40 years and continuing until age 27 years. You may not need to do this test if you get a colonoscopy every 10 years.  Flexible sigmoidoscopy or colonoscopy.** / Every 5 years for a flexible sigmoidoscopy or every 10 years for a colonoscopy beginning at age 7 years and continuing until age 32 years.  Hepatitis C blood test.** / For all people born from [birth year] through 1965 and any individual with known risks for hepatitis C.  Osteoporosis screening.** / A one-time screening for women ages 30 years and over and women at risk for fractures or osteoporosis.  Skin self-exam. / Monthly.  Influenza vaccine. / Every year.  Tetanus, diphtheria, and acellular pertussis (Tdap/Td)  vaccine.** / 1 dose of Td every 10 years.  Varicella vaccine.** / Consult your health care provider.  Zoster vaccine.** / 1 dose for adults aged 35 years or older.  Pneumococcal 13-valent conjugate (PCV13) vaccine.** / Consult your health care provider.  Pneumococcal polysaccharide (PPSV23) vaccine.** / 1 dose for all adults aged 46 years and older.  Meningococcal vaccine.** / Consult your health care provider.  Hepatitis A vaccine.** / Consult your health care provider.  Hepatitis B vaccine.** / Consult your health care provider.  Haemophilus influenzae type b (Hib) vaccine.** / Consult your health care provider. ** Family history and personal history of risk and conditions may change your health care provider's recommendations.   This information is not intended to replace advice given to you by your health care provider. Make sure you discuss any questions you have with your health care provider.   Document Released: 09/21/2001 Document Revised: 08/16/2014 Document Reviewed: 12/21/2010 Elsevier Interactive Patient Education [employer].

## 2016-04-07 LAB — LIPID PANEL
Cholesterol: 276 mg/dL — ABNORMAL HIGH (ref 0–200)
HDL: 113.1 mg/dL (ref >39.00)
LDL Cholesterol: 127 mg/dL — ABNORMAL HIGH (ref 0–99)
NonHDL: 163.13
Total CHOL/HDL Ratio: 2
Triglycerides: 182 mg/dL — ABNORMAL HIGH (ref 0.0–149.0)
VLDL: 36.4 mg/dL (ref 0.0–40.0)

## 2016-04-07 LAB — COMPREHENSIVE METABOLIC PANEL
ALT: 14 U/L (ref 0–35)
AST: 18 U/L (ref 0–37)
Albumin: 4.3 g/dL (ref 3.5–5.2)
Alkaline Phosphatase: 54 U/L (ref 39–117)
BUN: 17 mg/dL (ref 6–23)
CO2: 30 mEq/L (ref 19–32)
Calcium: 9.2 mg/dL (ref 8.4–10.5)
Chloride: 103 mEq/L (ref 96–112)
Creatinine, Ser: 0.91 mg/dL (ref 0.40–1.20)
GFR: 65.14 mL/min (ref >60.00)
Glucose, Bld: 86 mg/dL (ref 70–99)
Potassium: 4.3 mEq/L (ref 3.5–5.1)
Sodium: 138 mEq/L (ref 135–145)
Total Bilirubin: 0.3 mg/dL (ref 0.2–1.2)
Total Protein: 6.6 g/dL (ref 6.0–8.3)

## 2016-04-07 LAB — CBC WITH DIFFERENTIAL/PLATELET
Basophils Absolute: 0 10*3/uL (ref 0.0–0.1)
Basophils Relative: 0.6 % (ref 0.0–3.0)
Eosinophils Absolute: 0.2 10*3/uL (ref 0.0–0.7)
Eosinophils Relative: 2 % (ref 0.0–5.0)
HCT: 38.6 % (ref 36.0–46.0)
Hemoglobin: 13.1 g/dL (ref 12.0–15.0)
Lymphocytes Relative: 24.3 % (ref 12.0–46.0)
Lymphs Abs: 1.9 10*3/uL (ref 0.7–4.0)
MCHC: 34 g/dL (ref 30.0–36.0)
MCV: 96.9 fl (ref 78.0–100.0)
Monocytes Absolute: 0.7 10*3/uL (ref 0.1–1.0)
Monocytes Relative: 9.4 % (ref 3.0–12.0)
Neutro Abs: 4.9 10*3/uL (ref 1.4–7.7)
Neutrophils Relative %: 63.7 % (ref 43.0–77.0)
Platelets: 246 10*3/uL (ref 150.0–400.0)
RBC: 3.98 Mil/uL (ref 3.87–5.11)
RDW: 13.4 % (ref 11.5–15.5)
WBC: 7.6 10*3/uL (ref 4.0–10.5)

## 2016-04-07 LAB — MICROALBUMIN / CREATININE URINE RATIO
Creatinine,U: 90.1 mg/dL
Microalb Creat Ratio: 0.8 mg/g (ref 0.0–30.0)
Microalb, Ur: <0.7 mg/dL (ref 0.0–1.9)

## 2016-04-07 LAB — URINE CULTURE: Organism ID, Bacteria: NO GROWTH

## 2016-04-07 LAB — TSH: TSH: 1.94 u[IU]/mL (ref 0.35–4.50)

## 2016-04-29 ENCOUNTER — Ambulatory Visit (HOSPITAL_BASED_OUTPATIENT_CLINIC_OR_DEPARTMENT_OTHER)
Admission: RE | Admit: 2016-04-29 | Discharge: 2016-04-29 | Disposition: A | Discharge status: Home / Self Care | Payer: Medicare Other | Source: Ambulatory Visit | Attending: Family Medicine | Admitting: Family Medicine

## 2016-04-29 DIAGNOSIS — M810 Age-related osteoporosis without current pathological fracture: Secondary | ICD-10-CM | POA: Insufficient documentation

## 2016-04-29 DIAGNOSIS — Z1231 Encounter for screening mammogram for malignant neoplasm of breast: Secondary | ICD-10-CM

## 2016-04-29 DIAGNOSIS — Z13820 Encounter for screening for osteoporosis: Secondary | ICD-10-CM | POA: Diagnosis not present

## 2016-04-29 DIAGNOSIS — E2839 Other primary ovarian failure: Secondary | ICD-10-CM

## 2016-05-03 ENCOUNTER — Encounter: Payer: Self-pay | Admitting: Family Medicine

## 2016-05-03 MED ORDER — ALENDRONATE SODIUM 70 MG PO TABS: 70.0000 mg | ORAL_TABLET | ORAL | 11 refills | Status: DC

## 2016-05-09 IMAGING — CR DG CHEST 2V
2 series · 2 of 2 positions shown · non-contrast
Comparison: 01/10/2006

CLINICAL DATA: Cough and congestion

EXAM:
CHEST  2 VIEW

[w chest pa]
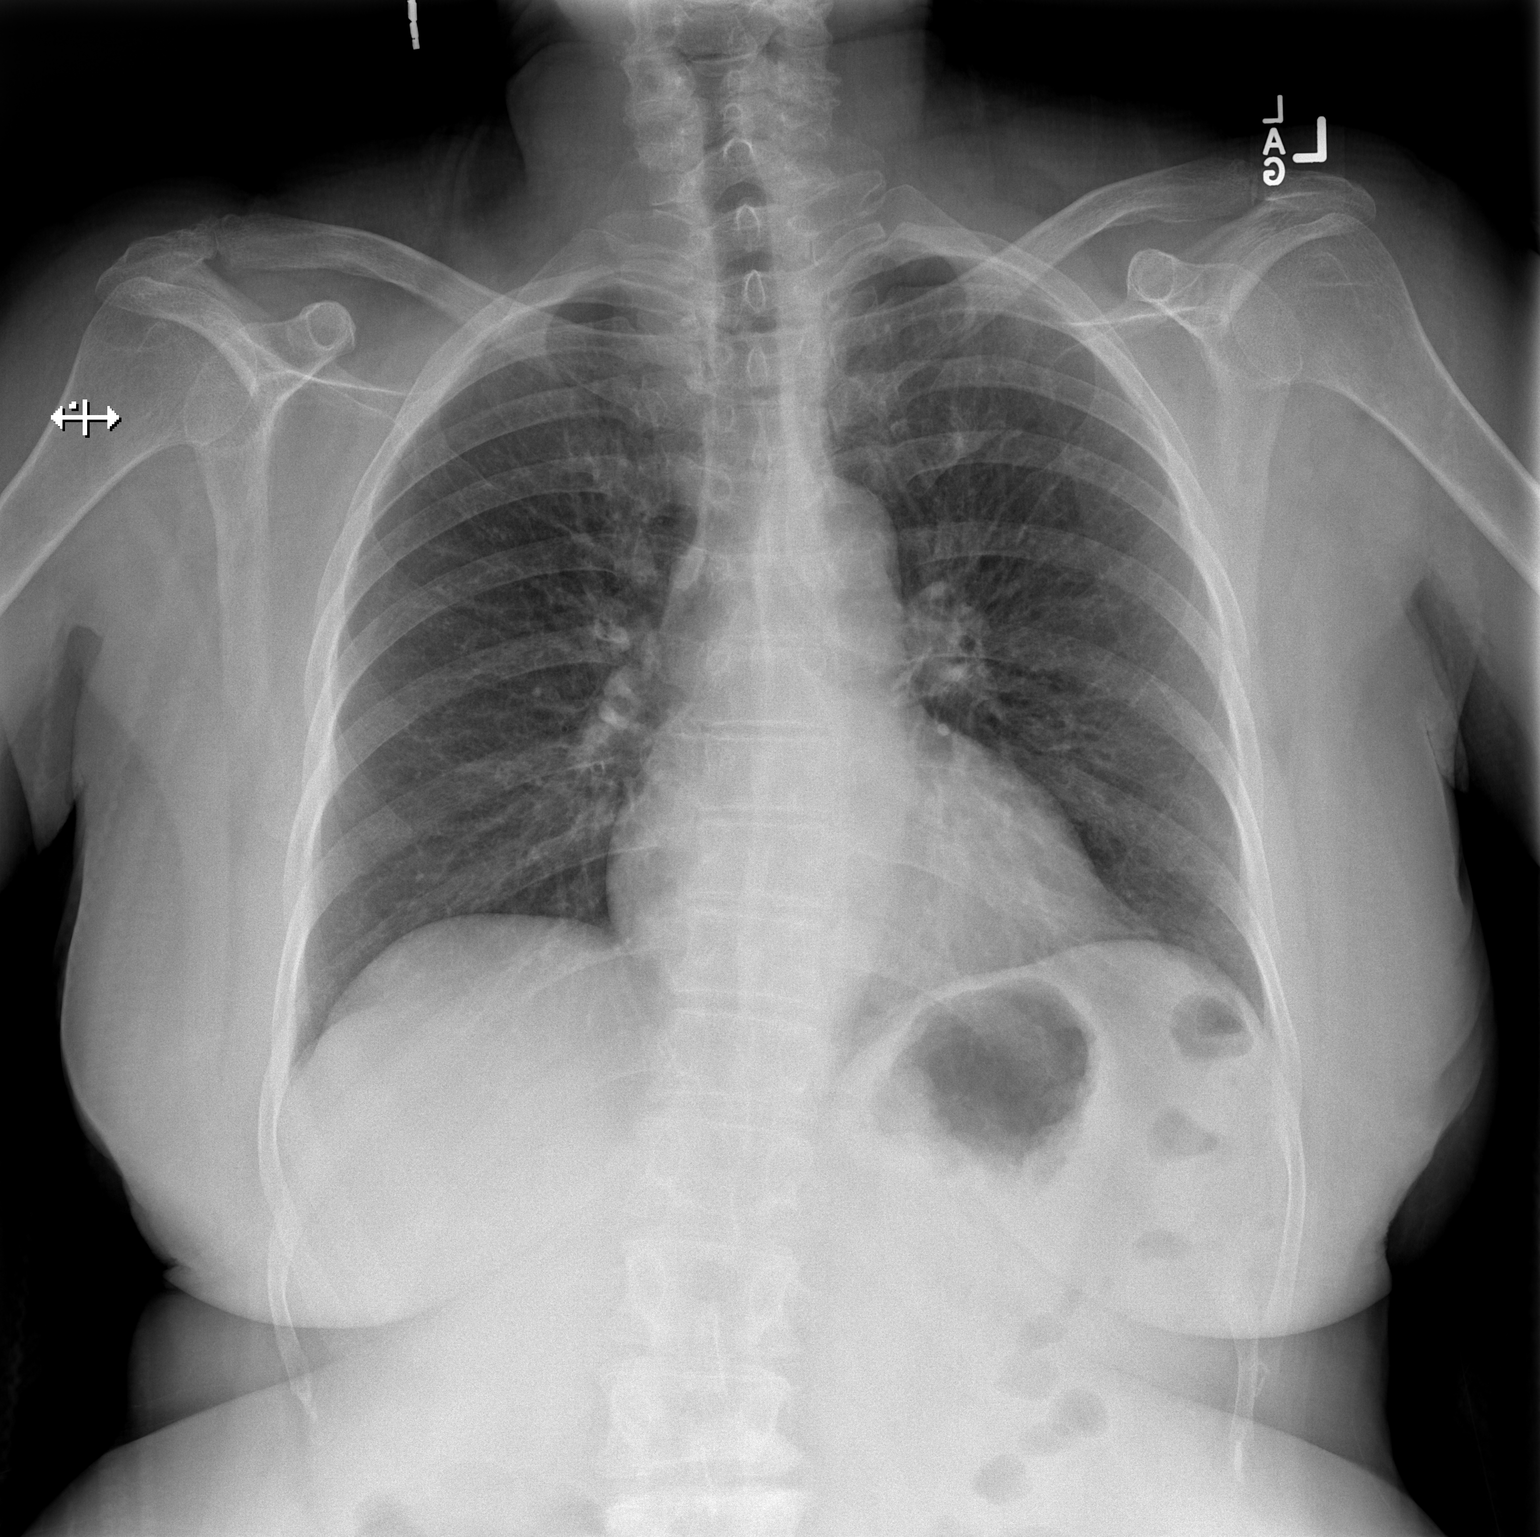

[w chest lat]
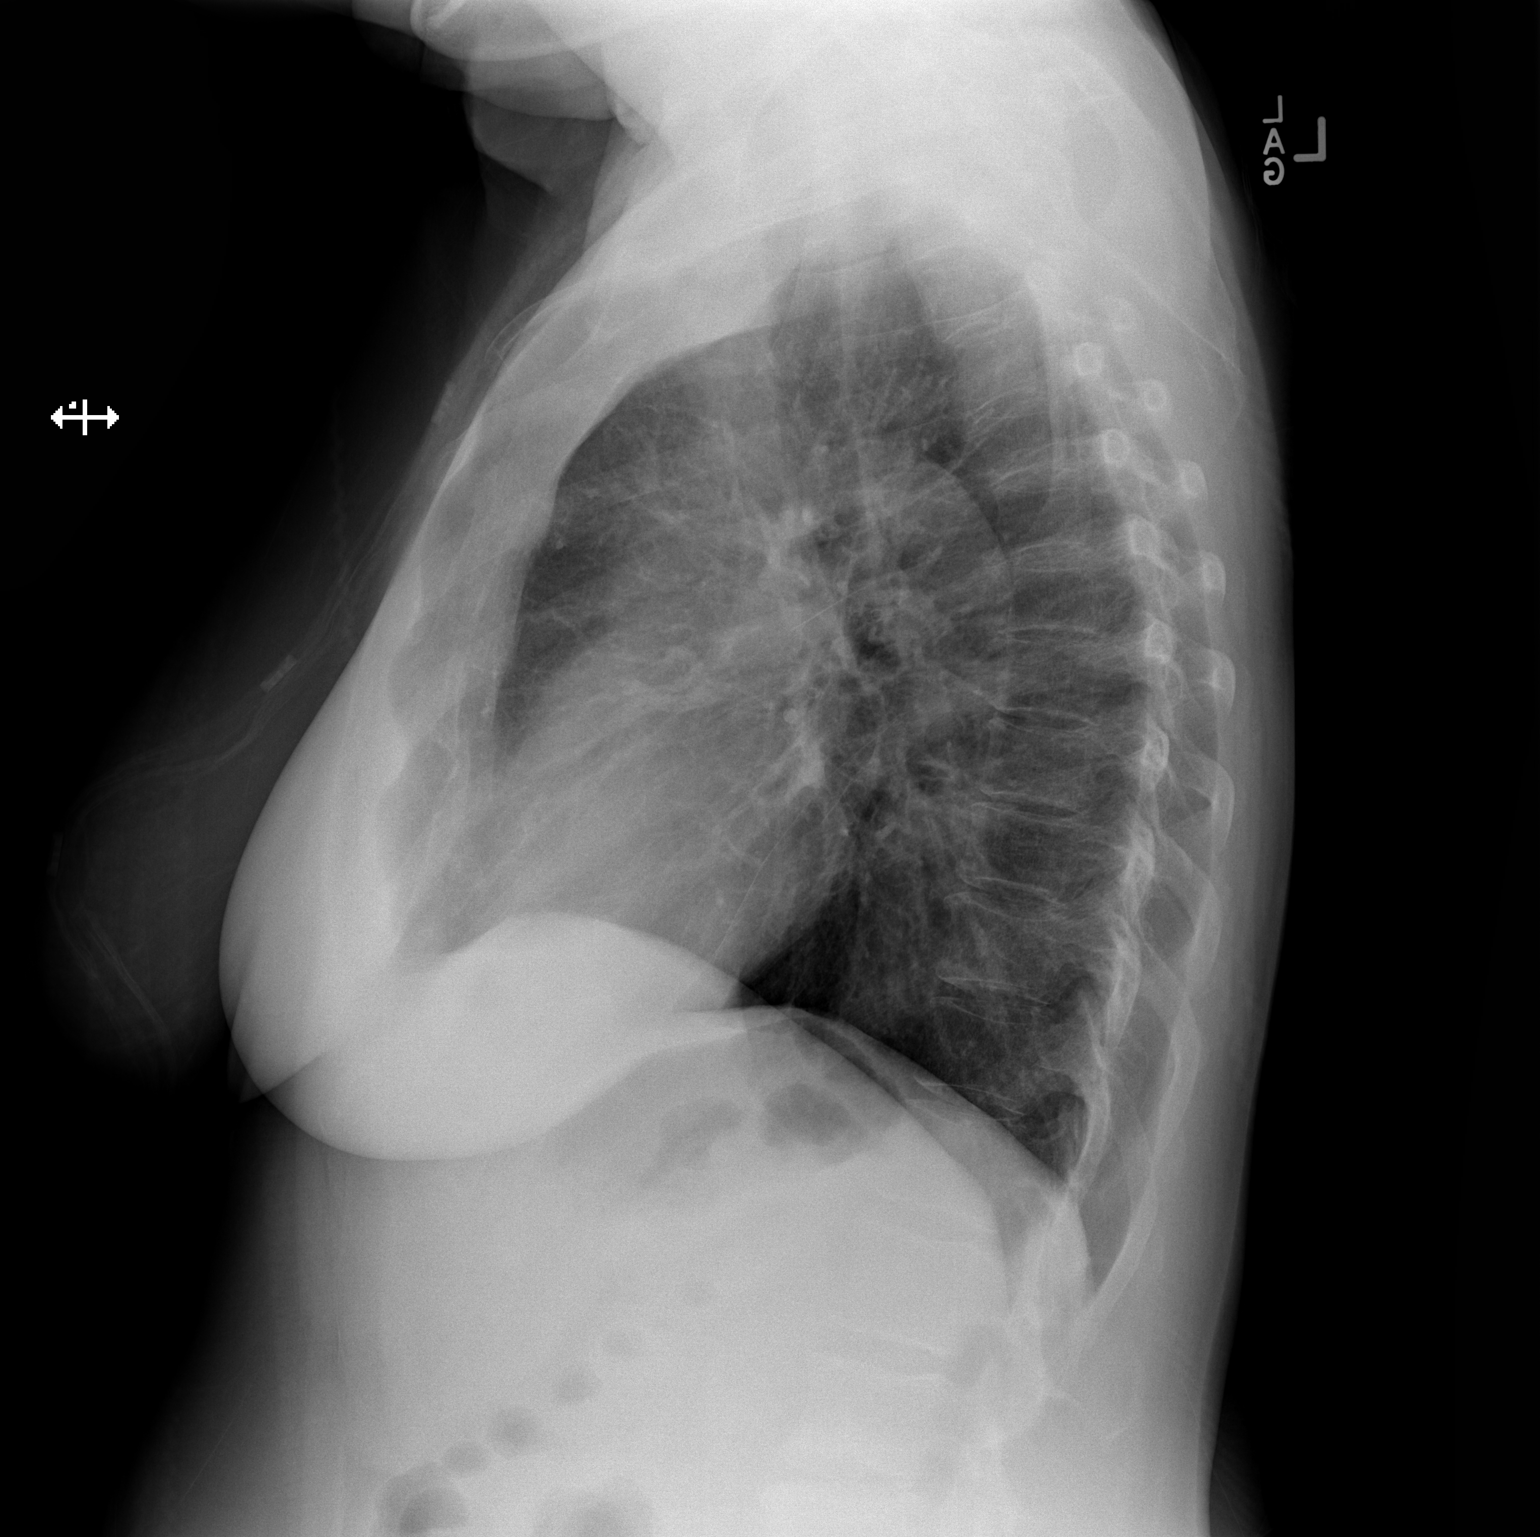

[2 of 2 positions shown; findings below may reference images not displayed]

FINDINGS: Normal heart size and mediastinal contours (prominent appearance of
the left hilum is stable since 7111). No acute infiltrate or edema.
No effusion or pneumothorax. No acute osseous findings.
IMPRESSION: No active cardiopulmonary disease.

## 2016-05-11 ENCOUNTER — Other Ambulatory Visit: Payer: Self-pay

## 2016-05-11 DIAGNOSIS — I10 Essential (primary) hypertension: Secondary | ICD-10-CM

## 2016-05-11 MED ORDER — AMLODIPINE BESYLATE 5 MG PO TABS: 5.0000 mg | ORAL_TABLET | Freq: Every day | ORAL | 1 refills | Status: DC

## 2016-07-28 DIAGNOSIS — H52223 Regular astigmatism, bilateral: Secondary | ICD-10-CM | POA: Diagnosis not present

## 2016-07-28 DIAGNOSIS — H524 Presbyopia: Secondary | ICD-10-CM | POA: Diagnosis not present

## 2016-07-28 DIAGNOSIS — H2513 Age-related nuclear cataract, bilateral: Secondary | ICD-10-CM | POA: Diagnosis not present

## 2016-07-28 DIAGNOSIS — H5213 Myopia, bilateral: Secondary | ICD-10-CM | POA: Diagnosis not present

## 2016-09-18 ENCOUNTER — Other Ambulatory Visit: Payer: Self-pay | Admitting: Family Medicine

## 2016-09-18 DIAGNOSIS — I10 Essential (primary) hypertension: Secondary | ICD-10-CM

## 2016-09-20 NOTE — Telephone Encounter (Signed)
Pt. last seen 04/06/16 when should pt follow up. Please advise

## 2016-09-20 NOTE — Telephone Encounter (Signed)
1year

## 2016-09-27 ENCOUNTER — Telehealth: Payer: Self-pay | Admitting: Family Medicine

## 2016-09-27 NOTE — Telephone Encounter (Signed)
lvm advising patient to schedule AWV °

## 2016-10-09 ENCOUNTER — Other Ambulatory Visit: Payer: Self-pay | Admitting: Family Medicine

## 2016-10-09 DIAGNOSIS — I10 Essential (primary) hypertension: Secondary | ICD-10-CM

## 2016-10-18 ENCOUNTER — Other Ambulatory Visit: Payer: Self-pay | Admitting: Family Medicine

## 2016-10-18 DIAGNOSIS — F320 Major depressive disorder, single episode, mild: Secondary | ICD-10-CM

## 2016-10-29 DIAGNOSIS — S60572A Other superficial bite of hand of left hand, initial encounter: Secondary | ICD-10-CM | POA: Diagnosis not present

## 2016-10-29 DIAGNOSIS — W540XXA Bitten by dog, initial encounter: Secondary | ICD-10-CM | POA: Diagnosis not present

## 2016-10-29 DIAGNOSIS — S60871A Other superficial bite of right wrist, initial encounter: Secondary | ICD-10-CM | POA: Diagnosis not present

## 2016-11-09 ENCOUNTER — Other Ambulatory Visit: Payer: Self-pay | Admitting: Family Medicine

## 2016-11-09 DIAGNOSIS — F320 Major depressive disorder, single episode, mild: Secondary | ICD-10-CM

## 2016-11-10 ENCOUNTER — Other Ambulatory Visit: Payer: Self-pay | Admitting: Family Medicine

## 2016-11-10 DIAGNOSIS — I10 Essential (primary) hypertension: Secondary | ICD-10-CM

## 2016-11-11 ENCOUNTER — Other Ambulatory Visit: Payer: Self-pay | Admitting: Family Medicine

## 2016-11-11 DIAGNOSIS — I10 Essential (primary) hypertension: Secondary | ICD-10-CM

## 2016-11-11 DIAGNOSIS — F320 Major depressive disorder, single episode, mild: Secondary | ICD-10-CM

## 2016-11-11 MED ORDER — FUROSEMIDE 20 MG PO TABS: 20.0000 mg | ORAL_TABLET | Freq: Every day | ORAL | 1 refills | Status: DC

## 2016-11-11 MED ORDER — BUPROPION HCL ER (XL) 300 MG PO TB24: ORAL_TABLET | ORAL | 2 refills | Status: DC

## 2016-11-22 ENCOUNTER — Other Ambulatory Visit: Payer: Self-pay | Admitting: Family Medicine

## 2016-11-22 DIAGNOSIS — I10 Essential (primary) hypertension: Secondary | ICD-10-CM

## 2016-12-15 ENCOUNTER — Encounter: Payer: Self-pay | Admitting: Medical

## 2016-12-15 ENCOUNTER — Ambulatory Visit (INDEPENDENT_AMBULATORY_CARE_PROVIDER_SITE_OTHER): Payer: Medicare Other | Admitting: Medical

## 2016-12-15 VITALS — BP 155/85 | HR 95 | Temp 98.4°F | Resp 16 | Ht 63.0 in | Wt 169.2 lb

## 2016-12-15 DIAGNOSIS — S20162A Insect bite (nonvenomous) of breast, left breast, initial encounter: Secondary | ICD-10-CM

## 2016-12-15 DIAGNOSIS — W57XXXA Bitten or stung by nonvenomous insect and other nonvenomous arthropods, initial encounter: Secondary | ICD-10-CM | POA: Diagnosis not present

## 2016-12-15 MED ORDER — AMOXICILLIN 875 MG PO TABS: 875.0000 mg | ORAL_TABLET | Freq: Two times a day (BID) | ORAL | 0 refills | Status: DC

## 2016-12-15 NOTE — Progress Notes (Signed)
Pre visit review using our clinic review tool, if applicable. No additional management support is needed unless otherwise documented below in the visit note. 

## 2016-12-15 NOTE — Patient Instructions (Addendum)
For tick bite will rx amoxicillin antibiotic. With severe/high number of  Tick bites(#30) will rx amoxicillin. I would prefer to rx doxycycline since covers both potential rmsf and lyme. But severe extreme diarrhea history on previous use  Will ask you follow up in 10 days  I did explain to pt that in 10 days on follow up will get tick bite studies. Follow antibodies and see if will need to give doxy. Pt expressed understanding.

## 2016-12-15 NOTE — Progress Notes (Signed)
Subjective:    Patient ID: Samantha Davidson, female    DOB: March 19, 1946, 71 y.o.   MRN: [id number]  HPI  Pt in reporting she has had a lot of tick bites recently about 3 days ago. Pt estimated maybe 30 tick bites. She has [place] house and 3 dogs. Pt states she found one tick 2-3 mm size tick under her left breast. Got this one off on Saturday. Another larger tick rt flank area over past weekend. Various tiny ticks over body as well. All removed this weekend.  Pt has history of + lyme test 10 yrs. Pt took amoxicillin for about 10 days.     Review of Systems  Constitutional: Negative for chills, fatigue and fever.  HENT: Negative for congestion and ear pain.   Respiratory: Negative for cough, chest tightness, shortness of breath and wheezing.   Cardiovascular: Negative for chest pain and palpitations.  Gastrointestinal: Negative for abdominal pain, constipation, diarrhea, nausea and vomiting.  Musculoskeletal: Negative for back pain, myalgias, neck pain and neck stiffness.  Skin: Negative for rash.       But tick bites.  Neurological: Negative for dizziness, seizures, speech difficulty, weakness and headaches.  Hematological: Negative for adenopathy. Does not bruise/bleed easily.  Psychiatric/Behavioral: Negative for behavioral problems and confusion.   Past Medical History:  Diagnosis Date  . Allergy   . Asthma   . Depression   . GERD (gastroesophageal reflux disease)   . History of DVT of lower extremity   . Hypertension   . Lymphocytic colitis   . Rectal polyp   . Tubular adenoma of colon      Social History   Social History  . Marital status: Married    Spouse name: N/A  . Number of children: 2  . Years of education: N/A   Occupational History  . 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 Smoking status: Former Research scientist (life sciences)  . Smokeless tobacco: Never Used     Comment: only when stressed  . Alcohol use  0.0 oz/week  . Drug use: No  . Sexual activity: Not on file   Other Topics Concern  . Not on file   Social History Narrative  . No narrative on file    Past Surgical History:  Procedure Laterality Date  . COLONOSCOPY    . FOOT SURGERY  1988   pin in both great toes  . TONSILLECTOMY AND ADENOIDECTOMY      Family History  Problem Relation Age of Onset  . Arthritis Maternal Grandmother   . Lung cancer Mother   . Heart disease Mother   . Mental illness Mother   . Cancer Father     Allergies  Allergen Reactions  . Codeine Nausea And Vomiting  . Doxycycline Diarrhea    Current Outpatient Prescriptions on File Prior to Visit  Medication Sig Dispense Refill  . acetaminophen (TYLENOL) 500 MG tablet Take 500 mg by mouth every 6 (six) hours as needed (for pain).     Marland Kitchen alendronate (FOSAMAX) 70 MG tablet Take 1 tablet (70 mg total) by mouth every 7 (seven) days. Take with a full glass of water on an empty stomach. 4 tablet 11  . amLODipine (NORVASC) 5 MG tablet TAKE 1 TABLET BY MOUTH EVERY DAY 90 tablet 1  . aspirin 81 MG EC tablet Take 81 mg by mouth daily.      . budesonide (ENTOCORT EC) 3 MG 24  hr capsule Take 3 capsules (9 mg total) by mouth daily. 90 capsule 1  . buPROPion (WELLBUTRIN XL) 300 MG 24 hr tablet TAKE 1 TABLET (300 MG TOTAL) BY MOUTH EVERY MORNING 90 tablet 2  . Cholecalciferol (VITAMIN D) 2000 UNITS CAPS Take 2 capsules by mouth daily.      . citalopram (CELEXA) 10 MG tablet TAKE 1 TABLET (10 MG TOTAL) BY MOUTH DAILY. 90 tablet 1  . citalopram (CELEXA) 10 MG tablet TAKE 1 TABLET (10 MG TOTAL) BY MOUTH DAILY. 90 tablet 0  . Diclofenac Sodium 1.5 % SOLN Apply 1 application topically 2 (two) times daily as needed. For pain.  0  . EPINEPHrine (EPIPEN) 0.3 mg/0.3 mL DEVI Inject 0.3 mLs (0.3 mg total) into the muscle once. 1 Device 1  . fexofenadine (ALLEGRA) 180 MG tablet Take 1 tablet (180 mg total) by mouth daily. 30 tablet 11  . Fluticasone-Salmeterol (ADVAIR  DISKUS) 250-50 MCG/DOSE AEPB Inhale 1 puff into the lungs 2 (two) times daily. 60 each 5  . furosemide (LASIX) 20 MG tablet Take 1 tablet (20 mg total) by mouth daily. 90 tablet 1  . Multiple Vitamin (MULTIVITAMIN PO) Take 1 tablet by mouth daily.      . TraMADol HCl 50 MG TBDP Take 1 tablet by mouth 2 (two) times daily as needed. 15 tablet 0  . traZODone (DESYREL) 50 MG tablet Take 1/4 tab 30 tablet 2   No current facility-administered medications on file prior to visit.     BP (!) 155/85 (BP Location: Right Arm, Patient Position: Sitting, Cuff Size: Normal)   Pulse 95   Temp 98.4 F (36.9 C) (Oral)   Resp 16   Ht 5\' 3"  (1.6 m)   Wt 169 lb 3.2 oz (76.7 kg)   SpO2 95%   BMI 29.97 kg/m      Objective:   Physical Exam   General- No acute distress. Pleasant patient. Neck- Full range of motion, no jvd Lungs- Clear, even and unlabored. Heart- regular rate and rhythm. Neurologic- CNII- XII grossly intact. Abdomen- soft non-tender, non distended, + bs. No rebound no guarding. Lymphatic exam- no lymphadenopathy. skin- various scattered red marks where ticks bit and removed. No residual head or portion of tick seen on inspection.      Assessment & Plan:  For tick bite will rx amoxicillin antibiotic. With severe/high number of  Tick bites(#30) will rx amoxicillin. I would prefer to rx doxycycline since covers both potential rmsf and lyme. But severe extreme diarrhea history on previous use  Will ask you follow up in 10 days  I did explain to pt that in 10 days on follow up will get tick bite studies. Follow antibodies and see if will need to give doxy. Pt expressed understanding.

## 2016-12-24 ENCOUNTER — Ambulatory Visit (INDEPENDENT_AMBULATORY_CARE_PROVIDER_SITE_OTHER): Payer: Medicare Other | Admitting: Medical

## 2016-12-24 VITALS — BP 145/91 | HR 60 | Temp 98.2°F | Ht 63.0 in | Wt 170.4 lb

## 2016-12-24 DIAGNOSIS — W57XXXD Bitten or stung by nonvenomous insect and other nonvenomous arthropods, subsequent encounter: Secondary | ICD-10-CM | POA: Diagnosis not present

## 2016-12-24 DIAGNOSIS — I10 Essential (primary) hypertension: Secondary | ICD-10-CM

## 2016-12-24 DIAGNOSIS — S20162D Insect bite (nonvenomous) of breast, left breast, subsequent encounter: Secondary | ICD-10-CM

## 2016-12-24 DIAGNOSIS — W57XXXA Bitten or stung by nonvenomous insect and other nonvenomous arthropods, initial encounter: Secondary | ICD-10-CM

## 2016-12-24 DIAGNOSIS — T148XXA Other injury of unspecified body region, initial encounter: Secondary | ICD-10-CM | POA: Diagnosis not present

## 2016-12-24 MED ORDER — AMLODIPINE BESYLATE 5 MG PO TABS: 5.0000 mg | ORAL_TABLET | Freq: Every day | ORAL | 1 refills | Status: DC

## 2016-12-24 NOTE — Progress Notes (Signed)
Subjective:    Patient ID: Samantha Davidson, female    DOB: 10/02/1945, 71 y.o.   MRN: [id number]  HPI   Pt in for follow up.   Pt states feel better after taking doxy for her tick bites. Pt has no tick disease/related signs or symptoms on review. Pt never got labs done. We had discussion on her coming in real early and therefore at that time antibodies would have been negative. She did start the antibiotics and wanted to do test at 2 week post bite date.  Review of Systems  Constitutional: Negative for chills, fatigue and fever.  Respiratory: Negative for cough, chest tightness, shortness of breath and stridor.   Cardiovascular: Negative for chest pain and palpitations.  Gastrointestinal: Negative for abdominal pain, blood in stool, nausea and vomiting.  Musculoskeletal: Negative for back pain, gait problem, neck pain and neck stiffness.  Skin: Negative for pallor and rash.  Neurological: Negative for seizures, numbness and headaches.  Hematological: Negative for adenopathy. Does not bruise/bleed easily.  Psychiatric/Behavioral: Negative for behavioral problems and decreased concentration.    Past Medical History:  Diagnosis Date  . Allergy   . Asthma   . Depression   . GERD (gastroesophageal reflux disease)   . History of DVT of lower extremity   . Hypertension   . Lymphocytic colitis   . Rectal polyp   . Tubular adenoma of colon      Social History   Social History  . Marital status: Married    Spouse name: N/A  . Number of children: 2  . Years of education: N/A   Occupational History  . 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 Smoking status: Former Research scientist (life sciences)  . Smokeless tobacco: Never Used     Comment: only when stressed  . Alcohol use 0.0 oz/week  . Drug use: No  . Sexual activity: Not on file   Other Topics Concern  . Not on file   Social History Narrative  . No narrative on file     Past Surgical History:  Procedure Laterality Date  . COLONOSCOPY    . FOOT SURGERY  1988   pin in both great toes  . TONSILLECTOMY AND ADENOIDECTOMY      Family History  Problem Relation Age of Onset  . Arthritis Maternal Grandmother   . Lung cancer Mother   . Heart disease Mother   . Mental illness Mother   . Cancer Father     Allergies  Allergen Reactions  . Codeine Nausea And Vomiting  . Doxycycline Diarrhea    Current Outpatient Prescriptions on File Prior to Visit  Medication Sig Dispense Refill  . acetaminophen (TYLENOL) 500 MG tablet Take 500 mg by mouth every 6 (six) hours as needed (for pain).     Marland Kitchen alendronate (FOSAMAX) 70 MG tablet Take 1 tablet (70 mg total) by mouth every 7 (seven) days. Take with a full glass of water on an empty stomach. 4 tablet 11  . amLODipine (NORVASC) 5 MG tablet TAKE 1 TABLET BY MOUTH EVERY DAY 90 tablet 1  . amoxicillin (AMOXIL) 875 MG tablet Take 1 tablet (875 mg total) by mouth 2 (two) times daily. 20 tablet 0  . aspirin 81 MG EC tablet Take 81 mg by mouth daily.      . budesonide (ENTOCORT EC) 3 MG 24 hr capsule Take 3 capsules (9 mg total) by mouth daily. 90 capsule  1  . buPROPion (WELLBUTRIN XL) 300 MG 24 hr tablet TAKE 1 TABLET (300 MG TOTAL) BY MOUTH EVERY MORNING 90 tablet 2  . Cholecalciferol (VITAMIN D) 2000 UNITS CAPS Take 2 capsules by mouth daily.      . citalopram (CELEXA) 10 MG tablet TAKE 1 TABLET (10 MG TOTAL) BY MOUTH DAILY. 90 tablet 1  . citalopram (CELEXA) 10 MG tablet TAKE 1 TABLET (10 MG TOTAL) BY MOUTH DAILY. 90 tablet 0  . Diclofenac Sodium 1.5 % SOLN Apply 1 application topically 2 (two) times daily as needed. For pain.  0  . EPINEPHrine (EPIPEN) 0.3 mg/0.3 mL DEVI Inject 0.3 mLs (0.3 mg total) into the muscle once. 1 Device 1  . fexofenadine (ALLEGRA) 180 MG tablet Take 1 tablet (180 mg total) by mouth daily. 30 tablet 11  . Fluticasone-Salmeterol (ADVAIR DISKUS) 250-50 MCG/DOSE AEPB Inhale 1 puff into the  lungs 2 (two) times daily. 60 each 5  . furosemide (LASIX) 20 MG tablet Take 1 tablet (20 mg total) by mouth daily. 90 tablet 1  . Multiple Vitamin (MULTIVITAMIN PO) Take 1 tablet by mouth daily.      . TraMADol HCl 50 MG TBDP Take 1 tablet by mouth 2 (two) times daily as needed. 15 tablet 0  . traZODone (DESYREL) 50 MG tablet Take 1/4 tab 30 tablet 2   No current facility-administered medications on file prior to visit.     BP (!) 145/91 (BP Location: Right Arm, Patient Position: Sitting, Cuff Size: Normal)   Pulse 60   Temp 98.2 F (36.8 C) (Oral)   Ht 5\' 3"  (1.6 m)   Wt 170 lb 6.4 oz (77.3 kg)   BMI 30.19 kg/m      Objective:   Physical Exam  General- No acute distress. Pleasant patient. Neck- Full range of motion, no jvd Lungs- Clear, even and unlabored. Heart- regular rate and rhythm. Neurologic- CNII- XII grossly intact. Skin- no rashes. Lymphatic exam- negative.        Assessment & Plan:  Your bp is mild high today but you just took bp med this am before coming in. Refill bp med today.  For tick bite studies could get studies done today. Not required but if acute antibody  positive might extend doxy limited number of days.(1st visit was too early to do studies)  Follow up as regularly scheduled with pcp or as needed  Kory Panjwani, Percell Miller, [employer]

## 2016-12-24 NOTE — Patient Instructions (Signed)
Your bp is mild high today but you just took bp med this am before coming in. Refill bp med today.  For tick bite studies could get studies done today. Not required but if acute antibody  positive might extend doxy limited number of days.(1st visit was too early to do studies)  Follow up as regularly scheduled with pcp or as needed

## 2016-12-27 LAB — ROCKY MTN SPOTTED FVR ABS PNL(IGG+IGM)
RMSF IgG: NOT DETECTED
RMSF IgM: NOT DETECTED

## 2016-12-27 LAB — LYME AB/WESTERN BLOT REFLEX: B burgdorferi Ab IgG+IgM: <0.9 Index (ref <0.90)

## 2016-12-30 LAB — LYME ABY, WSTRN BLT IGG & IGM W/BANDS

## 2017-02-18 ENCOUNTER — Other Ambulatory Visit: Payer: Self-pay | Admitting: Family Medicine

## 2017-02-19 ENCOUNTER — Other Ambulatory Visit: Payer: Self-pay | Admitting: Family Medicine

## 2017-03-27 ENCOUNTER — Other Ambulatory Visit: Payer: Self-pay | Admitting: Family Medicine

## 2017-04-05 ENCOUNTER — Telehealth: Payer: Self-pay | Admitting: Family Medicine

## 2017-04-05 NOTE — Telephone Encounter (Signed)
Called pt to schedule AWV. Lvm for pt to call office to schedule appt. Last AWV 03/15/2012. Appointment can be scheduled at anytime. SF

## 2017-06-02 ENCOUNTER — Other Ambulatory Visit: Payer: Self-pay | Admitting: Family Medicine

## 2017-06-02 DIAGNOSIS — Z23 Encounter for immunization: Secondary | ICD-10-CM | POA: Diagnosis not present

## 2017-06-02 DIAGNOSIS — I10 Essential (primary) hypertension: Secondary | ICD-10-CM

## 2017-11-15 ENCOUNTER — Emergency Department (HOSPITAL_BASED_OUTPATIENT_CLINIC_OR_DEPARTMENT_OTHER)
Admission: EM | Admit: 2017-11-15 | Discharge: 2017-11-15 | Disposition: A | Discharge status: Home / Self Care | Payer: Medicare Other | Attending: Emergency Medicine | Admitting: Emergency Medicine

## 2017-11-15 ENCOUNTER — Emergency Department (HOSPITAL_BASED_OUTPATIENT_CLINIC_OR_DEPARTMENT_OTHER): Payer: Medicare Other

## 2017-11-15 ENCOUNTER — Other Ambulatory Visit: Payer: Self-pay

## 2017-11-15 ENCOUNTER — Encounter (HOSPITAL_BASED_OUTPATIENT_CLINIC_OR_DEPARTMENT_OTHER): Payer: Self-pay | Admitting: *Deleted

## 2017-11-15 ENCOUNTER — Ambulatory Visit: Payer: Self-pay | Admitting: *Deleted

## 2017-11-15 DIAGNOSIS — Z7982 Long term (current) use of aspirin: Secondary | ICD-10-CM | POA: Insufficient documentation

## 2017-11-15 DIAGNOSIS — Y929 Unspecified place or not applicable: Secondary | ICD-10-CM | POA: Insufficient documentation

## 2017-11-15 DIAGNOSIS — M542 Cervicalgia: Secondary | ICD-10-CM | POA: Diagnosis not present

## 2017-11-15 DIAGNOSIS — S42031A Displaced fracture of lateral end of right clavicle, initial encounter for closed fracture: Secondary | ICD-10-CM | POA: Diagnosis not present

## 2017-11-15 DIAGNOSIS — I10 Essential (primary) hypertension: Secondary | ICD-10-CM | POA: Insufficient documentation

## 2017-11-15 DIAGNOSIS — S40011A Contusion of right shoulder, initial encounter: Secondary | ICD-10-CM | POA: Diagnosis not present

## 2017-11-15 DIAGNOSIS — Z87891 Personal history of nicotine dependence: Secondary | ICD-10-CM | POA: Insufficient documentation

## 2017-11-15 DIAGNOSIS — S42001A Fracture of unspecified part of right clavicle, initial encounter for closed fracture: Secondary | ICD-10-CM

## 2017-11-15 DIAGNOSIS — W108XXA Fall (on) (from) other stairs and steps, initial encounter: Secondary | ICD-10-CM | POA: Diagnosis not present

## 2017-11-15 DIAGNOSIS — M549 Dorsalgia, unspecified: Secondary | ICD-10-CM | POA: Diagnosis not present

## 2017-11-15 DIAGNOSIS — Z79899 Other long term (current) drug therapy: Secondary | ICD-10-CM | POA: Insufficient documentation

## 2017-11-15 DIAGNOSIS — Y9301 Activity, walking, marching and hiking: Secondary | ICD-10-CM | POA: Diagnosis not present

## 2017-11-15 DIAGNOSIS — Y999 Unspecified external cause status: Secondary | ICD-10-CM | POA: Diagnosis not present

## 2017-11-15 DIAGNOSIS — J45909 Unspecified asthma, uncomplicated: Secondary | ICD-10-CM | POA: Insufficient documentation

## 2017-11-15 DIAGNOSIS — S20219A Contusion of unspecified front wall of thorax, initial encounter: Secondary | ICD-10-CM | POA: Diagnosis not present

## 2017-11-15 DIAGNOSIS — S199XXA Unspecified injury of neck, initial encounter: Secondary | ICD-10-CM | POA: Diagnosis not present

## 2017-11-15 DIAGNOSIS — S20211A Contusion of right front wall of thorax, initial encounter: Secondary | ICD-10-CM | POA: Diagnosis not present

## 2017-11-15 DIAGNOSIS — S0990XA Unspecified injury of head, initial encounter: Secondary | ICD-10-CM | POA: Diagnosis not present

## 2017-11-15 LAB — COMPREHENSIVE METABOLIC PANEL
ALT: 17 U/L (ref 14–54)
AST: 20 U/L (ref 15–41)
Albumin: 4.1 g/dL (ref 3.5–5.0)
Alkaline Phosphatase: 67 U/L (ref 38–126)
Anion gap: 11 (ref 5–15)
BUN: 14 mg/dL (ref 6–20)
CO2: 23 mmol/L (ref 22–32)
Calcium: 8.9 mg/dL (ref 8.9–10.3)
Chloride: 103 mmol/L (ref 101–111)
Creatinine, Ser: 0.7 mg/dL (ref 0.44–1.00)
GFR calc Af Amer: >60 mL/min (ref >60)
GFR calc non Af Amer: >60 mL/min (ref >60)
Glucose, Bld: 101 mg/dL — ABNORMAL HIGH (ref 65–99)
Potassium: 3.7 mmol/L (ref 3.5–5.1)
Sodium: 137 mmol/L (ref 135–145)
Total Bilirubin: 0.6 mg/dL (ref 0.3–1.2)
Total Protein: 6.5 g/dL (ref 6.5–8.1)

## 2017-11-15 LAB — CBC WITH DIFFERENTIAL/PLATELET
Basophils Absolute: 0 10*3/uL (ref 0.0–0.1)
Basophils Relative: 0 %
Eosinophils Absolute: 0.1 10*3/uL (ref 0.0–0.7)
Eosinophils Relative: 1 %
HCT: 36.3 % (ref 36.0–46.0)
Hemoglobin: 12.3 g/dL (ref 12.0–15.0)
Lymphocytes Relative: 23 %
Lymphs Abs: 1.9 10*3/uL (ref 0.7–4.0)
MCH: 33.3 pg (ref 26.0–34.0)
MCHC: 33.9 g/dL (ref 30.0–36.0)
MCV: 98.4 fL (ref 78.0–100.0)
Monocytes Absolute: 0.7 10*3/uL (ref 0.1–1.0)
Monocytes Relative: 8 %
Neutro Abs: 5.4 10*3/uL (ref 1.7–7.7)
Neutrophils Relative %: 68 %
Platelets: 257 10*3/uL (ref 150–400)
RBC: 3.69 MIL/uL — ABNORMAL LOW (ref 3.87–5.11)
RDW: 12.1 % (ref 11.5–15.5)
WBC: 8.2 10*3/uL (ref 4.0–10.5)

## 2017-11-15 MED ORDER — IOPAMIDOL (ISOVUE-300) INJECTION 61%
100.0000 mL | Freq: Once | INTRAVENOUS | Status: AC | PRN
  Administered 2017-11-15: 80 mL via INTRAVENOUS

## 2017-11-15 MED ORDER — TRAMADOL HCL 50 MG PO TABS: 50.0000 mg | ORAL_TABLET | Freq: Four times a day (QID) | ORAL | 0 refills | Status: DC | PRN

## 2017-11-15 MED FILL — traMADol HCL 50 MG TABS: 50 | 4 days supply | Qty: 15 | Fill #0

## 2017-11-15 NOTE — ED Triage Notes (Signed)
She slipped and fell down 12 steps a week ago. She landed on her chest shoulder and chest. Bruising to her chest and right scapula. Deformity to her clavicle.

## 2017-11-15 NOTE — Discharge Instructions (Signed)
You have a clavicle fracture that is likely causing your pain and bruising.   Wear sling during the day, you may remove it at night or when you take a shower.   No heavy lifting until cleared by orthopedic doctor  See orthopedic doctor for follow up   Return to ER if you have worse shoulder or clavicle pain, chest pain, trouble breathing.

## 2017-11-15 NOTE — Telephone Encounter (Signed)
agree

## 2017-11-15 NOTE — ED Provider Notes (Signed)
[facility] Provider Note   CSN: [id number] Arrival date & time: 11/15/17  1143     History   Chief Complaint Chief Complaint  Patient presents with  . Fall    HPI Patches Mcdonnell is a 72 y.o. female hx of HTN, osteoporosis here with bruising of right chest and back after a fall.  Patient states that she had a mechanical fall down about 12 steps a week ago.  She states that she slipped and fell and had 3 dogs with her and did hit her head as well as her scapula and may be the right clavicle.  She states that she been taking ibuprofen as needed for pain and that is controlling her pain.  However over the last week or so, she noticed progressive worsening swelling and ecchymosis of the right breast as well as the right upper scapular area.  She is not on any anticoagulation currently.  She also noticed some bruising in the right scalp area but denies any headache or vomiting.   The history is provided by the patient.    Past Medical History:  Diagnosis Date  . Allergy   . Asthma   . Depression   . GERD (gastroesophageal reflux disease)   . History of DVT of lower extremity   . Hypertension   . Lymphocytic colitis   . Rectal polyp   . Tubular adenoma of colon     Patient Active Problem List   Diagnosis Date Noted  . Pain in joint, pelvic region and thigh 12/16/2014  . Lower extremity pain 12/16/2014  . Pain in joint, ankle and foot 12/16/2014  . Abrasions of multiple sites 12/16/2014  . Pain in finger of right hand 05/29/2014  . Sciatica 02/25/2014  . Joint stiffness 02/25/2014  . Medicare annual wellness visit, subsequent 03/15/2012  . Personal history of adenomatous colonic polyps 02/29/2012  . Lymphocytic colitis 02/29/2012  . HTN (hypertension) 04/26/2011  . Depression with anxiety 04/26/2011  . Insomnia 04/26/2011  . Asthma 04/26/2011    Past Surgical History:  Procedure Laterality Date  . COLONOSCOPY    . FOOT SURGERY   1988   pin in both great toes  . TONSILLECTOMY AND ADENOIDECTOMY       OB History   None      Home Medications    Prior to Admission medications   Medication Sig Start Date End Date Taking? Authorizing Provider  acetaminophen (TYLENOL) 500 MG tablet Take 500 mg by mouth every 6 (six) hours as needed (for pain).     [provider]  alendronate (FOSAMAX) 70 MG tablet TAKE 1 TABLET BY MOUTH EVERY 7 DAYS TAKE WITH A FULLL GLASS OF WATER ON AN EMPTY STOMACH 03/28/17   Carollee Herter, Yvonne R, DO  amLODipine (NORVASC) 5 MG tablet Take 1 tablet (5 mg total) by mouth daily. 12/24/16   Saguier, Percell Miller, PA-C  amoxicillin (AMOXIL) 875 MG tablet Take 1 tablet (875 mg total) by mouth 2 (two) times daily. 12/15/16   Saguier, Percell Miller, PA-C  aspirin 81 MG EC tablet Take 81 mg by mouth daily.      [provider]  budesonide (ENTOCORT EC) 3 MG 24 hr capsule Take 3 capsules (9 mg total) by mouth daily. 04/01/15   Ann Held, DO  buPROPion (WELLBUTRIN XL) 300 MG 24 hr tablet TAKE 1 TABLET (300 MG TOTAL) BY MOUTH EVERY MORNING 11/11/16   Ann Held, DO  Cholecalciferol (VITAMIN D) 2000  UNITS CAPS Take 2 capsules by mouth daily.      [provider]  citalopram (CELEXA) 10 MG tablet TAKE 1 TABLET (10 MG TOTAL) BY MOUTH DAILY. 10/18/16   Ann Held, DO  citalopram (CELEXA) 10 MG tablet TAKE 1 TABLET (10 MG TOTAL) BY MOUTH DAILY. 11/09/16   Roma Schanz R, DO  Diclofenac Sodium 1.5 % SOLN Apply 1 application topically 2 (two) times daily as needed. For pain. 06/19/14   [provider]  EPINEPHrine (EPIPEN) 0.3 mg/0.3 mL DEVI Inject 0.3 mLs (0.3 mg total) into the muscle once. 05/17/11   Ann Held, DO  fexofenadine (ALLEGRA) 180 MG tablet Take 1 tablet (180 mg total) by mouth daily. 07/27/11   Ann Held, DO  Fluticasone-Salmeterol (ADVAIR DISKUS) 250-50 MCG/DOSE AEPB Inhale 1 puff into the lungs 2 (two) times daily. 04/06/16    Roma Schanz R, DO  furosemide (LASIX) 20 MG tablet TAKE 1 TABLET BY MOUTH EVERY DAY 06/02/17   Ann Held, DO  Multiple Vitamin (MULTIVITAMIN PO) Take 1 tablet by mouth daily.      [provider]  TraMADol HCl 50 MG TBDP Take 1 tablet by mouth 2 (two) times daily as needed. 12/23/14   Colon Branch, MD  traZODone (DESYREL) 50 MG tablet Take 1/4 tab 04/06/16   Ann Held, DO    Family History Family History  Problem Relation Age of Onset  . Lung cancer Mother   . Heart disease Mother   . Mental illness Mother   . Cancer Father   . Arthritis Maternal Grandmother     Social History Social History   Tobacco Use  . Smoking status: Former Research scientist (life sciences)  . Smokeless tobacco: Never Used  . Tobacco comment: only when stressed  Substance Use Topics  . Alcohol use: Yes    Alcohol/week: 0.0 oz  . Drug use: No     Allergies   Codeine and Doxycycline   Review of Systems Review of Systems  Cardiovascular:       Chest wall pain   Musculoskeletal: Positive for back pain.  All other systems reviewed and are negative.    Physical Exam Updated Vital Signs BP (!) 160/91   Pulse 74   Temp 98.5 F (36.9 C) (Oral)   Resp 18   Ht 5' 3.5" (1.613 m)   Wt 77.1 kg (170 lb)   SpO2 98%   BMI 29.64 kg/m   Physical Exam  Constitutional: She is oriented to person, place, and time. She appears well-developed and well-nourished.  HENT:  Head: Normocephalic.  Bruising R frontal scalp, ? Small scalp hematoma.   Eyes: Pupils are equal, round, and reactive to light. Conjunctivae and EOM are normal.  Neck: Normal range of motion. Neck supple.  Mild R paracervical tenderness, no midline tenderness   Cardiovascular: Normal rate, regular rhythm and normal heart sounds.  Pulmonary/Chest: Effort normal and breath sounds normal.  Bruising R breast area. Mild tenderness R sternal clavicular joint and R clavicle. Bruising and tenderness R scapula   Abdominal: Soft.  Bowel sounds are normal. She exhibits no distension. There is no tenderness.  No abdominal bruising or ecchymosis   Musculoskeletal: Normal range of motion.  No midline spinal tenderness, nl ROM bilateral hips, no obvious extremity trauma   Neurological: She is alert and oriented to person, place, and time. She displays normal reflexes. No cranial nerve deficit. Coordination normal.  Skin: Skin is  warm.  Psychiatric: She has a normal mood and affect.  Nursing note and vitals reviewed.    ED Treatments / Results  Labs (all labs ordered are listed, but only abnormal results are displayed) Labs Reviewed  CBC WITH DIFFERENTIAL/PLATELET - Abnormal; Notable for the following components:      Result Value   RBC 3.69 (*)    All other components within normal limits  COMPREHENSIVE METABOLIC PANEL - Abnormal; Notable for the following components:   Glucose, Bld 101 (*)    All other components within normal limits    EKG None  Radiology Dg Clavicle Right  Result Date: 11/15/2017 CLINICAL DATA:  Fall.  Pain.  Bruising. EXAM: RIGHT CLAVICLE - 2+ VIEWS COMPARISON:  No recent. FINDINGS: Acromioclavicular degenerative change. Corticated bony density noted adjacent to the acromion is most likely related to degenerative change. No evidence of acute fracture or dislocation. IMPRESSION: Degenerative changes right acromioclavicular joint. No acute abnormality identified. Right clavicle is intact. Electronically Signed   By: Marcello Moores  Register   On: 11/15/2017 12:34   Ct Head Wo Contrast  Result Date: 11/15/2017 CLINICAL DATA:  72 year old female status post fall on stairs 8 days ago. Right lateral neck pain, right forehead ecchymosis, right shoulder pain. EXAM: CT HEAD WITHOUT CONTRAST CT CERVICAL SPINE WITHOUT CONTRAST TECHNIQUE: Multidetector CT imaging of the head and cervical spine was performed following the standard protocol without intravenous contrast. Multiplanar CT image reconstructions of the  cervical spine were also generated. COMPARISON:  Cervical spine MRI 01/04/2012. FINDINGS: CT HEAD FINDINGS Brain: No midline shift, ventriculomegaly, mass effect, evidence of mass lesion, intracranial hemorrhage or evidence of cortically based acute infarction. Gray-white matter differentiation is within normal limits throughout the brain. No encephalomalacia is evident. Vascular: Calcified atherosclerosis at the skull base. The distal left vertebral artery appears dominant. No suspicious intracranial vascular hyperdensity. Skull: Intact. Sinuses/Orbits: Visualized paranasal sinuses and mastoids are clear. Other: No scalp hematoma identified. Visualized orbit soft tissues are within normal limits. CT CERVICAL SPINE FINDINGS Alignment: Chronic multilevel mild spondylolisthesis in the cervical spine appears increased since 2013. There is 1-2 millimeters of anterolisthesis on C2-C3, and continuing through C5-C6, which is most pronounced. C6-C7 is spared, then there is 1-2 millimeters of anterolisthesis of C7 on T1. Bilateral posterior element alignment is within normal limits. Skull base and vertebrae: Visualized skull base is intact. No atlanto-occipital dissociation. No cervical spine fracture. Soft tissues and spinal canal: No prevertebral fluid or swelling. No visible canal hematoma. Negative noncontrast upper neck soft tissues aside from calcified carotid artery atherosclerosis. There is hematoma and/or contusion along the caudal aspect of the right sternocleidomastoid muscle continuing to the medial right clavicle (see below). Disc levels: Widespread cervical facet degeneration, more so on the left. Moderate to severe facet arthropathy on the right at C4-C5, and on the left at C3-C4 and C5-C6. no cervical spinal stenosis suspected. Upper chest: Oblique mildly comminuted fracture through the medial metadiaphysis of the right clavicle with surrounding soft tissue hematoma/contusion. There is anterior displacement  of 4-5 millimeters. See series 6, image 81 and series 7, image 81. The medial left clavicle is intact. The visible upper thoracic levels appear intact. Negative lung apices. IMPRESSION: 1. Normal for age non contrast CT appearance of the brain. No skull fracture identified. 2. No acute fracture in the cervical spine. 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 3. Mildly displaced oblique fracture through the medial metadiaphysis of the right clavicle. Associated soft tissue hematoma/contusion  mostly affecting the caudal aspect of the right sternocleidomastoid muscle. Electronically Signed   By: Genevie Ann M.D.   On: 11/15/2017 15:05   Ct Chest W Contrast  Result Date: 11/15/2017 CLINICAL DATA:  Chest wall bruising after fall last week. EXAM: CT CHEST WITH CONTRAST TECHNIQUE: Multidetector CT imaging of the chest was performed during intravenous contrast administration. CONTRAST:  41mL ISOVUE-300 IOPAMIDOL (ISOVUE-300) INJECTION 61% COMPARISON:  None. FINDINGS: Cardiovascular: No significant vascular findings. Normal heart size. No pericardial effusion. Mediastinum/Nodes: No enlarged mediastinal, hilar, or axillary lymph nodes. Thyroid gland, trachea, and esophagus demonstrate no significant findings. Lungs/Pleura: Lungs are clear. No pleural effusion or pneumothorax. Upper Abdomen: No acute abnormality. Musculoskeletal: Mildly displaced fracture is seen involving the medial right clavicle. IMPRESSION: Mildly displaced fracture is seen involving the medial right clavicle. No other abnormality seen in the chest. Electronically Signed   By: Marijo Conception, M.D.   On: 11/15/2017 15:07   Ct Cervical Spine Wo Contrast  Result Date: 11/15/2017 CLINICAL DATA:  72 year old female status post fall on stairs 8 days ago. Right lateral neck pain, right forehead ecchymosis, right shoulder pain. EXAM: CT HEAD WITHOUT CONTRAST CT CERVICAL SPINE WITHOUT CONTRAST  TECHNIQUE: Multidetector CT imaging of the head and cervical spine was performed following the standard protocol without intravenous contrast. Multiplanar CT image reconstructions of the cervical spine were also generated. COMPARISON:  Cervical spine MRI 01/04/2012. FINDINGS: CT HEAD FINDINGS Brain: No midline shift, ventriculomegaly, mass effect, evidence of mass lesion, intracranial hemorrhage or evidence of cortically based acute infarction. Gray-white matter differentiation is within normal limits throughout the brain. No encephalomalacia is evident. Vascular: Calcified atherosclerosis at the skull base. The distal left vertebral artery appears dominant. No suspicious intracranial vascular hyperdensity. Skull: Intact. Sinuses/Orbits: Visualized paranasal sinuses and mastoids are clear. Other: No scalp hematoma identified. Visualized orbit soft tissues are within normal limits. CT CERVICAL SPINE FINDINGS Alignment: Chronic multilevel mild spondylolisthesis in the cervical spine appears increased since 2013. There is 1-2 millimeters of anterolisthesis on C2-C3, and continuing through C5-C6, which is most pronounced. C6-C7 is spared, then there is 1-2 millimeters of anterolisthesis of C7 on T1. Bilateral posterior element alignment is within normal limits. Skull base and vertebrae: Visualized skull base is intact. No atlanto-occipital dissociation. No cervical spine fracture. Soft tissues and spinal canal: No prevertebral fluid or swelling. No visible canal hematoma. Negative noncontrast upper neck soft tissues aside from calcified carotid artery atherosclerosis. There is hematoma and/or contusion along the caudal aspect of the right sternocleidomastoid muscle continuing to the medial right clavicle (see below). Disc levels: Widespread cervical facet degeneration, more so on the left. Moderate to severe facet arthropathy on the right at C4-C5, and on the left at C3-C4 and C5-C6. no cervical spinal stenosis  suspected. Upper chest: Oblique mildly comminuted fracture through the medial metadiaphysis of the right clavicle with surrounding soft tissue hematoma/contusion. There is anterior displacement of 4-5 millimeters. See series 6, image 81 and series 7, image 81. The medial left clavicle is intact. The visible upper thoracic levels appear intact. Negative lung apices. IMPRESSION: 1. Normal for age non contrast CT appearance of the brain. No skull fracture identified. 2. No acute fracture in the cervical spine. 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 3. Mildly displaced oblique fracture through the medial metadiaphysis of the right clavicle. Associated soft tissue hematoma/contusion mostly affecting the caudal aspect of the right sternocleidomastoid muscle. Electronically Signed   By: Herminio Heads.D.  On: 11/15/2017 15:05    Procedures Procedures (including critical care time)  Medications Ordered in ED Medications  iopamidol (ISOVUE-300) 61 % injection 100 mL (80 mLs Intravenous Contrast Given 11/15/17 1429)     Initial Impression / Assessment and Plan / ED Course  I have reviewed the triage vital signs and the nursing notes.  Pertinent labs & imaging results that were available during my care of the patient were reviewed by me and considered in my medical decision making (see chart for details).     Marialena Wollen is a 72 y.o. female here with bruising R chest and clavicle, head injury about a week ago. I think likely contusion. Given the size of ecchymosis, will check CBC and get CT head/neck/chest. She is not on blood thinners, has nonfocal neuro exam.   3:22 PM  Xrays showed no clavicle fracture but CT showed clavicle fracture. This likely explained her pain and her ecchymosis. Will place on sling immobilizer, have her follow up with ortho. Offered pain meds but she just wants tramadol. No heavy lifting for now    Final Clinical Impressions(s) / ED Diagnoses   Final diagnoses:  None    ED Discharge Orders    None       Drenda Freeze, MD 11/15/17 1524

## 2017-11-15 NOTE — Telephone Encounter (Signed)
Pt calling in stating she fell down 10 to 12 steps a week ago today. She is worried she has broken her collar bone and torn a ligament. She is black and blue. I called the office and spoke with Maudie Mercury she said the pt would need to go to the ER. Pt is leaving Thursday for [facility] patient per her provider that she needs to get evaluated at ED- she is going to go to [facility] ED. Reason for Disposition . Can't move injured shoulder at all  Answer Assessment - Initial Assessment Questions 1. MECHANISM: "How did the injury happen?"     Fall down steps 2. ONSET: "When did the injury happen?" (Minutes or hours ago)      1 week ago 3. APPEARANCE of INJURY: "What does the injury look like?"      Bruising and pain R arm, breast 4. SEVERITY: "Can you move the shoulder normally?"      no 5. SIZE: For cuts, bruises, or swelling, ask: "How large is it?" (e.g., inches or centimeters;  entire joint)      Bruising and swelling- twice to three times size 6. PAIN: "Is there pain?" If so, ask: "How bad is the pain?"   (e.g., Scale 1-10; or mild, moderate, severe)     Yes- stinging feeling on back side- when not moving- when moves arm- severe 7. TETANUS: For any breaks in the skin, ask: "When was the last tetanus booster?"     No break in skin 8. OTHER SYMPTOMS: "Do you have any other symptoms?" (e.g., loss of sensation)     No loss of sensation 9. PREGNANCY: "Is there any chance you are pregnant?" "When was your last menstrual period?"     n/a  Protocols used: SHOULDER INJURY-A-AH

## 2017-11-15 NOTE — Telephone Encounter (Signed)
FYI to PCP

## 2017-11-24 ENCOUNTER — Other Ambulatory Visit: Payer: Self-pay | Admitting: Family Medicine

## 2017-11-24 DIAGNOSIS — F320 Major depressive disorder, single episode, mild: Secondary | ICD-10-CM

## 2017-12-06 ENCOUNTER — Ambulatory Visit (INDEPENDENT_AMBULATORY_CARE_PROVIDER_SITE_OTHER): Payer: Medicare Other | Admitting: Orthopaedic Surgery

## 2017-12-06 ENCOUNTER — Encounter (INDEPENDENT_AMBULATORY_CARE_PROVIDER_SITE_OTHER): Payer: Self-pay | Admitting: Orthopaedic Surgery

## 2017-12-06 VITALS — BP 179/75 | HR 77 | Ht 63.5 in | Wt 168.0 lb

## 2017-12-06 DIAGNOSIS — S42011A Anterior displaced fracture of sternal end of right clavicle, initial encounter for closed fracture: Secondary | ICD-10-CM

## 2017-12-06 NOTE — Progress Notes (Signed)
Office Visit Note   Patient: Samantha Davidson           Date of Birth: 01/15/1946           MRN: [id number] Visit Date: 12/06/2017              Requested by: [address], [place], [place] [address] [place],  [address] PCP: Carollee Herter, Alferd Apa, DO   Assessment & Plan: Visit Diagnoses:  1. Closed anterior displaced fracture of sternal end of right clavicle, initial encounter     Plan: Patient has closed displaced fracture of the medial clavicle.  Her sling for ambulation remove it at home and avoid heavy lifting.  Conservative treatment recommended.  I plan to recheck her in 1 month.  CT scan images were reviewed as well as report.  Follow-Up Instructions: No follow-ups on file.   Orders:  No orders of the defined types were placed in this encounter.  No orders of the defined types were placed in this encounter.     Procedures: No procedures performed   Clinical Data: No additional findings.   Subjective: Chief Complaint  Patient presents with  . Right Shoulder - Fracture    HPI 72 year old female seen for a medial shaft right clavicle fracture after a fall down the stairs a week ago.  She had considerable bruising right chest wall.  She states she slipped and had 3 dogs with her.  She is seen in the emergency room on 11/15/2017 for initial x-ray was negative and CT scan demonstrated clavicle fracture with lateral fragment slightly anterior.  She had no compression of the great vessels.  She is been in a sling and has some prominence at the fracture site.  She denies shortness of breath.  Some soreness over the right brachial plexus.  Sensation to her hand has been is intact.  Review of Systems positive for history of asthma depression GERD history of DVT lower extremities hypertension.  Lymphocytic colitis.  Tubular adenoma of the colon no past history of previous clavicle injury.  Plain radiographs CT scan generative changes right  acromioclavicular joint.   Objective: Vital Signs: BP (!) 179/75   Pulse 77   Ht 5' 3.5" (1.613 m)   Wt 168 lb (76.2 kg)   BMI 29.29 kg/m   Physical Exam  Constitutional: She is oriented to person, place, and time. She appears well-developed.  HENT:  Head: Normocephalic.  Right Ear: External ear normal.  Left Ear: External ear normal.  Eyes: Pupils are equal, round, and reactive to light.  Neck: No tracheal deviation present. No thyromegaly present.  Cardiovascular: Normal rate.  Pulmonary/Chest: Effort normal. No stridor. No respiratory distress. She has no wheezes.  Abdominal: Soft.  Neurological: She is alert and oriented to person, place, and time.  Skin: Skin is warm and dry.  Psychiatric: She has a normal mood and affect. Her behavior is normal.    Ortho Exam patient has prominence of the right medial clavicle.  No ecchymosis is present.  She has some tenderness of the fracture site.  Arm is in a sling.  Deltoid sensation is intact sensation of the fingers hands are intact bilaterally.  No dyspnea no supraclavicular lymphadenopathy.  She does have brachial plexus tenderness on the right and some mild supraclavicular edema secondary to the fracture.  Specialty Comments:  No specialty comments available.  Imaging:CLINICAL DATA:  Chest wall bruising after fall last week.  EXAM: CT CHEST WITH CONTRAST  TECHNIQUE: Multidetector CT imaging of the chest was performed during intravenous contrast administration.  CONTRAST:  56mL ISOVUE-300 IOPAMIDOL (ISOVUE-300) INJECTION 61%  COMPARISON:  None.  FINDINGS: Cardiovascular: No significant vascular findings. Normal heart size. No pericardial effusion.  Mediastinum/Nodes: No enlarged mediastinal, hilar, or axillary lymph nodes. Thyroid gland, trachea, and esophagus demonstrate no significant findings.  Lungs/Pleura: Lungs are clear. No pleural effusion or pneumothorax.  Upper Abdomen: No acute  abnormality.  Musculoskeletal: Mildly displaced fracture is seen involving the medial right clavicle.  IMPRESSION: Mildly displaced fracture is seen involving the medial right clavicle. No other abnormality seen in the chest.   Electronically Signed   By: Marijo Conception, M.D.   On: 11/15/2017 15:07    PMFS History: Patient Active Problem List   Diagnosis Date Noted  . Pain in joint, pelvic region and thigh 12/16/2014  . Lower extremity pain 12/16/2014  . Pain in joint, ankle and foot 12/16/2014  . Abrasions of multiple sites 12/16/2014  . Pain in finger of right hand 05/29/2014  . Sciatica 02/25/2014  . Joint stiffness 02/25/2014  . Medicare annual wellness visit, subsequent 03/15/2012  . Personal history of adenomatous colonic polyps 02/29/2012  . Lymphocytic colitis 02/29/2012  . HTN (hypertension) 04/26/2011  . Depression with anxiety 04/26/2011  . Insomnia 04/26/2011  . Asthma 04/26/2011   Past Medical History:  Diagnosis Date  . Allergy   . Asthma   . Depression   . GERD (gastroesophageal reflux disease)   . History of DVT of lower extremity   . Hypertension   . Lymphocytic colitis   . Rectal polyp   . Tubular adenoma of colon     Family History  Problem Relation Age of Onset  . Lung cancer Mother   . Heart disease Mother   . Mental illness Mother   . Cancer Father   . Arthritis Maternal Grandmother     Past Surgical History:  Procedure Laterality Date  . COLONOSCOPY    . FOOT SURGERY  1988   pin in both great toes  . TONSILLECTOMY AND ADENOIDECTOMY     Social History   Occupational History  . Occupation: Art therapist: [employer]    Employer: [employer]  Tobacco Use  . Smoking status: Former Research scientist (life sciences)  . Smokeless tobacco: Never Used  . Tobacco comment: only when stressed  Substance and Sexual Activity  . Alcohol use: Yes    Alcohol/week: 0.0 oz  . Drug use: No  . Sexual activity: Not on file

## 2018-01-10 ENCOUNTER — Ambulatory Visit (INDEPENDENT_AMBULATORY_CARE_PROVIDER_SITE_OTHER): Payer: Medicare Other | Admitting: Orthopaedic Surgery

## 2018-01-16 ENCOUNTER — Other Ambulatory Visit: Payer: Self-pay | Admitting: Family Medicine

## 2018-01-16 DIAGNOSIS — I10 Essential (primary) hypertension: Secondary | ICD-10-CM

## 2018-01-18 DIAGNOSIS — H25813 Combined forms of age-related cataract, bilateral: Secondary | ICD-10-CM | POA: Diagnosis not present

## 2018-01-18 DIAGNOSIS — H5213 Myopia, bilateral: Secondary | ICD-10-CM | POA: Diagnosis not present

## 2018-01-18 DIAGNOSIS — H43813 Vitreous degeneration, bilateral: Secondary | ICD-10-CM | POA: Diagnosis not present

## 2018-03-10 ENCOUNTER — Other Ambulatory Visit: Payer: Self-pay

## 2018-05-18 ENCOUNTER — Other Ambulatory Visit: Payer: Self-pay | Admitting: Family Medicine

## 2018-05-18 DIAGNOSIS — I10 Essential (primary) hypertension: Secondary | ICD-10-CM

## 2018-05-21 ENCOUNTER — Other Ambulatory Visit: Payer: Self-pay | Admitting: Family Medicine

## 2018-05-21 DIAGNOSIS — F320 Major depressive disorder, single episode, mild: Secondary | ICD-10-CM

## 2018-06-27 ENCOUNTER — Other Ambulatory Visit: Payer: Self-pay

## 2018-06-29 ENCOUNTER — Ambulatory Visit (INDEPENDENT_AMBULATORY_CARE_PROVIDER_SITE_OTHER): Payer: Medicare Other | Admitting: Surgery

## 2018-06-29 ENCOUNTER — Ambulatory Visit (INDEPENDENT_AMBULATORY_CARE_PROVIDER_SITE_OTHER): Payer: Medicare Other

## 2018-06-29 ENCOUNTER — Encounter (INDEPENDENT_AMBULATORY_CARE_PROVIDER_SITE_OTHER): Payer: Self-pay | Admitting: Surgery

## 2018-06-29 VITALS — BP 160/89 | HR 64 | Ht 63.5 in | Wt 168.0 lb

## 2018-06-29 DIAGNOSIS — M7541 Impingement syndrome of right shoulder: Secondary | ICD-10-CM | POA: Diagnosis not present

## 2018-06-29 DIAGNOSIS — M25511 Pain in right shoulder: Secondary | ICD-10-CM | POA: Diagnosis not present

## 2018-06-29 NOTE — Progress Notes (Signed)
Office Visit Note   Patient: Samantha Davidson           Date of Birth: 25-Sep-1945           MRN: [id number] Visit Date: 06/29/2018              Requested by: [address] [place], [place] [address] [place], [place] [address] PCP: Carollee Herter, Alferd Apa, DO   Assessment & Plan: Visit Diagnoses:  1. Acute pain of right shoulder     Plan: At this point recommend given her shoulder little bit more time to see if he gets better on its own with oral NSAID.  Can continue over-the-counter ibuprofen as directed.  Use ice anterior shoulder off and on as needed.  Avoid any heavy lifting.  I do not think she needs a sling at this point.  Follow-up with me in 2 weeks for recheck.  If she still continues to have ongoing pain I did discuss possibly trying subacromial Marcaine/Depo-Medrol injection.  Follow-Up Instructions: Return in about 2 weeks (around 07/13/2018) for with [facility] recheck shoulder.   Orders:  Orders Placed This Encounter  Procedures  . XR Shoulder Right   No orders of the defined types were placed in this encounter.     Procedures: No procedures performed   Clinical Data: No additional findings.   Subjective: Chief Complaint  Patient presents with  . Right Shoulder - Pain    HPI 72 year old white female comes in today with right shoulder pain.  Patient last seen by Dr. Lorin Mercy April 2019 for medial clavicle fracture.  States that after that injury that caused a fracture she did have some discomfort in the right shoulder but has been doing reasonably well at this point.  A couple of days ago she was helping a friend move a heavy TV when she felt a pull in the anterior right shoulder.  Also has some discomfort posterior right shoulder.  No cervical radicular component.  She is been having some soreness in the shoulder with overhead activity and reaching on her back.  Some discomfort when she lays on her shoulder since the injury. Review of Systems No  current cardiac pulmonary GI GU issues  Objective: Vital Signs: BP (!) 160/89   Pulse 64   Ht 5' 3.5" (1.613 m)   Wt 168 lb (76.2 kg)   BMI 29.29 kg/m   Physical Exam  Constitutional: She is oriented to person, place, and time. No distress.  HENT:  Head: Normocephalic and atraumatic.  Eyes: Pupils are equal, round, and reactive to light.  Neck: Normal range of motion.  Pulmonary/Chest: No respiratory distress.  Musculoskeletal:  Exam right shoulder good range of motion.  Negative drop arm test.  Mild mildly positive impingement test.  She is tender over the proximal biceps tendon without tendon defect.  Good cuff strength without weakness.  Good biceps strength.  Neurovascular intact.  Patient does have some swelling and tenderness still over the medial clavicle fracture site.  Neurological: She is alert and oriented to person, place, and time.  Skin: Skin is warm and dry.  Psychiatric: She has a normal mood and affect.    Ortho Exam  Specialty Comments:  No specialty comments available.  Imaging: No results found.   PMFS History: Patient Active Problem List   Diagnosis Date Noted  . Pain in joint, pelvic region and thigh 12/16/2014  . Lower extremity pain 12/16/2014  . Pain in joint, ankle and  foot 12/16/2014  . Abrasions of multiple sites 12/16/2014  . Pain in finger of right hand 05/29/2014  . Sciatica 02/25/2014  . Joint stiffness 02/25/2014  . Medicare annual wellness visit, subsequent 03/15/2012  . Personal history of adenomatous colonic polyps 02/29/2012  . Lymphocytic colitis 02/29/2012  . HTN (hypertension) 04/26/2011  . Depression with anxiety 04/26/2011  . Insomnia 04/26/2011  . Asthma 04/26/2011   Past Medical History:  Diagnosis Date  . Allergy   . Asthma   . Depression   . GERD (gastroesophageal reflux disease)   . History of DVT of lower extremity   . Hypertension   . Lymphocytic colitis   . Rectal polyp   . Tubular adenoma of colon       Family History  Problem Relation Age of Onset  . Lung cancer Mother   . Heart disease Mother   . Mental illness Mother   . Cancer Father   . Arthritis Maternal Grandmother     Past Surgical History:  Procedure Laterality Date  . COLONOSCOPY    . FOOT SURGERY  1988   pin in both great toes  . TONSILLECTOMY AND ADENOIDECTOMY     Social History   Occupational History  . Occupation: Art therapist: [employer]    Employer: [employer],  Tobacco Use  . Smoking status: Former Research scientist (life sciences)  . Smokeless tobacco: Never Used  . Tobacco comment: only when stressed  Substance and Sexual Activity  . Alcohol use: Yes    Alcohol/week: 0.0 standard drinks  . Drug use: No  . Sexual activity: Not on file

## 2018-07-12 ENCOUNTER — Ambulatory Visit (INDEPENDENT_AMBULATORY_CARE_PROVIDER_SITE_OTHER): Payer: Medicare Other | Admitting: Surgery

## 2018-09-22 ENCOUNTER — Other Ambulatory Visit: Payer: Self-pay | Admitting: Family Medicine

## 2018-09-22 DIAGNOSIS — F320 Major depressive disorder, single episode, mild: Secondary | ICD-10-CM

## 2018-09-22 DIAGNOSIS — I10 Essential (primary) hypertension: Secondary | ICD-10-CM

## 2018-09-22 NOTE — Telephone Encounter (Signed)
Copied from [place] [phone] [phone]. Topic: Quick Communication - Rx Refill/Question >> Sep 22, 2018  3:05 PM Blase Mess A wrote: Medication: amLODipine (NORVASC) 5 MG tablet [[id number]] , citalopram (CELEXA) 10 MG tablet [[id number]] , hydrochlorothiazide (HYDRODIURIL) 25 MG tablet [[id number]]  DISCONTINUED ,  CPE scheduled 11/20/18  Has the patient contacted their pharmacy? Yes  (Agent: If no, request that the patient contact the pharmacy for the refill.) (Agent: If yes, when and what did the pharmacy advise?)  Preferred Pharmacy (with phone number or street name): [id number] [place], [place] [phone] (Phone) [phone] (Fax)    Agent: Please be advised that RX refills may take up to 3 business days. We ask that you follow-up with your pharmacy.

## 2018-09-22 NOTE — Telephone Encounter (Signed)
Requested medication (s) are due for refill today: Yes  Requested medication (s) are on the active medication list: Yes and no  Last refill: Amlodipine and Celexa 11/24/17; HCTZ last in 2016-not on current med list  Future visit scheduled: Yes  Notes to clinic:  Unable to refilled, failed items on protocol     Requested Prescriptions  Pending Prescriptions Disp Refills   amLODipine (NORVASC) 5 MG tablet 90 tablet 1    Sig: Take 1 tablet (5 mg total) by mouth daily.     Cardiovascular:  Calcium Channel Blockers Failed - 09/22/2018  3:19 PM      Failed - Last BP in normal range    BP Readings from Last 1 Encounters:  06/29/18 (!) 160/89         Failed - Valid encounter within last 6 months    Recent Outpatient Visits          1 year ago Tick bite, subsequent encounter   [facility] at [place], [place]   1 year ago Tick bite, initial Loss adjuster, chartered at [place], [place]   2 years ago Preventative health care   [place] at [place], [place]   3 years ago [place] at [place], DO   3 years ago Edema   Archivist at [place], DO      Future Appointments            In 1 month [place], [place] at [facility], [facility]          citalopram (CELEXA) 10 MG tablet 90 tablet 1    Sig: Take 1 tablet (10 mg total) by mouth daily.     Psychiatry:  Antidepressants - SSRI Failed - 09/22/2018  3:19 PM      Failed - Completed PHQ-2 or PHQ-9 in the last 360 days.      Failed - Valid encounter within last 6 months    Recent Outpatient Visits          1 year ago Tick bite, subsequent encounter   Arch[facility] at [place], [place]   1 year ago  Tick bite, initial Loss adjuster, chartered at [place], [place]   2 years ago Preventative health care   [place] at [place], DO   3 years ago [place] at [place], DO   3 years ago [place] at [place], DO      Future Appointments            In 1 month [place], [place] at [facility], [place]

## 2018-09-29 ENCOUNTER — Other Ambulatory Visit: Payer: Self-pay | Admitting: *Deleted

## 2018-09-29 DIAGNOSIS — I10 Essential (primary) hypertension: Secondary | ICD-10-CM

## 2018-09-29 DIAGNOSIS — F320 Major depressive disorder, single episode, mild: Secondary | ICD-10-CM

## 2018-09-29 MED ORDER — AMLODIPINE BESYLATE 5 MG PO TABS: 5.0000 mg | ORAL_TABLET | Freq: Every day | ORAL | 1 refills | Status: DC

## 2018-09-29 MED ORDER — CITALOPRAM HYDROBROMIDE 10 MG PO TABS: 10.0000 mg | ORAL_TABLET | Freq: Every day | ORAL | 1 refills | Status: DC

## 2018-10-20 ENCOUNTER — Other Ambulatory Visit: Payer: Self-pay | Admitting: Family Medicine

## 2018-10-20 DIAGNOSIS — I10 Essential (primary) hypertension: Secondary | ICD-10-CM

## 2018-11-20 ENCOUNTER — Ambulatory Visit (INDEPENDENT_AMBULATORY_CARE_PROVIDER_SITE_OTHER): Payer: Medicare Other | Admitting: Family Medicine

## 2018-11-20 ENCOUNTER — Encounter: Payer: Self-pay | Admitting: Family Medicine

## 2018-11-20 ENCOUNTER — Encounter: Payer: Medicare Other | Admitting: Family Medicine

## 2018-11-20 ENCOUNTER — Other Ambulatory Visit: Payer: Self-pay

## 2018-11-20 DIAGNOSIS — I10 Essential (primary) hypertension: Secondary | ICD-10-CM | POA: Diagnosis not present

## 2018-11-20 DIAGNOSIS — F320 Major depressive disorder, single episode, mild: Secondary | ICD-10-CM

## 2018-11-20 DIAGNOSIS — F418 Other specified anxiety disorders: Secondary | ICD-10-CM

## 2018-11-20 MED ORDER — FUROSEMIDE 20 MG PO TABS: 20.0000 mg | ORAL_TABLET | Freq: Every day | ORAL | 1 refills | Status: DC

## 2018-11-20 MED ORDER — AMLODIPINE BESYLATE 5 MG PO TABS: 5.0000 mg | ORAL_TABLET | Freq: Every day | ORAL | 1 refills | Status: DC

## 2018-11-20 MED ORDER — CITALOPRAM HYDROBROMIDE 10 MG PO TABS: 10.0000 mg | ORAL_TABLET | Freq: Every day | ORAL | 1 refills | Status: DC

## 2018-11-20 MED ORDER — BUPROPION HCL ER (XL) 300 MG PO TB24: ORAL_TABLET | ORAL | 1 refills | Status: DC
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
## 2018-11-20 NOTE — Assessment & Plan Note (Signed)
stable °

## 2018-11-20 NOTE — Assessment & Plan Note (Signed)
Controlled.  

## 2019-01-30 ENCOUNTER — Other Ambulatory Visit: Payer: Self-pay

## 2019-01-30 ENCOUNTER — Encounter: Payer: Self-pay | Admitting: Family Medicine

## 2019-01-30 ENCOUNTER — Ambulatory Visit (INDEPENDENT_AMBULATORY_CARE_PROVIDER_SITE_OTHER): Payer: Medicare Other | Admitting: Family Medicine

## 2019-01-30 VITALS — BP 142/92 | HR 82 | Temp 98.2°F | Resp 16 | Ht 63.0 in | Wt 173.0 lb

## 2019-01-30 DIAGNOSIS — Z0000 Encounter for general adult medical examination without abnormal findings: Secondary | ICD-10-CM

## 2019-01-30 DIAGNOSIS — F320 Major depressive disorder, single episode, mild: Secondary | ICD-10-CM

## 2019-01-30 DIAGNOSIS — Z23 Encounter for immunization: Secondary | ICD-10-CM | POA: Diagnosis not present

## 2019-01-30 DIAGNOSIS — I10 Essential (primary) hypertension: Secondary | ICD-10-CM

## 2019-01-30 MED ORDER — BUPROPION HCL ER (XL) 300 MG PO TB24: ORAL_TABLET | ORAL | 1 refills | Status: AC

## 2019-01-30 MED ORDER — CITALOPRAM HYDROBROMIDE 10 MG PO TABS: 10.0000 mg | ORAL_TABLET | Freq: Every day | ORAL | 1 refills | Status: DC

## 2019-01-30 MED ORDER — AMLODIPINE BESYLATE 5 MG PO TABS: 5.0000 mg | ORAL_TABLET | Freq: Every day | ORAL | 1 refills | Status: DC

## 2019-01-30 MED ORDER — FUROSEMIDE 20 MG PO TABS: 20.0000 mg | ORAL_TABLET | Freq: Every day | ORAL | 1 refills | Status: DC

## 2019-01-30 NOTE — Patient Instructions (Signed)

## 2019-01-30 NOTE — Progress Notes (Addendum)
Subjective:     Samantha Davidson is a 73 y.o. female and is here for a comprehensive physical exam. The patient reports no problems--- her husband just passed away a few weeks ago . She is doing ok with this She needs f/u bp as well   Social History   Socioeconomic History  . Marital status: Single    Spouse name: Not on file  . Number of children: 2  . Years of education: Not on file  . Highest education level: Not on file  Occupational History  . Occupation: Art therapist: [employer]    Employer: [employer],  Social Needs  . Financial resource strain: Not on file  . Food insecurity    Worry: Not on file    Inability: Not on file  . Transportation needs    Medical: Not on file    Non-medical: Not on file  Tobacco Use  . Smoking status: Former Research scientist (life sciences)  . Smokeless tobacco: Never Used  . Tobacco comment: only when stressed  Substance and Sexual Activity  . Alcohol use: Yes    Alcohol/week: 0.0 standard drinks  . Drug use: No  . Sexual activity: Not on file  Lifestyle  . Physical activity    Days per week: Not on file    Minutes per session: Not on file  . Stress: Not on file  Relationships  . Social Herbalist on phone: Not on file    Gets together: Not on file    Attends religious service: Not on file    Active member of club or organization: Not on file    Attends meetings of clubs or organizations: Not on file    Relationship status: Not on file  . Intimate partner violence    Fear of current or ex partner: Not on file    Emotionally abused: Not on file    Physically abused: Not on file    Forced sexual activity: Not on file  Other Topics Concern  . Not on file  Social History Narrative  . Not on file   Health Maintenance  Topic Date Due  . Hepatitis C Screening  1945-10-17  . COLONOSCOPY  02/22/2013  . MAMMOGRAM  04/29/2018  . INFLUENZA VACCINE  03/10/2019  . TETANUS/TDAP  12/22/2024  . DEXA SCAN   Completed  . PNA vac Low Risk Adult  Completed    The following portions of the patient's history were reviewed and updated as appropriate:  She  has a past medical history of Allergy, Asthma, Depression, GERD (gastroesophageal reflux disease), History of DVT of lower extremity, Hypertension, Lymphocytic colitis, Rectal polyp, and Tubular adenoma of colon. She does not have any pertinent problems on file. She  has a past surgical history that includes Foot surgery (1988); Colonoscopy; and Tonsillectomy and adenoidectomy. Her family history includes Arthritis in her maternal grandmother; Cancer in her father; Heart disease in her mother; Lung cancer in her mother; Mental illness in her mother. She  reports that she has quit smoking. She has never used smokeless tobacco. She reports current alcohol use. She reports that she does not use drugs. She has a current medication list which includes the following prescription(s): acetaminophen, amlodipine, aspirin, bupropion, vitamin d, citalopram, epinephrine, fexofenadine, fluticasone-salmeterol, furosemide, ibuprofen, multiple vitamin, and tramadol. Current Outpatient Medications on File Prior to Visit  Medication Sig Dispense Refill  . acetaminophen (TYLENOL) 500 MG tablet Take 500 mg by mouth every 6 (  six) hours as needed (for pain).     Marland Kitchen aspirin 81 MG EC tablet Take 81 mg by mouth daily.      . Cholecalciferol (VITAMIN D) 2000 UNITS CAPS Take 2 capsules by mouth daily.      Marland Kitchen EPINEPHrine (EPIPEN) 0.3 mg/0.3 mL DEVI Inject 0.3 mLs (0.3 mg total) into the muscle once. 1 Device 1  . fexofenadine (ALLEGRA) 180 MG tablet Take 1 tablet (180 mg total) by mouth daily. 30 tablet 11  . Fluticasone-Salmeterol (ADVAIR DISKUS) 250-50 MCG/DOSE AEPB Inhale 1 puff into the lungs 2 (two) times daily. 60 each 5  . ibuprofen (ADVIL,MOTRIN) 800 MG tablet TAKE 1 TABLET BY MOUTH EVERY 6-8 HOURS AS NEEDED FOR PAIN  2  . Multiple Vitamin (MULTIVITAMIN PO) Take 1 tablet by  mouth daily.      . traMADol (ULTRAM) 50 MG tablet Take 1 tablet (50 mg total) by mouth every 6 (six) hours as needed. 15 tablet 0   No current facility-administered medications on file prior to visit.    She is allergic to codeine and doxycycline..  Review of Systems Review of Systems  Constitutional: Negative for activity change, appetite change and fatigue.  HENT: Negative for hearing loss, congestion, tinnitus and ear discharge.  dentist q55m Eyes: Negative for visual disturbance (see optho q1y -- vision corrected to 20/20 with glasses).  Respiratory: Negative for cough, chest tightness and shortness of breath.   Cardiovascular: Negative for chest pain, palpitations and leg swelling.  Gastrointestinal: Negative for abdominal pain, diarrhea, constipation and abdominal distention.  Genitourinary: Negative for urgency, frequency, decreased urine volume and difficulty urinating.  Musculoskeletal: Negative for back pain, arthralgias and gait problem.  Skin: Negative for color change, pallor and rash.  Neurological: Negative for dizziness, light-headedness, numbness and headaches.  Hematological: Negative for adenopathy. Does not bruise/bleed easily.  Psychiatric/Behavioral: Negative for suicidal ideas, confusion, sleep disturbance, self-injury, dysphoric mood, decreased concentration and agitation.       Objective:    BP (!) 142/92 (BP Location: Right Arm, Patient Position: Sitting, Cuff Size: Normal)   Pulse 82   Temp 98.2 F (36.8 C) (Oral)   Resp 16   Ht 5\' 3"  (1.6 m)   Wt 173 lb (78.5 kg)   SpO2 98%   BMI 30.65 kg/m  General appearance: alert, cooperative, appears stated age and no distress Head: Normocephalic, without obvious abnormality, atraumatic Eyes: conjunctivae/corneas clear. PERRL, EOM's intact. Fundi benign. Ears: normal TM's and external ear canals both ears Nose: Nares normal. Septum midline. Mucosa normal. No drainage or sinus tenderness. Throat: lips,  mucosa, and tongue normal; teeth and gums normal Neck: no adenopathy, no carotid bruit, no JVD, supple, symmetrical, trachea midline and thyroid not enlarged, symmetric, no tenderness/mass/nodules Back: symmetric, no curvature. ROM normal. No CVA tenderness. Lungs: clear to auscultation bilaterally Breasts: normal appearance, no masses or tenderness Heart: regular rate and rhythm, S1, S2 normal, no murmur, click, rub or gallop Abdomen: soft, non-tender; bowel sounds normal; no masses,  no organomegaly Pelvic: not indicated; post-menopausal, no abnormal Pap smears in past Extremities: extremities normal, atraumatic, no cyanosis or edema Pulses: 2+ and symmetric Skin: Skin color, texture, turgor normal. No rashes or lesions Lymph nodes: Cervical, supraclavicular, and axillary nodes normal. Neurologic: Alert and oriented X 3, normal strength and tone. Normal symmetric reflexes. Normal coordination and gait    Assessment:    Healthy female exam.      Plan:    ghm utd Check labs  See After Visit  Summary for Counseling Recommendations    1. Preventative health care See above  - Lipid panel - CBC with Differential/Platelet - Comprehensive metabolic panel - Microalbumin / creatinine urine ratio  2. Essential hypertension Well controlled, no changes to meds. Encouraged heart healthy diet such as the DASH diet and exercise as tolerated.  - Lipid panel - CBC with Differential/Platelet - Comprehensive metabolic panel - Microalbumin / creatinine urine ratio - amLODipine (NORVASC) 5 MG tablet; Take 1 tablet (5 mg total) by mouth daily.  Dispense: 90 tablet; Refill: 1 - furosemide (LASIX) 20 MG tablet; Take 1 tablet (20 mg total) by mouth daily.  Dispense: 90 tablet; Refill: 1  3. Major depressive disorder, single episode, mild (HCC) stable - buPROPion (WELLBUTRIN XL) 300 MG 24 hr tablet; 1 po qd  Dispense: 90 tablet; Refill: 1 - citalopram (CELEXA) 10 MG tablet; Take 1 tablet (10 mg  total) by mouth daily.  Dispense: 90 tablet; Refill: 1

## 2019-01-31 LAB — COMPREHENSIVE METABOLIC PANEL
ALT: 17 U/L (ref 0–35)
AST: 20 U/L (ref 0–37)
Albumin: 4.6 g/dL (ref 3.5–5.2)
Alkaline Phosphatase: 62 U/L (ref 39–117)
BUN: 14 mg/dL (ref 6–23)
CO2: 28 mEq/L (ref 19–32)
Calcium: 9.4 mg/dL (ref 8.4–10.5)
Chloride: 102 mEq/L (ref 96–112)
Creatinine, Ser: 0.77 mg/dL (ref 0.40–1.20)
GFR: 73.72 mL/min (ref >60.00)
Glucose, Bld: 108 mg/dL — ABNORMAL HIGH (ref 70–99)
Potassium: 4.9 mEq/L (ref 3.5–5.1)
Sodium: 138 mEq/L (ref 135–145)
Total Bilirubin: 0.6 mg/dL (ref 0.2–1.2)
Total Protein: 6.6 g/dL (ref 6.0–8.3)

## 2019-01-31 LAB — CBC WITH DIFFERENTIAL/PLATELET
Basophils Absolute: 0.1 10*3/uL (ref 0.0–0.1)
Basophils Relative: 0.6 % (ref 0.0–3.0)
Eosinophils Absolute: 0.1 10*3/uL (ref 0.0–0.7)
Eosinophils Relative: 1.5 % (ref 0.0–5.0)
HCT: 40 % (ref 36.0–46.0)
Hemoglobin: 13.5 g/dL (ref 12.0–15.0)
Lymphocytes Relative: 22.2 % (ref 12.0–46.0)
Lymphs Abs: 1.9 10*3/uL (ref 0.7–4.0)
MCHC: 33.9 g/dL (ref 30.0–36.0)
MCV: 100.9 fl — ABNORMAL HIGH (ref 78.0–100.0)
Monocytes Absolute: 0.7 10*3/uL (ref 0.1–1.0)
Monocytes Relative: 8 % (ref 3.0–12.0)
Neutro Abs: 5.8 10*3/uL (ref 1.4–7.7)
Neutrophils Relative %: 67.7 % (ref 43.0–77.0)
Platelets: 275 10*3/uL (ref 150.0–400.0)
RBC: 3.97 Mil/uL (ref 3.87–5.11)
RDW: 13 % (ref 11.5–15.5)
WBC: 8.5 10*3/uL (ref 4.0–10.5)

## 2019-01-31 LAB — LIPID PANEL
Cholesterol: 281 mg/dL — ABNORMAL HIGH (ref 0–200)
HDL: 131.8 mg/dL (ref >39.00)
LDL Cholesterol: 136 mg/dL — ABNORMAL HIGH (ref 0–99)
NonHDL: 149.68
Total CHOL/HDL Ratio: 2
Triglycerides: 70 mg/dL (ref 0.0–149.0)
VLDL: 14 mg/dL (ref 0.0–40.0)

## 2019-01-31 LAB — MICROALBUMIN / CREATININE URINE RATIO
Creatinine,U: 70.7 mg/dL
Microalb Creat Ratio: 1 mg/g (ref 0.0–30.0)
Microalb, Ur: <0.7 mg/dL (ref 0.0–1.9)

## 2019-03-02 ENCOUNTER — Ambulatory Visit (HOSPITAL_COMMUNITY)
Admission: EM | Admit: 2019-03-02 | Discharge: 2019-03-02 | Disposition: A | Discharge status: Home / Self Care | Payer: Medicare Other | Attending: Family Medicine | Admitting: Family Medicine

## 2019-03-02 ENCOUNTER — Encounter (HOSPITAL_COMMUNITY): Payer: Self-pay

## 2019-03-02 ENCOUNTER — Ambulatory Visit (INDEPENDENT_AMBULATORY_CARE_PROVIDER_SITE_OTHER): Payer: Medicare Other | Admitting: Orthopaedic Surgery

## 2019-03-02 ENCOUNTER — Other Ambulatory Visit: Payer: Self-pay

## 2019-03-02 ENCOUNTER — Ambulatory Visit (INDEPENDENT_AMBULATORY_CARE_PROVIDER_SITE_OTHER): Payer: Medicare Other

## 2019-03-02 ENCOUNTER — Encounter: Payer: Self-pay | Admitting: Orthopaedic Surgery

## 2019-03-02 DIAGNOSIS — S62641A Nondisplaced fracture of proximal phalanx of left index finger, initial encounter for closed fracture: Secondary | ICD-10-CM

## 2019-03-02 DIAGNOSIS — S62611A Displaced fracture of proximal phalanx of left index finger, initial encounter for closed fracture: Secondary | ICD-10-CM | POA: Insufficient documentation

## 2019-03-02 NOTE — ED Triage Notes (Signed)
Pt presents with left hand injury after a fall last night while walking her dogs.  Pt left hand is very swollen and blue/purple discoloration.

## 2019-03-02 NOTE — Progress Notes (Signed)
Orthopedic Tech Progress Note Patient Details:  Samantha Davidson 27-Oct-1945 [id number]  Ortho Devices Type of Ortho Device: Arm sling, Volar splint Ortho Device/Splint Location: lue. applied splint as per drs verbal instructions. Ortho Device/Splint Interventions: Ordered, Application, Adjustment   Post Interventions Patient Tolerated: Well Instructions Provided: Care of device, Adjustment of device   Karolee Stamps 03/02/2019, 12:19 PM

## 2019-03-02 NOTE — Discharge Instructions (Signed)
CALL DR YATES OFFICE.  HE WANTS TO SEE YOU IN THE OFFICE THIS AFTERNOON TO SCHEDULE SURGERY

## 2019-03-02 NOTE — Progress Notes (Signed)
Office Visit Note   Patient: Samantha Davidson           Date of Birth: [birth year]           MRN: [id number] Visit Date: 03/02/2019              Requested by: [address] [place], [place] [address] [place],  [place] [address] PCP: Samantha Davidson, Samantha Apa, DO   Assessment & Plan: Visit Diagnoses:  1. Displaced fracture of proximal phalanx of left index finger, initial encounter for closed fracture     Plan: Patient has a closed  angulated proximal phalanx fracture left  index finger after a fall.  She will require close reduction percutaneous pinning stabilization of the angulated fracture.  We discussed problems with malrotation loss of fixation possibility of pin migration discussed.  Questions elicited and answered she understands request we proceed.  Indications for surgery discussed with patient in detail and decision for surgery made today.  Follow-Up Instructions: No follow-ups on file.   Orders:  No orders of the defined types were placed in this encounter.  No orders of the defined types were placed in this encounter.     Procedures: No procedures performed   Clinical Data: No additional findings.   Subjective: Chief Complaint  Patient presents with  . Left Hand - Fracture    DOI 03/01/2019    HPI 73 year old female was walking her dogs Yesterday and fell suffering injury to her left index finger.  She has small abrasion over her chin no other injuries.  No loss consciousness no head injury.  X-rays at [facility] urgent care demonstrated proximal phalanx fracture with angulation and displacement short oblique in the metaphyseal region. Review of Systems posture depression anxiety, hypertension, asthma.  History of colitis.  Otherwise negative is a pertains HPI.  Patient is followed by Dr. Carollee Davidson.   Objective: Vital Signs: Ht 5\' 4"  (1.626 m)   Wt 172 lb (78 kg)   BMI 29.52 kg/m   Physical Exam Constitutional:      Appearance: She  is well-developed.  HENT:     Head: Normocephalic.     Right Ear: External ear normal.     Left Ear: External ear normal.  Eyes:     Pupils: Pupils are equal, round, and reactive to light.  Neck:     Thyroid: No thyromegaly.     Trachea: No tracheal deviation.  Cardiovascular:     Rate and Rhythm: Normal rate.  Pulmonary:     Effort: Pulmonary effort is normal.  Abdominal:     Palpations: Abdomen is soft.  Skin:    General: Skin is warm and dry.  Neurological:     Mental Status: She is alert and oriented to person, place, and time.  Psychiatric:        Behavior: Behavior normal.     Ortho Exam small abrasion over chin no perioperative ecchymosis.  Extraocular movements intact.  Patient is in splint wrist immobilized MCPs flexed.  Sensation the fingertip is intact.  Specialty Comments:  No specialty comments available.  Imaging: Dg Hand Complete Left  Result Date: 03/02/2019 CLINICAL DATA:  Fall EXAM: LEFT HAND - COMPLETE 3+ VIEW COMPARISON:  None. FINDINGS: There is a fracture through the proximal aspect of the left index finger proximal phalanx, mildly comminuted and minimally displaced. No subluxation or dislocation. No additional acute bony abnormality. Joint space narrowing throughout the IP joints and MCP joints. No bony  erosions. IMPRESSION: Mildly comminuted and minimally displaced fracture through the proximal aspect of the left index finger proximal phalanx. Electronically Signed   By: Rolm Baptise M.D.   On: 03/02/2019 10:08     PMFS History: Patient Active Problem List   Diagnosis Date Noted  . Displaced fracture of proximal phalanx of left index finger, initial encounter for closed fracture 03/02/2019  . Pain in joint, pelvic region and thigh 12/16/2014  . Lower extremity pain 12/16/2014  . Pain in joint, ankle and foot 12/16/2014  . Abrasions of multiple sites 12/16/2014  . Pain in finger of right hand 05/29/2014  . Sciatica 02/25/2014  . Joint stiffness  02/25/2014  . Medicare annual wellness visit, subsequent 03/15/2012  . Personal history of adenomatous colonic polyps 02/29/2012  . Lymphocytic colitis 02/29/2012  . HTN (hypertension) 04/26/2011  . Depression with anxiety 04/26/2011  . Insomnia 04/26/2011  . Asthma 04/26/2011   Past Medical History:  Diagnosis Date  . Allergy   . Asthma   . Depression   . GERD (gastroesophageal reflux disease)   . History of DVT of lower extremity   . Hypertension   . Lymphocytic colitis   . Rectal polyp   . Tubular adenoma of colon     Family History  Problem Relation Age of Onset  . Lung cancer Mother   . Heart disease Mother   . Mental illness Mother   . Cancer Father   . Arthritis Maternal Grandmother     Past Surgical History:  Procedure Laterality Date  . COLONOSCOPY    . FOOT SURGERY  1988   pin in both great toes  . TONSILLECTOMY AND ADENOIDECTOMY     Social History   Occupational History  . Occupation: Art therapist: [employer]    Employer: [employer],  Tobacco Use  . Smoking status: Former Research scientist (life sciences)  . Smokeless tobacco: Never Used  . Tobacco comment: only when stressed  Substance and Sexual Activity  . Alcohol use: Yes    Alcohol/week: 0.0 standard drinks  . Drug use: No  . Sexual activity: Not on file

## 2019-03-02 NOTE — ED Provider Notes (Signed)
Samantha Davidson    CSN: [id number] Arrival date & time: 03/02/19  6283     History   Chief Complaint Chief Complaint  Patient presents with  . Hand Injury    Left    HPI Samantha Davidson is a 73 y.o. female.   HPI  Patient fell while walking her dog yesterday evening.  Has swelling and pain in her left hand.  She skinned her left knee has a small abrasion on her chin.  Otherwise well.  No head injury or loss of consciousness. Patient states that she is otherwise well.  Compliant with her medical care.  No other complaints. Her last bone density and 2017 did show osteoporosis/osteopenia Past Medical History:  Diagnosis Date  . Allergy   . Asthma   . Depression   . GERD (gastroesophageal reflux disease)   . History of DVT of lower extremity   . Hypertension   . Lymphocytic colitis   . Rectal polyp   . Tubular adenoma of colon     Patient Active Problem List   Diagnosis Date Noted  . Displaced fracture of proximal phalanx of left index finger, initial encounter for closed fracture 03/02/2019  . Pain in joint, pelvic region and thigh 12/16/2014  . Lower extremity pain 12/16/2014  . Pain in joint, ankle and foot 12/16/2014  . Abrasions of multiple sites 12/16/2014  . Pain in finger of right hand 05/29/2014  . Sciatica 02/25/2014  . Joint stiffness 02/25/2014  . Medicare annual wellness visit, subsequent 03/15/2012  . Personal history of adenomatous colonic polyps 02/29/2012  . Lymphocytic colitis 02/29/2012  . HTN (hypertension) 04/26/2011  . Depression with anxiety 04/26/2011  . Insomnia 04/26/2011  . Asthma 04/26/2011    Past Surgical History:  Procedure Laterality Date  . COLONOSCOPY    . FOOT SURGERY  1988   pin in both great toes  . TONSILLECTOMY AND ADENOIDECTOMY      OB History   No obstetric history on file.      Home Medications    Prior to Admission medications   Medication Sig Start Date End Date Taking? Authorizing Provider   acetaminophen (TYLENOL) 500 MG tablet Take 500 mg by mouth every 6 (six) hours as needed (for pain).     [provider]  amLODipine (NORVASC) 5 MG tablet Take 1 tablet (5 mg total) by mouth daily. 01/30/19   Ann Held, DO  aspirin 81 MG EC tablet Take 81 mg by mouth daily.      [provider]  buPROPion (WELLBUTRIN XL) 300 MG 24 hr tablet 1 po qd 01/30/19   Ann Held, DO  Cholecalciferol (VITAMIN D) 2000 UNITS CAPS Take 2 capsules by mouth daily.      [provider]  citalopram (CELEXA) 10 MG tablet Take 1 tablet (10 mg total) by mouth daily. 01/30/19   Ann Held, DO  EPINEPHrine (EPIPEN) 0.3 mg/0.3 mL DEVI Inject 0.3 mLs (0.3 mg total) into the muscle once. 05/17/11   Ann Held, DO  fexofenadine (ALLEGRA) 180 MG tablet Take 1 tablet (180 mg total) by mouth daily. 07/27/11   Ann Held, DO  Fluticasone-Salmeterol (ADVAIR DISKUS) 250-50 MCG/DOSE AEPB Inhale 1 puff into the lungs 2 (two) times daily. 04/06/16   Ann Held, DO  furosemide (LASIX) 20 MG tablet Take 1 tablet (20 mg total) by mouth daily. 01/30/19   Ann Held, DO  ibuprofen (ADVIL,MOTRIN)  800 MG tablet TAKE 1 TABLET BY MOUTH EVERY 6-8 HOURS AS NEEDED FOR PAIN 11/23/17   [provider]  Multiple Vitamin (MULTIVITAMIN PO) Take 1 tablet by mouth daily.      [provider]  traMADol (ULTRAM) 50 MG tablet Take 1 tablet (50 mg total) by mouth every 6 (six) hours as needed. 11/15/17   Drenda Freeze, MD    Family History Family History  Problem Relation Age of Onset  . Lung cancer Mother   . Heart disease Mother   . Mental illness Mother   . Cancer Father   . Arthritis Maternal Grandmother     Social History Social History   Tobacco Use  . Smoking status: Former Research scientist (life sciences)  . Smokeless tobacco: Never Used  . Tobacco comment: only when stressed  Substance Use Topics  . Alcohol use: Yes    Alcohol/week: 0.0  standard drinks  . Drug use: No     Allergies   Codeine and Doxycycline   Review of Systems Review of Systems  Constitutional: Negative for chills and fever.  HENT: Negative for ear pain and sore throat.   Eyes: Negative for pain and visual disturbance.  Respiratory: Negative for cough and shortness of breath.   Cardiovascular: Negative for chest pain and palpitations.  Gastrointestinal: Negative for abdominal pain and vomiting.  Genitourinary: Negative for dysuria and hematuria.  Musculoskeletal: Negative for arthralgias and back pain.       Left hand pain swelling and deformity  Skin: Negative for color change and rash.  Neurological: Negative for seizures and syncope.  All other systems reviewed and are negative.    Physical Exam Triage Vital Signs ED Triage Vitals  Enc Vitals Group     BP 03/02/19 0939 (!) 179/121     Pulse Rate 03/02/19 0939 70     Resp 03/02/19 0939 17     Temp 03/02/19 0939 98.2 F (36.8 C)     Temp Source 03/02/19 0939 Oral     SpO2 03/02/19 0939 100 %     Weight --      Height --      Head Circumference --      Peak Flow --      Pain Score 03/02/19 0940 7     Pain Loc --      Pain Edu? --      Excl. in [place]? --    No data found.  Updated Vital Signs BP (!) 179/121 (BP Location: Right Arm)   Pulse 70   Temp 98.2 F (36.8 C) (Oral)   Resp 17   SpO2 100%      Physical Exam Constitutional:      General: She is not in acute distress.    Appearance: She is well-developed.  HENT:     Head: Normocephalic and atraumatic.  Eyes:     Conjunctiva/sclera: Conjunctivae normal.     Pupils: Pupils are equal, round, and reactive to light.  Neck:     Musculoskeletal: Normal range of motion.  Cardiovascular:     Rate and Rhythm: Normal rate.  Pulmonary:     Effort: Pulmonary effort is normal. No respiratory distress.  Abdominal:     General: There is no distension.     Palpations: Abdomen is soft.  Musculoskeletal: Normal range of  motion.     Comments: Patient has arthritic changes in both hands with prominent knuckles and some ulnar deviation.  The left hand has deep purple ecchymosis and swelling  over the thumb index and long fingers proximally and in the metacarpals adjacent  Skin:    General: Skin is warm and dry.  Neurological:     Mental Status: She is alert.      UC Treatments / Results  Labs (all labs ordered are listed, but only abnormal results are displayed) Labs Reviewed - No data to display  EKG   Radiology Dg Hand Complete Left  Result Date: 03/02/2019 CLINICAL DATA:  Fall EXAM: LEFT HAND - COMPLETE 3+ VIEW COMPARISON:  None. FINDINGS: There is a fracture through the proximal aspect of the left index finger proximal phalanx, mildly comminuted and minimally displaced. No subluxation or dislocation. No additional acute bony abnormality. Joint space narrowing throughout the IP joints and MCP joints. No bony erosions. IMPRESSION: Mildly comminuted and minimally displaced fracture through the proximal aspect of the left index finger proximal phalanx. Electronically Signed   By: Rolm Baptise M.D.   On: 03/02/2019 10:08    Procedures Procedures (including critical care time)  Medications Ordered in UC Medications - No data to display  Initial Impression / Assessment and Plan / UC Course  I have reviewed the triage vital signs and the nursing notes.  Pertinent labs & imaging results that were available during my care of the patient were reviewed by me and considered in my medical decision making (see chart for details).     I spoke with Dr. Lorin Mercy.  He directed splint that he would like.  He states that the finger is going to need to be pinned and he will see the patient this afternoon when he is done in the OR. Final Clinical Impressions(s) / UC Diagnoses   Final diagnoses:  Closed nondisplaced fracture of proximal phalanx of left index finger, initial encounter     Discharge Instructions      CALL DR YATES OFFICE.  HE WANTS TO SEE YOU IN THE OFFICE THIS AFTERNOON TO SCHEDULE SURGERY    ED Prescriptions    None     Controlled Substance Prescriptions [place] Controlled Substance Registry consulted? Not Applicable   Raylene Everts, MD 03/02/19 2029

## 2019-03-05 ENCOUNTER — Ambulatory Visit: Admit: 2019-03-05 | Payer: Medicare Other | Admitting: Orthopaedic Surgery

## 2019-03-05 ENCOUNTER — Other Ambulatory Visit: Payer: Self-pay | Admitting: Orthopaedic Surgery

## 2019-03-05 DIAGNOSIS — S62611A Displaced fracture of proximal phalanx of left index finger, initial encounter for closed fracture: Secondary | ICD-10-CM | POA: Diagnosis not present

## 2019-03-05 SURGERY — CLOSED REDUCTION, FINGER, WITH PERCUTANEOUS PINNING
Anesthesia: Choice | Laterality: Left

## 2019-03-05 MED ORDER — HYDROCODONE-ACETAMINOPHEN 5-325 MG PO TABS: 1.0000 | ORAL_TABLET | ORAL | 0 refills | Status: DC | PRN

## 2019-03-05 NOTE — Progress Notes (Signed)
Post op Rx for pain

## 2019-03-13 ENCOUNTER — Ambulatory Visit: Payer: Medicare Other

## 2019-03-13 ENCOUNTER — Ambulatory Visit (INDEPENDENT_AMBULATORY_CARE_PROVIDER_SITE_OTHER): Payer: Medicare Other | Admitting: Orthopaedic Surgery

## 2019-03-13 DIAGNOSIS — S62611A Displaced fracture of proximal phalanx of left index finger, initial encounter for closed fracture: Secondary | ICD-10-CM

## 2019-03-13 DIAGNOSIS — S62611S Displaced fracture of proximal phalanx of left index finger, sequela: Secondary | ICD-10-CM | POA: Diagnosis not present

## 2019-03-13 DIAGNOSIS — M25642 Stiffness of left hand, not elsewhere classified: Secondary | ICD-10-CM | POA: Diagnosis not present

## 2019-03-13 DIAGNOSIS — M25542 Pain in joints of left hand: Secondary | ICD-10-CM | POA: Diagnosis not present

## 2019-03-13 DIAGNOSIS — M6281 Muscle weakness (generalized): Secondary | ICD-10-CM | POA: Diagnosis not present

## 2019-03-13 DIAGNOSIS — W19XXXS Unspecified fall, sequela: Secondary | ICD-10-CM | POA: Diagnosis not present

## 2019-03-13 NOTE — Progress Notes (Signed)
   Post-Op Visit Note   Patient: Samantha Davidson           Date of Birth: 1946-07-18           MRN: [id number] Visit Date: 03/13/2019 PCP: Ann Held, DO   Assessment & Plan: Postop left index finger proximal phalanx cross pinning.  Pin starting from the radial side distally and migrated slightly proximally through the cortex this pin was backed up 2 mm and 4 x 4 folded to prevent forward migration or proximal migration.  We will send her to hand therapy for custom splint MCP flex PIP straight they can work on some PIP range of motion.  I plan to recheck her in 3 weeks.  Repeat x-rays on return.  Chief Complaint:  Chief Complaint  Patient presents with  . Left Index Finger - Routine Post Op    03/05/2019 Closed reduction, percutaneous pinning left index finger   Visit Diagnoses:  1. Displaced fracture of proximal phalanx of left index finger, initial encounter for closed fracture     Plan: To hand therapy for custom splint.  Recheck 3 weeks repeat x-rays on return.  Follow-Up Instructions: No follow-ups on file.   Orders:  Orders Placed This Encounter  Procedures  . XR Finger Index Left   No orders of the defined types were placed in this encounter.   Imaging: No results found.  PMFS History: Patient Active Problem List   Diagnosis Date Noted  . Displaced fracture of proximal phalanx of left index finger, initial encounter for closed fracture 03/02/2019  . Pain in joint, pelvic region and thigh 12/16/2014  . Lower extremity pain 12/16/2014  . Pain in joint, ankle and foot 12/16/2014  . Abrasions of multiple sites 12/16/2014  . Pain in finger of right hand 05/29/2014  . Sciatica 02/25/2014  . Joint stiffness 02/25/2014  . Medicare annual wellness visit, subsequent 03/15/2012  . Personal history of adenomatous colonic polyps 02/29/2012  . Lymphocytic colitis 02/29/2012  . HTN (hypertension) 04/26/2011  . Depression with anxiety 04/26/2011  . Insomnia  04/26/2011  . Asthma 04/26/2011   Past Medical History:  Diagnosis Date  . Allergy   . Asthma   . Depression   . GERD (gastroesophageal reflux disease)   . History of DVT of lower extremity   . Hypertension   . Lymphocytic colitis   . Rectal polyp   . Tubular adenoma of colon     Family History  Problem Relation Age of Onset  . Lung cancer Mother   . Heart disease Mother   . Mental illness Mother   . Cancer Father   . Arthritis Maternal Grandmother     Past Surgical History:  Procedure Laterality Date  . COLONOSCOPY    . FOOT SURGERY  1988   pin in both great toes  . TONSILLECTOMY AND ADENOIDECTOMY     Social History   Occupational History  . Occupation: Art therapist: [employer]    Employer: [employer],  Tobacco Use  . Smoking status: Former Research scientist (life sciences)  . Smokeless tobacco: Never Used  . Tobacco comment: only when stressed  Substance and Sexual Activity  . Alcohol use: Yes    Alcohol/week: 0.0 standard drinks  . Drug use: No  . Sexual activity: Not on file

## 2019-03-15 ENCOUNTER — Telehealth: Payer: Self-pay | Admitting: Orthopaedic Surgery

## 2019-03-15 DIAGNOSIS — S62611S Displaced fracture of proximal phalanx of left index finger, sequela: Secondary | ICD-10-CM | POA: Diagnosis not present

## 2019-03-15 DIAGNOSIS — M6281 Muscle weakness (generalized): Secondary | ICD-10-CM | POA: Diagnosis not present

## 2019-03-15 DIAGNOSIS — M25642 Stiffness of left hand, not elsewhere classified: Secondary | ICD-10-CM | POA: Diagnosis not present

## 2019-03-15 DIAGNOSIS — W19XXXS Unspecified fall, sequela: Secondary | ICD-10-CM | POA: Diagnosis not present

## 2019-03-15 DIAGNOSIS — M25542 Pain in joints of left hand: Secondary | ICD-10-CM | POA: Diagnosis not present

## 2019-03-15 NOTE — Telephone Encounter (Signed)
Received call from Sam with [facility] needing a copy of the patient's BCBS card faxed to her. Patient was referred to them. The fax# is [phone]    The phone number is [phone]

## 2019-03-16 DIAGNOSIS — H25813 Combined forms of age-related cataract, bilateral: Secondary | ICD-10-CM | POA: Diagnosis not present

## 2019-03-16 DIAGNOSIS — H40013 Open angle with borderline findings, low risk, bilateral: Secondary | ICD-10-CM | POA: Diagnosis not present

## 2019-03-16 DIAGNOSIS — H52223 Regular astigmatism, bilateral: Secondary | ICD-10-CM | POA: Diagnosis not present

## 2019-03-16 DIAGNOSIS — H43813 Vitreous degeneration, bilateral: Secondary | ICD-10-CM | POA: Diagnosis not present

## 2019-03-16 DIAGNOSIS — H524 Presbyopia: Secondary | ICD-10-CM | POA: Diagnosis not present

## 2019-03-16 DIAGNOSIS — H5213 Myopia, bilateral: Secondary | ICD-10-CM | POA: Diagnosis not present

## 2019-03-16 NOTE — Telephone Encounter (Signed)
faxed

## 2019-03-19 DIAGNOSIS — M6281 Muscle weakness (generalized): Secondary | ICD-10-CM | POA: Diagnosis not present

## 2019-03-19 DIAGNOSIS — M25642 Stiffness of left hand, not elsewhere classified: Secondary | ICD-10-CM | POA: Diagnosis not present

## 2019-03-19 DIAGNOSIS — S62611S Displaced fracture of proximal phalanx of left index finger, sequela: Secondary | ICD-10-CM | POA: Diagnosis not present

## 2019-03-19 DIAGNOSIS — M25542 Pain in joints of left hand: Secondary | ICD-10-CM | POA: Diagnosis not present

## 2019-03-19 DIAGNOSIS — W19XXXS Unspecified fall, sequela: Secondary | ICD-10-CM | POA: Diagnosis not present

## 2019-03-21 DIAGNOSIS — M6281 Muscle weakness (generalized): Secondary | ICD-10-CM | POA: Diagnosis not present

## 2019-03-21 DIAGNOSIS — M25542 Pain in joints of left hand: Secondary | ICD-10-CM | POA: Diagnosis not present

## 2019-03-21 DIAGNOSIS — M25642 Stiffness of left hand, not elsewhere classified: Secondary | ICD-10-CM | POA: Diagnosis not present

## 2019-03-21 DIAGNOSIS — W19XXXS Unspecified fall, sequela: Secondary | ICD-10-CM | POA: Diagnosis not present

## 2019-03-21 DIAGNOSIS — S62611S Displaced fracture of proximal phalanx of left index finger, sequela: Secondary | ICD-10-CM | POA: Diagnosis not present

## 2019-03-23 ENCOUNTER — Ambulatory Visit (INDEPENDENT_AMBULATORY_CARE_PROVIDER_SITE_OTHER): Payer: Medicare Other | Admitting: Orthopaedic Surgery

## 2019-03-23 ENCOUNTER — Encounter: Payer: Self-pay | Admitting: Orthopaedic Surgery

## 2019-03-23 ENCOUNTER — Telehealth: Payer: Self-pay | Admitting: *Deleted

## 2019-03-23 VITALS — BP 135/79 | HR 76 | Ht 64.0 in | Wt 172.0 lb

## 2019-03-23 DIAGNOSIS — S62611A Displaced fracture of proximal phalanx of left index finger, initial encounter for closed fracture: Secondary | ICD-10-CM

## 2019-03-23 MED ORDER — CEPHALEXIN 500 MG PO CAPS: 500.0000 mg | ORAL_CAPSULE | Freq: Two times a day (BID) | ORAL | 0 refills | Status: DC

## 2019-03-23 NOTE — Telephone Encounter (Signed)
Samantha Davidson with [facility] called stating pt came in yesterday to do PT session and states the PIN site was Red and swollen, no pain, wants to know if you are going to call in antibiotic? Please return Samantha Davidson call at [phone]

## 2019-03-23 NOTE — Telephone Encounter (Signed)
I called coming in to get checked . She is on her way thanks. Let front desk know. Thank you

## 2019-03-23 NOTE — Progress Notes (Signed)
   Post-Op Visit Note   Patient: Samantha Davidson           Date of Birth: 01/26/1946           MRN: [id number] Visit Date: 03/23/2019 PCP: Ann Held, DO   Assessment & Plan: Probably 73 year old female now 17 days post pinning of left index finger proximal phalanx angulated fracture.  She is developed some redness around the pins her splint is been changed and it was rubbing or catching feeling tight the remainder splint and it was noted that the pins were red.  She does not have any purulent drainage we will start her on some Keflex recheck her in a week and repeat x-rays out of her splint on return left hand 3 views to look at the index finger fracture.  Chief Complaint:  Chief Complaint  Patient presents with  . Left Index Finger - Wound Check    03/05/2019 Closed reduction, percutaneous pinning left index finger   Visit Diagnoses: No diagnosis found.  Plan: Return 1 week x-rays as above.  Keflex 500 mg p.o. twice daily prescribed.  Follow-Up Instructions: Return in about 1 week (around 03/30/2019).   Orders:  No orders of the defined types were placed in this encounter.  No orders of the defined types were placed in this encounter.   Imaging: No results found.  PMFS History: Patient Active Problem List   Diagnosis Date Noted  . Displaced fracture of proximal phalanx of left index finger, initial encounter for closed fracture 03/02/2019  . Pain in joint, pelvic region and thigh 12/16/2014  . Lower extremity pain 12/16/2014  . Pain in joint, ankle and foot 12/16/2014  . Abrasions of multiple sites 12/16/2014  . Pain in finger of right hand 05/29/2014  . Sciatica 02/25/2014  . Joint stiffness 02/25/2014  . Medicare annual wellness visit, subsequent 03/15/2012  . Personal history of adenomatous colonic polyps 02/29/2012  . Lymphocytic colitis 02/29/2012  . HTN (hypertension) 04/26/2011  . Depression with anxiety 04/26/2011  . Insomnia 04/26/2011  .  Asthma 04/26/2011   Past Medical History:  Diagnosis Date  . Allergy   . Asthma   . Depression   . GERD (gastroesophageal reflux disease)   . History of DVT of lower extremity   . Hypertension   . Lymphocytic colitis   . Rectal polyp   . Tubular adenoma of colon     Family History  Problem Relation Age of Onset  . Lung cancer Mother   . Heart disease Mother   . Mental illness Mother   . Cancer Father   . Arthritis Maternal Grandmother     Past Surgical History:  Procedure Laterality Date  . COLONOSCOPY    . FOOT SURGERY  1988   pin in both great toes  . TONSILLECTOMY AND ADENOIDECTOMY     Social History   Occupational History  . Occupation: Art therapist: [employer]    Employer: [employer],  Tobacco Use  . Smoking status: Former Research scientist (life sciences)  . Smokeless tobacco: Never Used  . Tobacco comment: only when stressed  Substance and Sexual Activity  . Alcohol use: Yes    Alcohol/week: 0.0 standard drinks  . Drug use: No  . Sexual activity: Not on file

## 2019-03-23 NOTE — Telephone Encounter (Signed)
Please advise 

## 2019-03-23 NOTE — Telephone Encounter (Signed)
noted 

## 2019-03-29 DIAGNOSIS — W19XXXS Unspecified fall, sequela: Secondary | ICD-10-CM | POA: Diagnosis not present

## 2019-03-29 DIAGNOSIS — M6281 Muscle weakness (generalized): Secondary | ICD-10-CM | POA: Diagnosis not present

## 2019-03-29 DIAGNOSIS — M25642 Stiffness of left hand, not elsewhere classified: Secondary | ICD-10-CM | POA: Diagnosis not present

## 2019-03-29 DIAGNOSIS — M25542 Pain in joints of left hand: Secondary | ICD-10-CM | POA: Diagnosis not present

## 2019-03-29 DIAGNOSIS — S62611S Displaced fracture of proximal phalanx of left index finger, sequela: Secondary | ICD-10-CM | POA: Diagnosis not present

## 2019-03-30 ENCOUNTER — Ambulatory Visit (INDEPENDENT_AMBULATORY_CARE_PROVIDER_SITE_OTHER): Payer: Medicare Other | Admitting: Orthopaedic Surgery

## 2019-03-30 ENCOUNTER — Ambulatory Visit (INDEPENDENT_AMBULATORY_CARE_PROVIDER_SITE_OTHER): Payer: Medicare Other

## 2019-03-30 ENCOUNTER — Encounter: Payer: Self-pay | Admitting: Orthopaedic Surgery

## 2019-03-30 VITALS — BP 164/82 | HR 68 | Ht 64.0 in | Wt 172.0 lb

## 2019-03-30 DIAGNOSIS — S62611A Displaced fracture of proximal phalanx of left index finger, initial encounter for closed fracture: Secondary | ICD-10-CM | POA: Diagnosis not present

## 2019-03-30 NOTE — Progress Notes (Signed)
   Post-Op Visit Note   Patient: Samantha Davidson           Date of Birth: 1945-12-23           MRN: [id number] Visit Date: 03/30/2019 PCP: Ann Held, DO   Assessment & Plan: Remove dressing applied we discussed buddy taping index to long finger working on finger flexion no motion at the fracture site she is clinically healed.  Chief Complaint:  Chief Complaint  Patient presents with  . Left Hand - Follow-up    03/05/2019 Closed reduction, percutaneous pinning left index finger   Visit Diagnoses:  1. Displaced fracture of proximal phalanx of left index finger, initial encounter for closed fracture     Plan: Recheck 3 weeks.  No x-ray needed on return.  Follow-Up Instructions: Return in about 3 weeks (around 04/20/2019).   Orders:  Orders Placed This Encounter  Procedures  . XR Hand Complete Left   No orders of the defined types were placed in this encounter.   Imaging: No results found.  PMFS History: Patient Active Problem List   Diagnosis Date Noted  . Displaced fracture of proximal phalanx of left index finger, initial encounter for closed fracture 03/02/2019  . Pain in joint, pelvic region and thigh 12/16/2014  . Lower extremity pain 12/16/2014  . Pain in joint, ankle and foot 12/16/2014  . Abrasions of multiple sites 12/16/2014  . Pain in finger of right hand 05/29/2014  . Sciatica 02/25/2014  . Joint stiffness 02/25/2014  . Medicare annual wellness visit, subsequent 03/15/2012  . Personal history of adenomatous colonic polyps 02/29/2012  . Lymphocytic colitis 02/29/2012  . HTN (hypertension) 04/26/2011  . Depression with anxiety 04/26/2011  . Insomnia 04/26/2011  . Asthma 04/26/2011   Past Medical History:  Diagnosis Date  . Allergy   . Asthma   . Depression   . GERD (gastroesophageal reflux disease)   . History of DVT of lower extremity   . Hypertension   . Lymphocytic colitis   . Rectal polyp   . Tubular adenoma of colon      Family History  Problem Relation Age of Onset  . Lung cancer Mother   . Heart disease Mother   . Mental illness Mother   . Cancer Father   . Arthritis Maternal Grandmother     Past Surgical History:  Procedure Laterality Date  . COLONOSCOPY    . FOOT SURGERY  1988   pin in both great toes  . TONSILLECTOMY AND ADENOIDECTOMY     Social History   Occupational History  . 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 Smoking status: Former Research scientist (life sciences)  . Smokeless tobacco: Never Used  . Tobacco comment: only when stressed  Substance and Sexual Activity  . Alcohol use: Yes    Alcohol/week: 0.0 standard drinks  . Drug use: No  . Sexual activity: Not on file

## 2019-04-03 ENCOUNTER — Ambulatory Visit: Payer: Medicare Other | Admitting: Orthopaedic Surgery

## 2019-04-05 DIAGNOSIS — W19XXXS Unspecified fall, sequela: Secondary | ICD-10-CM | POA: Diagnosis not present

## 2019-04-05 DIAGNOSIS — M25642 Stiffness of left hand, not elsewhere classified: Secondary | ICD-10-CM | POA: Diagnosis not present

## 2019-04-05 DIAGNOSIS — S62611S Displaced fracture of proximal phalanx of left index finger, sequela: Secondary | ICD-10-CM | POA: Diagnosis not present

## 2019-04-05 DIAGNOSIS — M6281 Muscle weakness (generalized): Secondary | ICD-10-CM | POA: Diagnosis not present

## 2019-04-05 DIAGNOSIS — M25542 Pain in joints of left hand: Secondary | ICD-10-CM | POA: Diagnosis not present

## 2019-04-20 ENCOUNTER — Encounter: Payer: Self-pay | Admitting: Orthopaedic Surgery

## 2019-04-20 ENCOUNTER — Ambulatory Visit (INDEPENDENT_AMBULATORY_CARE_PROVIDER_SITE_OTHER): Payer: Medicare Other | Admitting: Orthopaedic Surgery

## 2019-04-20 VITALS — BP 202/90 | HR 80 | Ht 64.0 in | Wt 172.0 lb

## 2019-04-20 DIAGNOSIS — S62611A Displaced fracture of proximal phalanx of left index finger, initial encounter for closed fracture: Secondary | ICD-10-CM

## 2019-04-20 NOTE — Progress Notes (Signed)
Office Visit Note   Patient: Samantha Davidson           Date of Birth: 1946-01-31           MRN: [id number] Visit Date: 04/20/2019              Requested by: [address] [place], [place] [address] [place],  [place] [address] PCP: Carollee Herter, Alferd Apa, DO   Assessment & Plan: Visit Diagnoses:  1. Displaced fracture of proximal phalanx of left index finger, initial encounter for closed fracture     Plan: We went over passive flexion exercises of her fingertip keeping her wrist extended and pushing her fingertip down.  She is staying at [facility].  We went over multiple exercises to work on the flexion.  She will continue buddy taping to the long finger.  She is able to demonstrate appropriate flexion exercises and then can can continue to work on grip strengthening.  Follow-up PRN.  Follow-Up Instructions: Return if symptoms worsen or fail to improve.   Orders:  No orders of the defined types were placed in this encounter.  No orders of the defined types were placed in this encounter.     Procedures: No procedures performed   Clinical Data: No additional findings.   Subjective: Chief Complaint  Patient presents with  . Left Index Finger - Follow-up    03/05/2019 Closed reduction, percutaneous pinning left index finger    HPI postop right index finger proximal phalanx ORIF 03/05/2019 with pinning.  She lacks 2 cm touching fingertip to distal palmar crease.  Rotation looks good sensation the fingertip is good.  She is noted stiffness and some pain in the MCP joint.  Review of Systems unchanged   Objective: Vital Signs: BP (!) 202/90   Pulse 80   Ht 5\' 4"  (1.626 m)   Wt 172 lb (78 kg)   BMI 29.52 kg/m   Physical Exam unchanged  Ortho Exam patient has good DIP motion index finger.  Passive flexion worked on for 5 minutes and she got within 1 cm touching distal palmar crease.  Specialty Comments:  No specialty comments available.   Imaging: No results found.   PMFS History: Patient Active Problem List   Diagnosis Date Noted  . Displaced fracture of proximal phalanx of left index finger, initial encounter for closed fracture 03/02/2019  . Pain in joint, pelvic region and thigh 12/16/2014  . Lower extremity pain 12/16/2014  . Pain in joint, ankle and foot 12/16/2014  . Abrasions of multiple sites 12/16/2014  . Pain in finger of right hand 05/29/2014  . Sciatica 02/25/2014  . Joint stiffness 02/25/2014  . Medicare annual wellness visit, subsequent 03/15/2012  . Personal history of adenomatous colonic polyps 02/29/2012  . Lymphocytic colitis 02/29/2012  . HTN (hypertension) 04/26/2011  . Depression with anxiety 04/26/2011  . Insomnia 04/26/2011  . Asthma 04/26/2011   Past Medical History:  Diagnosis Date  . Allergy   . Asthma   . Depression   . GERD (gastroesophageal reflux disease)   . History of DVT of lower extremity   . Hypertension   . Lymphocytic colitis   . Rectal polyp   . Tubular adenoma of colon     Family History  Problem Relation Age of Onset  . Lung cancer Mother   . Heart disease Mother   . Mental illness Mother   . Cancer Father   . Arthritis Maternal Grandmother  Past Surgical History:  Procedure Laterality Date  . COLONOSCOPY    . FOOT SURGERY  1988   pin in both great toes  . TONSILLECTOMY AND ADENOIDECTOMY     Social History   Occupational History  . 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 Smoking status: Former Research scientist (life sciences)  . Smokeless tobacco: Never Used  . Tobacco comment: only when stressed  Substance and Sexual Activity  . Alcohol use: Yes    Alcohol/week: 0.0 standard drinks  . Drug use: No  . Sexual activity: Not on file

## 2020-12-18 IMAGING — DX LEFT HAND - COMPLETE 3+ VIEW
3 series · 3 of 3 positions shown · non-contrast
Comparison: None.

CLINICAL DATA: Fall

EXAM:
LEFT HAND - COMPLETE 3+ VIEW

[hand pa]
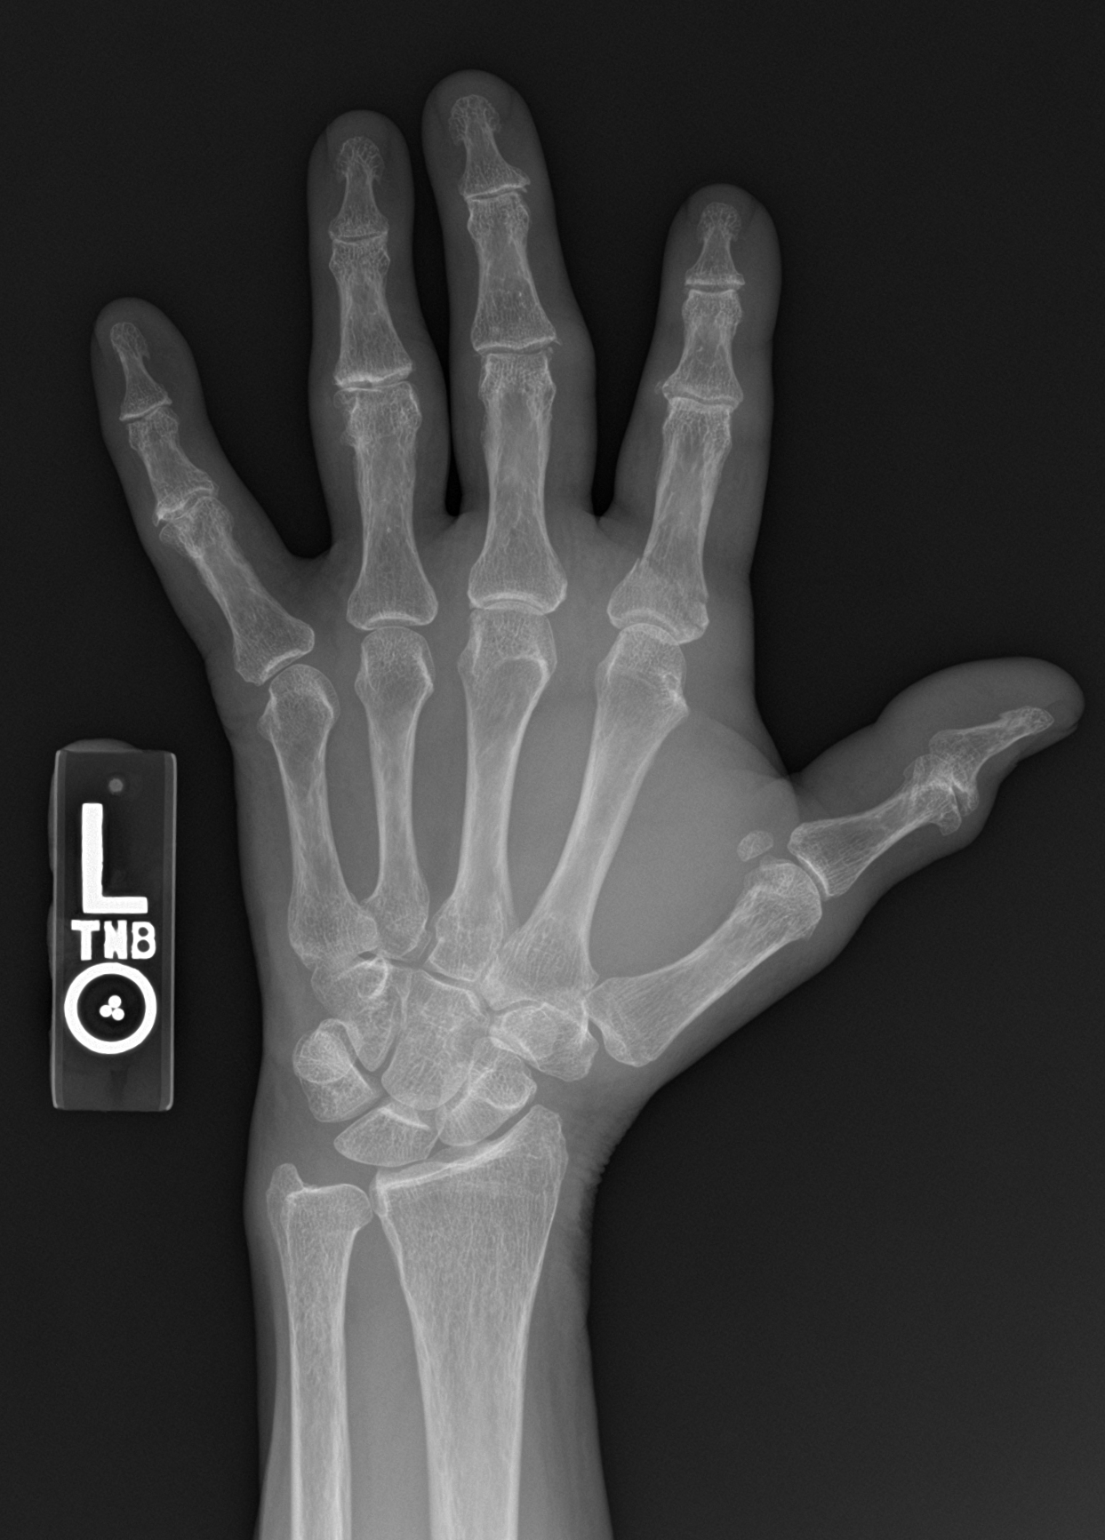

[hand obl]
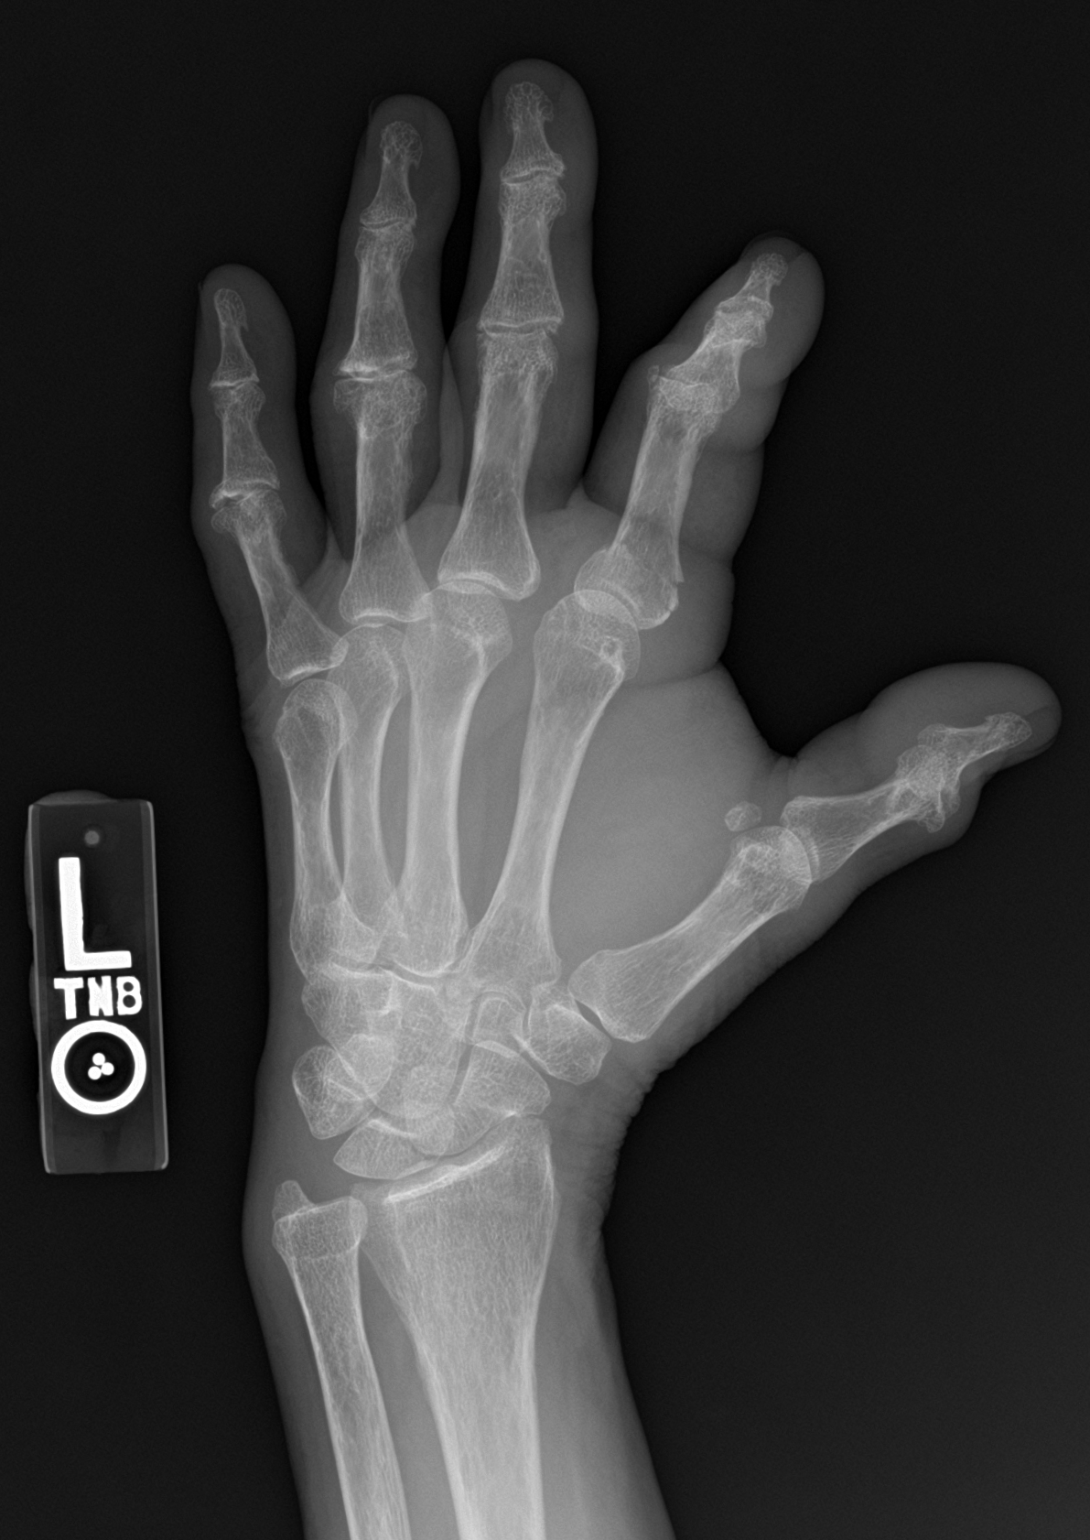

[hand lat]
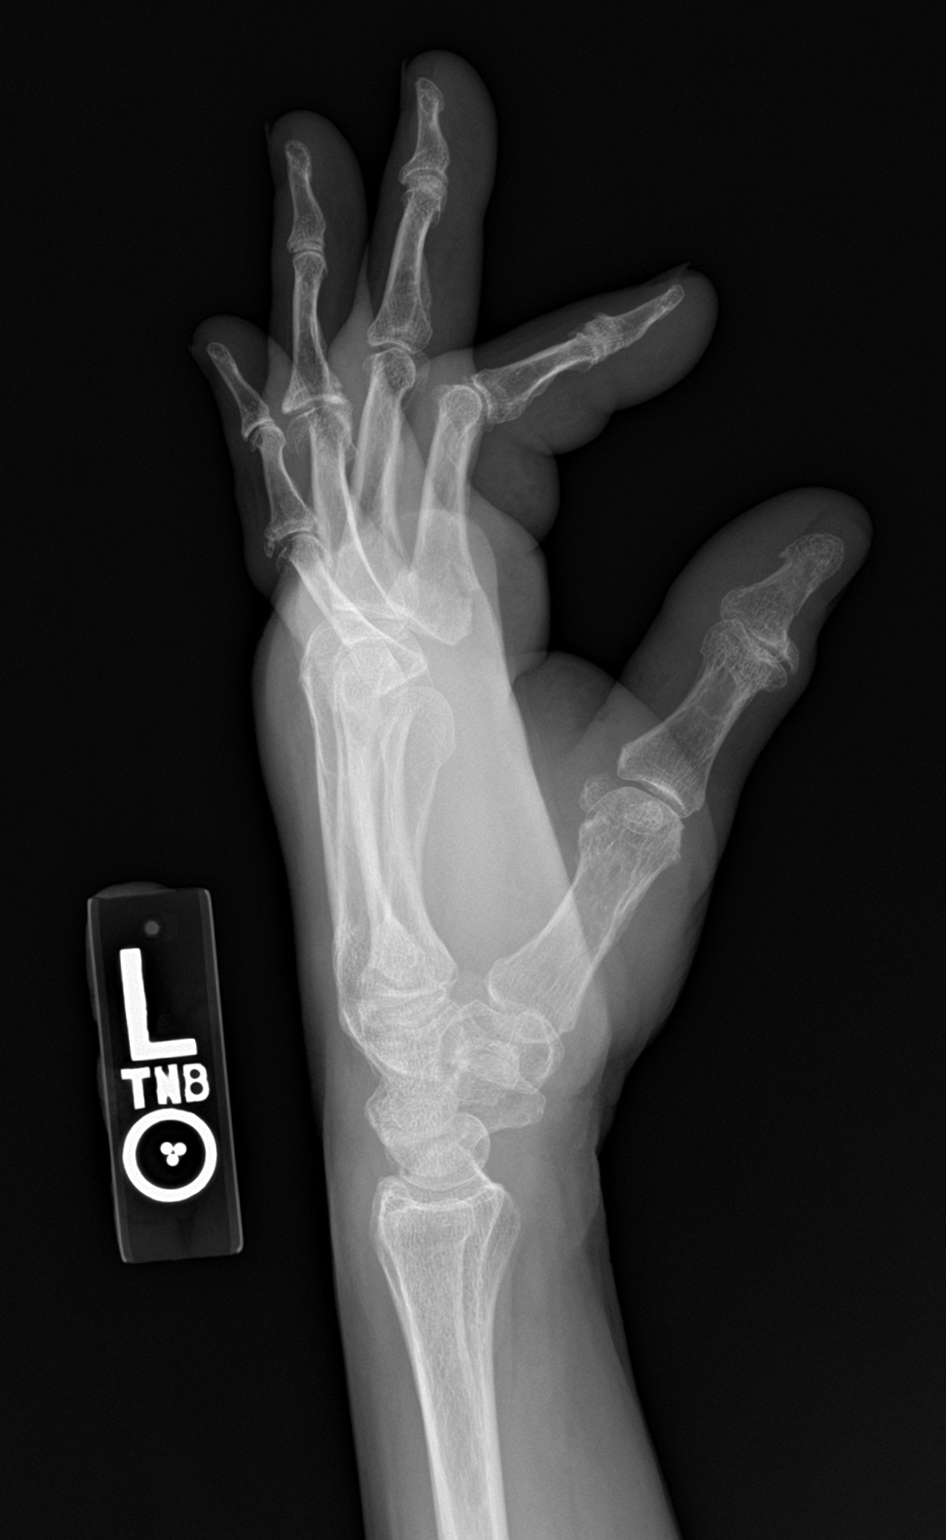

[3 of 3 positions shown; findings below may reference images not displayed]

FINDINGS: There is a fracture through the proximal aspect of the left index
finger proximal phalanx, mildly comminuted and minimally displaced.
No subluxation or dislocation. No additional acute bony abnormality.
Joint space narrowing throughout the IP joints and MCP joints. No
bony erosions.
IMPRESSION: Mildly comminuted and minimally displaced fracture through the
proximal aspect of the left index finger proximal phalanx.

## 2021-11-16 LAB — HM DEXA SCAN

## 2022-05-21 ENCOUNTER — Ambulatory Visit (INDEPENDENT_AMBULATORY_CARE_PROVIDER_SITE_OTHER): Payer: Medicare Other | Admitting: Nurse Practitioner

## 2022-05-21 ENCOUNTER — Encounter: Payer: Self-pay | Admitting: Nurse Practitioner

## 2022-05-21 VITALS — BP 136/90 | HR 75 | Temp 97.5°F | Ht 64.0 in | Wt 185.0 lb

## 2022-05-21 DIAGNOSIS — E041 Nontoxic single thyroid nodule: Secondary | ICD-10-CM | POA: Insufficient documentation

## 2022-05-21 DIAGNOSIS — M8000XD Age-related osteoporosis with current pathological fracture, unspecified site, subsequent encounter for fracture with routine healing: Secondary | ICD-10-CM

## 2022-05-21 DIAGNOSIS — S12101D Unspecified nondisplaced fracture of second cervical vertebra, subsequent encounter for fracture with routine healing: Secondary | ICD-10-CM | POA: Insufficient documentation

## 2022-05-21 DIAGNOSIS — Z8781 Personal history of (healed) traumatic fracture: Secondary | ICD-10-CM | POA: Insufficient documentation

## 2022-05-21 DIAGNOSIS — M810 Age-related osteoporosis without current pathological fracture: Secondary | ICD-10-CM | POA: Insufficient documentation

## 2022-05-21 DIAGNOSIS — Z23 Encounter for immunization: Secondary | ICD-10-CM | POA: Diagnosis not present

## 2022-05-21 DIAGNOSIS — R011 Cardiac murmur, unspecified: Secondary | ICD-10-CM | POA: Diagnosis not present

## 2022-05-21 DIAGNOSIS — I700 Atherosclerosis of aorta: Secondary | ICD-10-CM

## 2022-05-21 DIAGNOSIS — I10 Essential (primary) hypertension: Secondary | ICD-10-CM

## 2022-05-21 DIAGNOSIS — J4540 Moderate persistent asthma, uncomplicated: Secondary | ICD-10-CM

## 2022-05-21 DIAGNOSIS — F418 Other specified anxiety disorders: Secondary | ICD-10-CM

## 2022-05-21 DIAGNOSIS — M8000XA Age-related osteoporosis with current pathological fracture, unspecified site, initial encounter for fracture: Secondary | ICD-10-CM | POA: Insufficient documentation

## 2022-05-21 MED ORDER — OXYCODONE-ACETAMINOPHEN 5-325 MG PO TABS: 1.0000 | ORAL_TABLET | Freq: Four times a day (QID) | ORAL | 0 refills | Status: DC | PRN

## 2022-05-21 NOTE — Assessment & Plan Note (Signed)
She has a history of bilateral ankle fractures from a fall 5 months ago.  She declines a referral to orthopedics at this time.

## 2022-05-21 NOTE — Assessment & Plan Note (Signed)
She has a fracture of her C2 and C3 cervical spine after a fall on 05/03/2022.  She was hospitalized for this for a week and went to inpatient rehab for 8 days.  She currently moved to [place], [place] for her niece to help take care of her while she is healing.  She went and had a follow-up CT scan yesterday and met with the neurosurgeon, who recommended ongoing physical therapy and wearing the Aspen collar for 3 months.  She needs to establish with a neurosurgeon in [place], referral placed today.  With ongoing severe pain, will continue Percocet 5-3 25 every 6 hours as needed for pain.  PDMP reviewed.  Follow-up in 3 months or sooner with concerns.

## 2022-05-21 NOTE — Assessment & Plan Note (Signed)
Chronic, stable.  BP today 136/90.  Continue losartan 50 mg daily and HCTZ 25 mg daily.  Recent BMP, CBC reviewed.  Follow-up in 3 months.

## 2022-05-21 NOTE — Assessment & Plan Note (Signed)
Right thyroid nodule noted on CT angio of neck.  After she is able to remove her Aspen collar we will order a follow-up thyroid ultrasound.

## 2022-05-21 NOTE — Patient Instructions (Signed)
It was great to see you!  I have placed a referral to cardiology and neurosurgery.   I have refilled your pain medication.   Let's follow-up in 3 months, sooner if you have concerns.  If a referral was placed today, you will be contacted for an appointment. Please note that routine referrals can sometimes take up to 3-4 weeks to process. Please call our office if you haven't heard anything after this time frame.  Take care,  Vance Peper, NP

## 2022-05-21 NOTE — Assessment & Plan Note (Addendum)
She has a history of a cardiac murmur.  Echocardiogram on 08/27/2020 showed moderate aortic stenosis.  She was following with cardiology in [place] and would like a referral to cardiology to establish in [place].  Referral placed today.

## 2022-05-21 NOTE — Progress Notes (Signed)
New Patient Visit  BP (!) 136/90   Pulse 75   Temp (!) 97.5 F (36.4 C) (Temporal)   Ht _0  (1.626 m)   Wt 185 lb (83.9 kg)   SpO2 95%   PF (!) 1 L/min   BMI 31.76 kg/m    Subjective:    Patient ID: Samantha Davidson, female    DOB: [birth year], 76 y.o.   MRN: [id number]  CC: Chief Complaint  Patient presents with   Establish Care    Np. Est care. Pt recently had injury to neck due to fall. Requesting neurosurgeon referral and pain management.     HPI: Samantha Davidson is a 76 y.o. female presents for new patient visit to establish care.  Introduced to Designer, jewellery role and practice setting.  All questions answered.  Discussed provider/patient relationship and expectations.  Samantha [place] after her fall neck fracture.  She is living with her niece who is helping to care for her while she is healing.  On 05/03/22 she passed out while going down the stairs and woke up at the bottom of her steps. She went to the hospital and found out that she broke her C2 and C3. She was admitted in the hospital for 7 days, spent 8 days at an inpatient rehab. She had a new CT scan done 05/20/22 and met with the neurosurgeon yesterday in [place]. She has home health with RN and PT scheduled at home. She is still having severe pain in her neck and upper back. She was given a prescription for percocet, however was unable to get it filled. She is wearing a Clinical biochemist that she is wearing 24/7 and was told to wear this for 3 months.   She also has a history of bilateral ankle fractures she states from 5 months ago from a fall with her dogs.  She has some foot boots to wear, however they make her unsteady on her feet and hurts her neck so she has not been wearing them.  She has a history of high blood pressure and is currently taking losartan 50 mg daily and HCTZ 25 mg daily.  She denies chest pain and shortness of breath.  She has a  history of hyperlipidemia along with aortic atherosclerosis noted on imaging.  She is currently taking atorvastatin 40 mg daily and aspirin 81 mg daily.  She has a history of asthma that is currently well controlled.  She is currently using a Breo Ellipta inhaler, however she prefers Symbicort.  When she was in the hospital they gave her Breo Ellipta inhaler.  She uses albuterol as needed, however rarely uses this.  She has a history of osteoporosis which was noted on DEXA scan.  She is currently taking Fosamax 70 mg once a week and a vitamin D supplement daily.  She has a history of depression and is taking Celexa 20 mg daily and Wellbutrin 300 mg daily.  She states that this is well controlled.  She denies SI/HI.     05/21/2022    3:57 PM 04/06/2016    3:35 PM 04/01/2015    1:29 PM 09/21/2013    9:09 AM  Depression screen PHQ 2/9  Decreased Interest 2 0 0 0  Down, Depressed, Hopeless 0 0 0 0  PHQ - 2 Score 2 0 0 0  Altered sleeping 3     Tired, decreased energy 3     Change in appetite 2  Feeling bad or failure about yourself  2     Trouble concentrating 0     Moving slowly or fidgety/restless 0     Suicidal thoughts 0     PHQ-9 Score 12     Difficult doing work/chores Not difficult at all         05/21/2022    3:57 PM  GAD 7 : Generalized Anxiety Score  Nervous, Anxious, on Edge 1  Control/stop worrying 0  Worry too much - different things 1  Trouble relaxing 3  Restless 0  Easily annoyed or irritable 0  Afraid - awful might happen 0  Total GAD 7 Score 5  Anxiety Difficulty Not difficult at all    Past Medical History:  Diagnosis Date   Allergy    Asthma    Depression    GERD (gastroesophageal reflux disease)    History of DVT of lower extremity    Hypertension    Lymphocytic colitis    Osteoporosis    Rectal polyp    Tubular adenoma of colon     Past Surgical History:  Procedure Laterality Date   COLONOSCOPY     FOOT SURGERY  08/09/1986   pin in both  great toes   HAND SURGERY Left     Family History  Problem Relation Age of Onset   Diabetes Mother    Lung cancer Mother    Heart disease Mother    Mental illness Mother    Alzheimer's disease Mother    Cancer Father    Arthritis Maternal Grandmother      Social History   Tobacco Use   Smoking status: Former   Smokeless tobacco: Never   Tobacco comments:    only when stressed  Vaping Use   Vaping Use: Never used  Substance Use Topics   Alcohol use: Not Currently   Drug use: No    Current Outpatient Medications on File Prior to Visit  Medication Sig Dispense Refill   atorvastatin (LIPITOR) 40 MG tablet Take by mouth.     budesonide-formoterol (SYMBICORT) 80-4.5 MCG/ACT inhaler Inhale into the lungs.     albuterol (VENTOLIN HFA) 108 (90 Base) MCG/ACT inhaler SMARTSIG:2 Puff(s) By Mouth Every 4 Hours PRN     alendronate (FOSAMAX) 70 MG tablet Take 70 mg by mouth once a week.     aspirin 81 MG EC tablet Take 81 mg by mouth daily.       BREO ELLIPTA 100-25 MCG/ACT AEPB Inhale 1 puff into the lungs daily.     buPROPion (WELLBUTRIN XL) 300 MG 24 hr tablet 1 po qd 90 tablet 1   Cholecalciferol (VITAMIN D) 2000 UNITS CAPS Take 2 capsules by mouth daily.       citalopram (CELEXA) 10 MG tablet Take 1 tablet (10 mg total) by mouth daily. (Patient taking differently: Take 20 mg by mouth daily.) 90 tablet 1   EPINEPHrine (EPIPEN) 0.3 mg/0.3 mL DEVI Inject 0.3 mLs (0.3 mg total) into the muscle once. 1 Device 1   fexofenadine (ALLEGRA) 180 MG tablet Take 1 tablet (180 mg total) by mouth daily. 30 tablet 11   gabapentin (NEURONTIN) 300 MG capsule Take 300 mg by mouth 3 (three) times daily.     hydrochlorothiazide (HYDRODIURIL) 25 MG tablet Take 25 mg by mouth daily.     ibuprofen (ADVIL,MOTRIN) 800 MG tablet TAKE 1 TABLET BY MOUTH EVERY 6-8 HOURS AS NEEDED FOR PAIN  2   losartan (COZAAR) 25 MG tablet Take 50 mg  by mouth daily.     methocarbamol (ROBAXIN) 500 MG tablet Take 1,000 mg by  mouth 3 (three) times daily as needed.     Multiple Vitamin (MULTIVITAMIN PO) Take 1 tablet by mouth daily.       pantoprazole (PROTONIX) 40 MG tablet Take 40 mg by mouth 2 (two) times daily.     No current facility-administered medications on file prior to visit.     Review of Systems  Constitutional:  Positive for fatigue. Negative for fever.  HENT: Negative.    Eyes: Negative.   Respiratory: Negative.    Cardiovascular: Negative.   Gastrointestinal: Negative.   Genitourinary: Negative.   Musculoskeletal:  Positive for neck pain.  Skin: Negative.   Neurological: Negative.   Psychiatric/Behavioral: Negative.          Objective:    BP (!) 136/90   Pulse 75   Temp (!) 97.5 F (36.4 C) (Temporal)   Ht _0  (1.626 m)   Wt 185 lb (83.9 kg)   SpO2 95%   PF (!) 1 L/min   BMI 31.76 kg/m   Wt Readings from Last 3 Encounters:  05/21/22 185 lb (83.9 kg)  04/20/19 172 lb (78 kg)  03/30/19 172 lb (78 kg)    BP Readings from Last 3 Encounters:  05/21/22 (!) 136/90  04/20/19 (!) 202/90  03/30/19 (!) 164/82    Physical Exam Vitals and nursing note reviewed.  Constitutional:      General: She is not in acute distress.    Appearance: Normal appearance.  HENT:     Head: Normocephalic.  Eyes:     Conjunctiva/sclera: Conjunctivae normal.  Neck:     Comments: Wearing aspen collar Cardiovascular:     Rate and Rhythm: Normal rate and regular rhythm.     Pulses: Normal pulses.     Heart sounds: Murmur heard.  Pulmonary:     Effort: Pulmonary effort is normal.     Breath sounds: Normal breath sounds.  Musculoskeletal:     Cervical back: Normal range of motion.  Skin:    General: Skin is warm.  Neurological:     General: No focal deficit present.     Mental Status: She is alert and oriented to person, place, and time.  Psychiatric:        Mood and Affect: Mood normal.        Behavior: Behavior normal.        Thought Content: Thought content normal.        Judgment:  Judgment normal.       Assessment & Plan:   Problem List Items Addressed This Visit       Cardiovascular and Mediastinum   HTN (hypertension)    Chronic, stable.  BP today 136/90.  Continue losartan 50 mg daily and HCTZ 25 mg daily.  Recent BMP, CBC reviewed.  Follow-up in 3 months.      Relevant Medications   losartan (COZAAR) 25 MG tablet   hydrochlorothiazide (HYDRODIURIL) 25 MG tablet   atorvastatin (LIPITOR) 40 MG tablet   Aortic atherosclerosis (HCC)    Aortic atherosclerosis noted on CT scan on 05/04/2022.  Continue atorvastatin 40 mg daily and aspirin 81 mg daily.      Relevant Medications   losartan (COZAAR) 25 MG tablet   hydrochlorothiazide (HYDRODIURIL) 25 MG tablet   atorvastatin (LIPITOR) 40 MG tablet     Respiratory   Asthma    Chronic, stable.  Continue Breo Ellipta inhaler daily and albuterol  as needed.  Follow-up as symptoms worsen or any concerns.      Relevant Medications   BREO ELLIPTA 100-25 MCG/ACT AEPB   budesonide-formoterol (SYMBICORT) 80-4.5 MCG/ACT inhaler   albuterol (VENTOLIN HFA) 108 (90 Base) MCG/ACT inhaler     Endocrine   Thyroid nodule    Right thyroid nodule noted on CT angio of neck.  After she is able to remove her Aspen collar we will order a follow-up thyroid ultrasound.        Musculoskeletal and Integument   Closed nondisplaced fracture of second cervical vertebra with routine healing - Primary    She has a fracture of her C2 and C3 cervical spine after a fall on 05/03/2022.  She was hospitalized for this for a week and went to inpatient rehab for 8 days.  She currently moved to [place], [place] for her niece to help take care of her while she is healing.  She went and had a follow-up CT scan yesterday and met with the neurosurgeon, who recommended ongoing physical therapy and wearing the Aspen collar for 3 months.  She needs to establish with a neurosurgeon in [place], referral placed today.  With ongoing severe pain, will continue  Percocet 5-3 25 every 6 hours as needed for pain.  PDMP reviewed.  Follow-up in 3 months or sooner with concerns.      Relevant Orders   Ambulatory referral to Neurosurgery   Osteoporosis with current pathological fracture    DEXA scan 11/2021 showed a T score of -2.5 in her right hip.  Continue Fosamax 70 mg weekly and vitamin D supplement daily.  We will repeat DEXA scan in 2 years      Relevant Medications   alendronate (FOSAMAX) 70 MG tablet     Other   Depression with anxiety    Chronic, stable.  Continue Wellbutrin XL 300 mg daily and Celexa 20 mg daily.  She does not need any refills at this time.      History of ankle fracture    She has a history of bilateral ankle fractures from a fall 5 months ago.  She declines a referral to orthopedics at this time.      Murmur    She has a history of a cardiac murmur.  Echocardiogram on 08/27/2020 showed moderate aortic stenosis.  She was following with cardiology in [place] and would like a referral to cardiology to establish in [place].  Referral placed today.      Relevant Orders   Ambulatory referral to Cardiology   Other Visit Diagnoses     Need for influenza vaccination       Flu vaccine given today   Relevant Orders   Flu Vaccine QUAD High Dose(Fluad) (Completed)        Follow up plan: Return in about 3 months (around 08/21/2022) for follow-up.

## 2022-05-21 NOTE — Assessment & Plan Note (Signed)
Chronic, stable.  Continue Breo Ellipta inhaler daily and albuterol as needed.  Follow-up as symptoms worsen or any concerns.

## 2022-05-21 NOTE — Assessment & Plan Note (Signed)
Aortic atherosclerosis noted on CT scan on 05/04/2022.  Continue atorvastatin 40 mg daily and aspirin 81 mg daily.

## 2022-05-21 NOTE — Assessment & Plan Note (Addendum)
DEXA scan 11/2021 showed a T score of -2.5 in her right hip.  Continue Fosamax 70 mg weekly and vitamin D supplement daily.  We will repeat DEXA scan in 2 years

## 2022-05-21 NOTE — Assessment & Plan Note (Signed)
Chronic, stable.  Continue Wellbutrin XL 300 mg daily and Celexa 20 mg daily.  She does not need any refills at this time.

## 2022-05-26 ENCOUNTER — Telehealth: Payer: Self-pay | Admitting: Nurse Practitioner

## 2022-05-26 NOTE — Telephone Encounter (Signed)
[facility] [phone] ) called from [facility] and stated that request nursing med. And  nursing one a week for 8 weeks and pt has a fracture on her ankles

## 2022-05-27 NOTE — Telephone Encounter (Signed)
Per Ander Purpura, verbal orders given. [place] for pt to receive nursing care for 8 wks. Sw, cma

## 2022-06-08 ENCOUNTER — Telehealth: Payer: Self-pay | Admitting: Nurse Practitioner

## 2022-06-08 MED ORDER — OXYCODONE-ACETAMINOPHEN 5-325 MG PO TABS: 1.0000 | ORAL_TABLET | Freq: Three times a day (TID) | ORAL | 0 refills | Status: DC | PRN

## 2022-06-08 NOTE — Telephone Encounter (Signed)
Caller Name: pt  Call back phone #: [phone]   MEDICATION(S):  oxyCODONE-acetaminophen (PERCOCET/ROXICET) 5-325 MG tablet [[id number]]   Days of Med Remaining: 2 pills left Has the patient contacted their pharmacy (YES/NO)? Yes contact your PCP. She is having very bad headaches and she is seeing her neurologist in a couple of days.    [facility]erred Pharmacy: [id number] [place] [id number] Phone:  [phone] Fax:  [phone] DEA #:  [id number]

## 2022-06-09 NOTE — Telephone Encounter (Signed)
Called and pt confirmed she has appt 06/24/22 with neurosurgeon. Pt wants to know if she needs to see neuro specialist sooner. Advised pt will forward to pcp for further review. Sw, cma

## 2022-06-10 NOTE — Telephone Encounter (Signed)
Called and informed pt of provider recommendations. Sw, cma

## 2022-06-14 ENCOUNTER — Telehealth: Payer: Self-pay | Admitting: Nurse Practitioner

## 2022-06-14 NOTE — Telephone Encounter (Signed)
Samantha Davidson is calling from [facility] needing a cb at [phone], she and pt would  like to discuss an Designer, multimedia. Please advise at number above.

## 2022-06-15 NOTE — Telephone Encounter (Signed)
[facility] home health have orders  Home health OT once a wk for 2 wks and they are still waiting for a return call from you

## 2022-06-16 ENCOUNTER — Telehealth: Payer: Self-pay | Admitting: Nurse Practitioner

## 2022-06-16 NOTE — Telephone Encounter (Signed)
Called and spoke to [facility] from [facility] home health. Confirm per pcp pt will need to talk to neurosurgeon regarding neck collar concerns. Sw, cma

## 2022-06-16 NOTE — Telephone Encounter (Signed)
[name] from [facility] is calling in for pt, she has been dizzy since her fall in October. She is seeing her neurologist the 16th. I offered an app, she declined. She is wanting to know if there are any recommendations for her to handle her dizziness better. Please advise [name] at [phone]

## 2022-06-16 NOTE — Telephone Encounter (Signed)
Called home health aide and gave verbal orders. Following pt taking shower can she remove wet collar and immediately replace it with the dry Aspen collar? Pt wants to take wet collar off completely after showering and wants to make sure this is okay with pcp.

## 2022-06-18 MED ORDER — OXYCODONE-ACETAMINOPHEN 5-325 MG PO TABS: 1.0000 | ORAL_TABLET | Freq: Three times a day (TID) | ORAL | 0 refills | Status: DC | PRN

## 2022-06-18 NOTE — Telephone Encounter (Signed)
Pt has been notified of refill and recommendations made by provider

## 2022-06-18 NOTE — Addendum Note (Signed)
Addended by: Vance Peper A on: 06/18/2022 12:17 PM   Modules accepted: Orders

## 2022-06-18 NOTE — Telephone Encounter (Signed)
Spoke with pt she states that she is still having dizzy spells when she stands up to fast bends over or walks to fast. She states that her Bp is fine and that she is drinking plenty of water. Pt states that she is going out of town soon and would like a refill on her oxyCodone before she lives.  Please advise.

## 2022-06-23 DIAGNOSIS — Z86018 Personal history of other benign neoplasm: Secondary | ICD-10-CM

## 2022-06-23 DIAGNOSIS — F418 Other specified anxiety disorders: Secondary | ICD-10-CM | POA: Diagnosis not present

## 2022-06-23 DIAGNOSIS — E041 Nontoxic single thyroid nodule: Secondary | ICD-10-CM

## 2022-06-23 DIAGNOSIS — Z9181 History of falling: Secondary | ICD-10-CM

## 2022-06-23 DIAGNOSIS — Z7951 Long term (current) use of inhaled steroids: Secondary | ICD-10-CM

## 2022-06-23 DIAGNOSIS — Z86718 Personal history of other venous thrombosis and embolism: Secondary | ICD-10-CM

## 2022-06-23 DIAGNOSIS — M8008XD Age-related osteoporosis with current pathological fracture, vertebra(e), subsequent encounter for fracture with routine healing: Secondary | ICD-10-CM | POA: Diagnosis not present

## 2022-06-23 DIAGNOSIS — E785 Hyperlipidemia, unspecified: Secondary | ICD-10-CM

## 2022-06-23 DIAGNOSIS — I350 Nonrheumatic aortic (valve) stenosis: Secondary | ICD-10-CM

## 2022-06-23 DIAGNOSIS — K621 Rectal polyp: Secondary | ICD-10-CM

## 2022-06-23 DIAGNOSIS — K219 Gastro-esophageal reflux disease without esophagitis: Secondary | ICD-10-CM

## 2022-06-23 DIAGNOSIS — J45909 Unspecified asthma, uncomplicated: Secondary | ICD-10-CM | POA: Diagnosis not present

## 2022-06-23 DIAGNOSIS — I700 Atherosclerosis of aorta: Secondary | ICD-10-CM

## 2022-06-23 DIAGNOSIS — I10 Essential (primary) hypertension: Secondary | ICD-10-CM | POA: Diagnosis not present

## 2022-06-23 DIAGNOSIS — Z87891 Personal history of nicotine dependence: Secondary | ICD-10-CM

## 2022-06-23 NOTE — Telephone Encounter (Signed)
Samantha Davidson from [facility] is needing verbal orders to discontinue pt's treatment, she has moved to [place]. Samantha Davidson's secure voice mail # [phone].

## 2022-06-24 ENCOUNTER — Other Ambulatory Visit: Payer: Self-pay | Admitting: Neurosurgery

## 2022-06-24 DIAGNOSIS — S12101A Unspecified nondisplaced fracture of second cervical vertebra, initial encounter for closed fracture: Secondary | ICD-10-CM

## 2022-06-24 NOTE — Telephone Encounter (Signed)
[facility] has been notified that it is okay to discontinue home care for Mrs. Joyce-Jackson.

## 2022-07-06 ENCOUNTER — Ambulatory Visit
Admission: RE | Admit: 2022-07-06 | Discharge: 2022-07-06 | Disposition: A | Discharge status: Home / Self Care | Payer: Medicare Other | Source: Ambulatory Visit | Attending: Neurosurgery | Admitting: Neurosurgery

## 2022-07-06 DIAGNOSIS — S12101A Unspecified nondisplaced fracture of second cervical vertebra, initial encounter for closed fracture: Secondary | ICD-10-CM

## 2022-09-06 ENCOUNTER — Ambulatory Visit (INDEPENDENT_AMBULATORY_CARE_PROVIDER_SITE_OTHER): Payer: Medicare Other

## 2022-09-06 VITALS — Ht 63.0 in | Wt 164.0 lb

## 2022-09-06 DIAGNOSIS — Z0000 Encounter for general adult medical examination without abnormal findings: Secondary | ICD-10-CM | POA: Diagnosis not present

## 2022-09-06 NOTE — Progress Notes (Signed)
Virtual Visit via Telephone Note  I connected with  Samantha Davidson on 09/06/22 at  1:00 PM EST by telephone and verified that I am speaking with the correct person using two identifiers.  Location: Patient: home Provider: office Persons participating in the virtual visit: patient/Nurse Health Advisor   I discussed the limitations, risks, security and privacy concerns of performing an evaluation and management service by telephone and the availability of in person appointments. The patient expressed understanding and agreed to proceed.  Interactive audio and video telecommunications were attempted between this nurse and patient, however failed, due to patient having technical difficulties OR patient did not have access to video capability.  We continued and completed visit with audio only.  Some vital signs may be absent or patient reported.   Samantha Simmering, LPN  Subjective:   Samantha Davidson is a 77 y.o. female who presents for Medicare Annual (Subsequent) preventive examination.  Review of Systems     Cardiac Risk Factors include: advanced age (>40mn, >>17women);hypertension     Objective:    Today's Vitals   09/06/22 1300 09/06/22 1302  Weight: 164 lb (74.4 kg)   Height: '5\' 3"'$  (1.6 m)   PainSc:  3    Body mass index is 29.05 kg/m.     09/06/2022    1:14 PM 04/06/2016    3:55 PM 07/22/2014   10:23 PM  Advanced Directives  Does Patient Have a Medical Advance Directive? Yes Yes Yes  Type of AParamedicof ADickensLiving will [place] will [place] Does patient want to make changes to medical advance directive?  No - Patient declined   Copy of [place] Chart? No - copy requested No - copy requested     Current Medications (verified) Outpatient Encounter Medications as of 09/06/2022  Medication Sig   albuterol (VENTOLIN HFA) 108 (90 Base) MCG/ACT inhaler  SMARTSIG:2 Puff(s) By Mouth Every 4 Hours PRN   aspirin 81 MG EC tablet Take 81 mg by mouth daily.     atorvastatin (LIPITOR) 40 MG tablet Take by mouth.   budesonide-formoterol (SYMBICORT) 80-4.5 MCG/ACT inhaler Inhale into the lungs.   buPROPion (WELLBUTRIN XL) 300 MG 24 hr tablet 1 po qd   Cholecalciferol (VITAMIN D) 2000 UNITS CAPS Take 2 capsules by mouth daily.     citalopram (CELEXA) 10 MG tablet Take 1 tablet (10 mg total) by mouth daily. (Patient taking differently: Take 20 mg by mouth daily.)   EPINEPHrine (EPIPEN) 0.3 mg/0.3 mL DEVI Inject 0.3 mLs (0.3 mg total) into the muscle once.   fexofenadine (ALLEGRA) 180 MG tablet Take 1 tablet (180 mg total) by mouth daily.   hydrochlorothiazide (HYDRODIURIL) 25 MG tablet Take 25 mg by mouth daily.   ibuprofen (ADVIL,MOTRIN) 800 MG tablet TAKE 1 TABLET BY MOUTH EVERY 6-8 HOURS AS NEEDED FOR PAIN   Multiple Vitamin (MULTIVITAMIN PO) Take 1 tablet by mouth daily.     alendronate (FOSAMAX) 70 MG tablet Take 70 mg by mouth once a week. (Patient not taking: Reported on 09/06/2022)   BREO ELLIPTA 100-25 MCG/ACT AEPB Inhale 1 puff into the lungs daily. (Patient not taking: Reported on 09/06/2022)   gabapentin (NEURONTIN) 300 MG capsule Take 300 mg by mouth 3 (three) times daily. (Patient not taking: Reported on 09/06/2022)   losartan (COZAAR) 25 MG tablet Take 50 mg by mouth daily. (Patient not taking: Reported on 09/06/2022)   methocarbamol (ROBAXIN) 500 MG tablet  Take 1,000 mg by mouth 3 (three) times daily as needed. (Patient not taking: Reported on 09/06/2022)   oxyCODONE-acetaminophen (PERCOCET/ROXICET) 5-325 MG tablet Take 1 tablet by mouth every 8 (eight) hours as needed for severe pain. (Patient not taking: Reported on 09/06/2022)   pantoprazole (PROTONIX) 40 MG tablet Take 40 mg by mouth 2 (two) times daily. (Patient not taking: Reported on 09/06/2022)   No facility-administered encounter medications on file as of 09/06/2022.    Allergies  (verified) Codeine and Doxycycline   History: Past Medical History:  Diagnosis Date   Allergy    Asthma    Depression    GERD (gastroesophageal reflux disease)    History of DVT of lower extremity    Hypertension    Lymphocytic colitis    Osteoporosis    Rectal polyp    Tubular adenoma of colon    Past Surgical History:  Procedure Laterality Date   COLONOSCOPY     FOOT SURGERY  08/09/1986   pin in both great toes   HAND SURGERY Left    Family History  Problem Relation Age of Onset   Diabetes Mother    Lung cancer Mother    Heart disease Mother    Mental illness Mother    Alzheimer's disease Mother    Cancer Father    Arthritis Maternal Grandmother    Social History   Socioeconomic History   Marital status: Single    Spouse name: Not on file   Number of children: 2   Years of education: Not on file   Highest education level: Not on file  Occupational History   Occupation: interior Games developer: [employer]    Employer: [employer]  Tobacco Use   Smoking status: Former   Smokeless tobacco: Never   Tobacco comments:    only when stressed  Vaping Use   Vaping Use: Never used  Substance and Sexual Activity   Alcohol use: Not Currently    Comment: wine on occasion   Drug use: No   Sexual activity: Not Currently  Other Topics Concern   Not on file  Social History Narrative   Not on file   Social Determinants of Health   Financial Resource Strain: Low Risk  (09/06/2022)   Overall Financial Resource Strain (CARDIA)    Difficulty of Paying Living Expenses: Not hard at all  Food Insecurity: No Food Insecurity (09/06/2022)   Hunger Vital Sign    Worried About Running Out of Food in the Last Year: Never true    [place] in the Last Year: Never true  Transportation Needs: No Transportation Needs (09/06/2022)   PRAPARE - Hydrologist (Medical): No    Lack of Transportation (Non-Medical): No   Physical Activity: Inactive (09/06/2022)   Exercise Vital Sign    Days of Exercise per Week: 0 days    Minutes of Exercise per Session: 0 min  Stress: No Stress Concern Present (09/06/2022)   [place]    Feeling of Stress : Only a little  Social Connections: Not on file    Tobacco Counseling Counseling given: Not Answered Tobacco comments: only when stressed   Clinical Intake:  Pre-visit preparation completed: Yes  Pain : 0-10 Pain Score: 3  Pain Type: Acute pain Pain Location: Neck Pain Descriptors / Indicators: Aching Pain Onset: More than a month ago Pain Frequency: Intermittent Pain Relieving Factors: Tylenol  Pain Relieving Factors: Tylenol  Nutritional Risks: None Diabetes: No  How often do you need to have someone help you when you read instructions, pamphlets, or other written materials from your doctor or pharmacy?: 1 - Never  Diabetic?no  Interpreter Needed?: No  Information entered by :: C.McMullen LPN   Activities of Daily Living    09/06/2022    1:18 PM  In your present state of health, do you have any difficulty performing the following activities:  Hearing? 0  Vision? 1  Comment wears glasses  Difficulty concentrating or making decisions? 1  Comment Memory lapses since fall, Followed by Dr.Pool  Walking or climbing stairs? 0  Dressing or bathing? 0  Doing errands, shopping? 0  Preparing Food and eating ? N  Using the Toilet? N  In the past six months, have you accidently leaked urine? Y  Comment occasional  Do you have problems with loss of bowel control? N  Managing your Medications? N  Managing your Finances? N  Housekeeping or managing your Housekeeping? N    Patient Care Team: Charyl Dancer, NP as PCP - General (Internal Medicine) Gatha Mayer, MD as Consulting Physician (Gastroenterology) Calvert Cantor, MD as Consulting Physician (Ophthalmology)  Indicate  any recent Medical Services you may have received from other than [facility] providers in the past year (date may be approximate).     Assessment:   This is a routine wellness examination for Geraldina.  Hearing/Vision screen Vision Screening - Comments:: No regular eye exams  Dietary issues and exercise activities discussed: Exercise limited by: orthopedic condition(s)   Goals Addressed             This Visit's Progress    Patient Stated       09/06/2022 To sell home in [place], and purchase home in [place] to spend time with grandchildren.       Depression Screen    09/06/2022    1:17 PM 05/21/2022    3:57 PM 04/06/2016    3:35 PM 04/01/2015    1:29 PM 09/21/2013    9:09 AM  PHQ 2/9 Scores  PHQ - 2 Score 0 2 0 0 0  PHQ- 9 Score  12       Fall Risk    09/06/2022    1:15 PM 05/21/2022    3:15 PM 06/27/2018    9:45 AM 03/10/2018    9:14 AM 04/06/2016    3:35 PM  Fall Risk   Falls in the past year? 1 1 0 No Yes  Comment   Emmi Telephone Survey: data to providers prior to load Emmi Telephone Survey: data to providers prior to load When wearing flip flops  Number falls in past yr: 0    1  Injury with Fall? 1      Comment Neck FX      Risk for fall due to : History of fall(s);Medication side effect      Follow up Falls prevention discussed        FALL RISK PREVENTION PERTAINING TO THE HOME:  Any stairs in or around the home? Yes  If so, are there any without handrails? Yes  Home free of loose throw rugs in walkways, pet beds, electrical cords, etc? Yes  Adequate lighting in your home to reduce risk of falls? Yes   ASSISTIVE DEVICES UTILIZED TO PREVENT FALLS:  Life alert? No  Use of a cane, walker or w/c? No  Grab bars in the bathroom? Yes  Shower  chair or bench in shower? Yes  Elevated toilet seat or a handicapped toilet? Yes   TIMED UP AND GO:  Was the test performed? No .    Cognitive Function:    04/06/2016    3:57 PM  MMSE - Mini Mental State Exam   Orientation to time 5  Orientation to Place 5  Registration 3  Attention/ Calculation 5  Recall 3  Language- name 2 objects 2  Language- repeat 1  Language- follow 3 step command 3  Language- read & follow direction 1  Write a sentence 1  Copy design 1  Total score 30        09/06/2022    1:22 PM  6CIT Screen  What Year? 0 points  What month? 0 points  What time? 0 points  Count back from 20 0 points  Months in reverse 0 points  Repeat phrase 2 points  Total Score 2 points    Immunizations Immunization History  Administered Date(s) Administered   Fluad Quad(high Dose 65+) 05/21/2022   Influenza Split 06/07/2012   Influenza, High Dose Seasonal PF 06/02/2017   Influenza,inj,Quad PF,6+ Mos 09/21/2013   Influenza-Unspecified 06/02/2017, 06/09/2018   Pneumococcal Conjugate-13 04/06/2016   Pneumococcal Polysaccharide-23 06/07/2012, 01/30/2019   Tdap 12/23/2014    TDAP status: Up to date  Flu Vaccine status: Up to date  Pneumococcal vaccine status: Up to date  Covid-19 vaccine status: Declined, Education has been provided regarding the importance of this vaccine but patient still declined. Advised may receive this vaccine at local pharmacy or Health Dept.or vaccine clinic. Aware to provide a copy of the vaccination record if obtained from local pharmacy or Health Dept. Verbalized acceptance and understanding.  Qualifies for Shingles Vaccine? Yes   Zostavax completed No   Shingrix Completed?: No.    Education has been provided regarding the importance of this vaccine. Patient has been advised to call insurance company to determine out of pocket expense if they have not yet received this vaccine. Advised may also receive vaccine at local pharmacy or Health Dept. Verbalized acceptance and understanding.  Screening Tests Health Maintenance  Topic Date Due   COVID-19 Vaccine (1) Never done   Hepatitis C Screening  Never done   Zoster Vaccines- Shingrix (1 of 2)  09/08/2022 (Originally 03/23/1997)   COLONOSCOPY (Pts 45-3yr Insurance coverage will need to be confirmed)  05/22/2023 (Originally 02/22/2013)   Medicare Annual Wellness (AWV)  09/07/2023   DTaP/Tdap/Td (2 - Td or Tdap) 12/22/2024   Pneumonia Vaccine 76 Years old  Completed   INFLUENZA VACCINE  Completed   DEXA SCAN  Completed   HPV VACCINES  Aged Out    Health Maintenance  Health Maintenance Due  Topic Date Due   COVID-19 Vaccine (1) Never done   Hepatitis C Screening  Never done    Colorectal cancer screening: No longer required.   [facility]  Bone Density status: Completed 11/16/2021.   Lung Cancer Screening: (Low Dose CT Chest recommended if Age 76-80years, 30 pack-year currently smoking OR have quit w/in 15years.) does not qualify.   Lung Cancer Screening Referral: no  Additional Screening:  Hepatitis C Screening: does qualify; Completed   Vision Screening: Recommended annual ophthalmology exams for early detection of glaucoma and other disorders of the eye. Is the patient up to date with their annual eye exam?  No  Who is the provider or what is the name of the office in which  the patient attends annual eye exams? [facility] If pt is not established with a provider, would they like to be referred to a provider to establish care? No .   Dental Screening: Recommended annual dental exams for proper oral hygiene  Community Resource Referral / Chronic Care Management: CRR required this visit?  No   CCM required this visit?  No      Plan:     I have personally reviewed and noted the following in the patient's chart:   Medical and social history Use of alcohol, tobacco or illicit drugs  Current medications and supplements including opioid prescriptions. Patient is not currently taking opioid prescriptions. Functional ability and status Nutritional status Physical activity Advanced directives List of  other physicians Hospitalizations, surgeries, and ER visits in previous 12 months Vitals Screenings to include cognitive, depression, and falls Referrals and appointments  In addition, I have reviewed and discussed with patient certain preventive protocols, quality metrics, and best practice recommendations. A written personalized care plan for preventive services as well as general preventive health recommendations were provided to patient.     Samantha Simmering, LPN   6/80/3212   Nurse Notes: none  Due to this being a virtual visit, the after visit summary with patients personalized plan was offered to patient via mail or my-chart. Patient preferred to pick up at office at next visit

## 2022-09-06 NOTE — Patient Instructions (Signed)
[name] , Thank you for taking time to come for your Medicare Wellness Visit. I appreciate your ongoing commitment to your health goals. Please review the following plan we discussed and let me know if I can assist you in the future.   These are the goals we discussed:  Goals      Patient Stated     09/06/2022 To sell home in [place], and purchase home in [place] to spend time with grandchildren.        This is a list of the screening recommended for you and due dates:  Health Maintenance  Topic Date Due   COVID-19 Vaccine (1) Never done   Hepatitis C Screening: USPSTF Recommendation to screen - Ages 91-79 yo.  Never done   Zoster (Shingles) Vaccine (1 of 2) 09/08/2022*   Colon Cancer Screening  05/22/2023*   Medicare Annual Wellness Visit  09/07/2023   DTaP/Tdap/Td vaccine (2 - Td or Tdap) 12/22/2024   Pneumonia Vaccine  Completed   Flu Shot  Completed   DEXA scan (bone density measurement)  Completed   HPV Vaccine  Aged Out  *Topic was postponed. The date shown is not the original due date.    Advanced directives: Please bring a copy of your POA (Power of Attorney) and/or Living Will to your next appointment.    Conditions/risks identified: none  Next appointment: Follow up in one year for your annual wellness visit    Preventive Care 65 Years and Older, Female Preventive care refers to lifestyle choices and visits with your health care provider that can promote health and wellness. What does preventive care include? A yearly physical exam. This is also called an annual well check. Dental exams once or twice a year. Routine eye exams. Ask your health care provider how often you should have your eyes checked. Personal lifestyle choices, including: Daily care of your teeth and gums. Regular physical activity. Eating a healthy diet. Avoiding tobacco and drug use. Limiting alcohol use. Practicing safe sex. Taking low-dose aspirin every day. Taking vitamin and  mineral supplements as recommended by your health care provider. What happens during an annual well check? The services and screenings done by your health care provider during your annual well check will depend on your age, overall health, lifestyle risk factors, and family history of disease. Counseling  Your health care provider may ask you questions about your: Alcohol use. Tobacco use. Drug use. Emotional well-being. Home and relationship well-being. Sexual activity. Eating habits. History of falls. Memory and ability to understand (cognition). Work and work Statistician. Reproductive health. Screening  You may have the following tests or measurements: Height, weight, and BMI. Blood pressure. Lipid and cholesterol levels. These may be checked every 5 years, or more frequently if you are over 62 years old. Skin check. Lung cancer screening. You may have this screening every year starting at age 86 if you have a 30-pack-year history of smoking and currently smoke or have quit within the past 15 years. Fecal occult blood test (FOBT) of the stool. You may have this test every year starting at age 75. Flexible sigmoidoscopy or colonoscopy. You may have a sigmoidoscopy every 5 years or a colonoscopy every 10 years starting at age 21. Hepatitis C blood test. Hepatitis B blood test. Sexually transmitted disease (STD) testing. Diabetes screening. This is done by checking your blood sugar (glucose) after you have not eaten for a while (fasting). You may have this done every 1-3 years. Bone density scan. This  is done to screen for osteoporosis. You may have this done starting at age 40. Mammogram. This may be done every 1-2 years. Talk to your health care provider about how often you should have regular mammograms. Talk with your health care provider about your test results, treatment options, and if necessary, the need for more tests. Vaccines  Your health care provider may recommend  certain vaccines, such as: Influenza vaccine. This is recommended every year. Tetanus, diphtheria, and acellular pertussis (Tdap, Td) vaccine. You may need a Td booster every 10 years. Zoster vaccine. You may need this after age 87. Pneumococcal 13-valent conjugate (PCV13) vaccine. One dose is recommended after age 61. Pneumococcal polysaccharide (PPSV23) vaccine. One dose is recommended after age 38. Talk to your health care provider about which screenings and vaccines you need and how often you need them. This information is not intended to replace advice given to you by your health care provider. Make sure you discuss any questions you have with your health care provider. Document Released: 08/22/2015 Document Revised: 04/14/2016 Document Reviewed: 05/27/2015 Elsevier Interactive Patient Education  2017 [place] Prevention in the Home Falls can cause injuries. They can happen to people of all ages. There are many things you can do to make your home safe and to help prevent falls. What can I do on the outside of my home? Regularly fix the edges of walkways and driveways and fix any cracks. Remove anything that might make you trip as you walk through a door, such as a raised step or threshold. Trim any bushes or trees on the path to your home. Use bright outdoor lighting. Clear any walking paths of anything that might make someone trip, such as rocks or tools. Regularly check to see if handrails are loose or broken. Make sure that both sides of any steps have handrails. Any raised decks and porches should have guardrails on the edges. Have any leaves, snow, or ice cleared regularly. Use sand or salt on walking paths during winter. Clean up any spills in your garage right away. This includes oil or grease spills. What can I do in the bathroom? Use night lights. Install grab bars by the toilet and in the tub and shower. Do not use towel bars as grab bars. Use non-skid mats or  decals in the tub or shower. If you need to sit down in the shower, use a plastic, non-slip stool. Keep the floor dry. Clean up any water that spills on the floor as soon as it happens. Remove soap buildup in the tub or shower regularly. Attach bath mats securely with double-sided non-slip rug tape. Do not have throw rugs and other things on the floor that can make you trip. What can I do in the bedroom? Use night lights. Make sure that you have a light by your bed that is easy to reach. Do not use any sheets or blankets that are too big for your bed. They should not hang down onto the floor. Have a firm chair that has side arms. You can use this for support while you get dressed. Do not have throw rugs and other things on the floor that can make you trip. What can I do in the kitchen? Clean up any spills right away. Avoid walking on wet floors. Keep items that you use a lot in easy-to-reach places. If you need to reach something above you, use a strong step stool that has a grab bar. Keep electrical cords out  of the way. Do not use floor polish or wax that makes floors slippery. If you must use wax, use non-skid floor wax. Do not have throw rugs and other things on the floor that can make you trip. What can I do with my stairs? Do not leave any items on the stairs. Make sure that there are handrails on both sides of the stairs and use them. Fix handrails that are broken or loose. Make sure that handrails are as long as the stairways. Check any carpeting to make sure that it is firmly attached to the stairs. Fix any carpet that is loose or worn. Avoid having throw rugs at the top or bottom of the stairs. If you do have throw rugs, attach them to the floor with carpet tape. Make sure that you have a light switch at the top of the stairs and the bottom of the stairs. If you do not have them, ask someone to add them for you. What else can I do to help prevent falls? Wear shoes that: Do not  have high heels. Have rubber bottoms. Are comfortable and fit you well. Are closed at the toe. Do not wear sandals. If you use a stepladder: Make sure that it is fully opened. Do not climb a closed stepladder. Make sure that both sides of the stepladder are locked into place. Ask someone to hold it for you, if possible. Clearly mark and make sure that you can see: Any grab bars or handrails. First and last steps. Where the edge of each step is. Use tools that help you move around (mobility aids) if they are needed. These include: Canes. Walkers. Scooters. Crutches. Turn on the lights when you go into a dark area. Replace any light bulbs as soon as they burn out. Set up your furniture so you have a clear path. Avoid moving your furniture around. If any of your floors are uneven, fix them. If there are any pets around you, be aware of where they are. Review your medicines with your doctor. Some medicines can make you feel dizzy. This can increase your chance of falling. Ask your doctor what other things that you can do to help prevent falls. This information is not intended to replace advice given to you by your health care provider. Make sure you discuss any questions you have with your health care provider. Document Released: 05/22/2009 Document Revised: 01/01/2016 Document Reviewed: 08/30/2014 Elsevier Interactive Patient Education  2017 [employer].

## 2023-02-17 ENCOUNTER — Ambulatory Visit: Payer: Medicare Other | Admitting: Nurse Practitioner

## 2023-02-17 ENCOUNTER — Encounter: Payer: Self-pay | Admitting: Nurse Practitioner

## 2023-02-17 VITALS — BP 118/78 | HR 88 | Temp 98.2°F | Ht 63.0 in | Wt 179.8 lb

## 2023-02-17 DIAGNOSIS — E7800 Pure hypercholesterolemia, unspecified: Secondary | ICD-10-CM | POA: Diagnosis not present

## 2023-02-17 DIAGNOSIS — I700 Atherosclerosis of aorta: Secondary | ICD-10-CM

## 2023-02-17 DIAGNOSIS — I350 Nonrheumatic aortic (valve) stenosis: Secondary | ICD-10-CM

## 2023-02-17 DIAGNOSIS — J4540 Moderate persistent asthma, uncomplicated: Secondary | ICD-10-CM

## 2023-02-17 DIAGNOSIS — Z1159 Encounter for screening for other viral diseases: Secondary | ICD-10-CM

## 2023-02-17 DIAGNOSIS — M8000XD Age-related osteoporosis with current pathological fracture, unspecified site, subsequent encounter for fracture with routine healing: Secondary | ICD-10-CM

## 2023-02-17 DIAGNOSIS — E041 Nontoxic single thyroid nodule: Secondary | ICD-10-CM

## 2023-02-17 DIAGNOSIS — I10 Essential (primary) hypertension: Secondary | ICD-10-CM | POA: Diagnosis not present

## 2023-02-17 DIAGNOSIS — Z8781 Personal history of (healed) traumatic fracture: Secondary | ICD-10-CM | POA: Insufficient documentation

## 2023-02-17 MED ORDER — ALBUTEROL SULFATE HFA 108 (90 BASE) MCG/ACT IN AERS: 1.0000 | INHALATION_SPRAY | Freq: Four times a day (QID) | RESPIRATORY_TRACT | 6 refills | Status: DC | PRN

## 2023-02-17 MED ORDER — CHLORTHALIDONE 25 MG PO TABS: 25.0000 mg | ORAL_TABLET | Freq: Every day | ORAL | 3 refills | Status: DC

## 2023-02-17 MED ORDER — FLUTICASONE-SALMETEROL 100-50 MCG/ACT IN AEPB: 1.0000 | INHALATION_SPRAY | Freq: Two times a day (BID) | RESPIRATORY_TRACT | 6 refills | Status: DC

## 2023-02-17 MED ORDER — ALENDRONATE SODIUM 70 MG PO TABS: 70.0000 mg | ORAL_TABLET | ORAL | 3 refills | Status: AC

## 2023-02-17 MED ORDER — CITALOPRAM HYDROBROMIDE 20 MG PO TABS: 20.0000 mg | ORAL_TABLET | Freq: Every day | ORAL | 3 refills | Status: DC

## 2023-02-17 MED ORDER — VALSARTAN 40 MG PO TABS: 40.0000 mg | ORAL_TABLET | Freq: Every day | ORAL | 3 refills | Status: DC

## 2023-02-17 MED ORDER — FAMOTIDINE 40 MG PO TABS: 40.0000 mg | ORAL_TABLET | Freq: Every day | ORAL | 3 refills | Status: DC

## 2023-02-17 NOTE — Patient Instructions (Signed)
It was great to see you!  We are checking your labs today and will let you know the results via mychart/phone.   I have refilled your medicines  I placed a referral to cardiology  I ordered a thyroid ultrasound to follow-up on the nodule.   Let's follow-up in 6 months, sooner if you have concerns.  If a referral was placed today, you will be contacted for an appointment. Please note that routine referrals can sometimes take up to 3-4 weeks to process. Please call our office if you haven't heard anything after this time frame.  Take care,  Rodman Pickle, NP

## 2023-02-17 NOTE — Progress Notes (Signed)
Established Patient Office Visit  Subjective   Patient ID: Samantha Davidson, female    DOB: 07-06-1946  Age: 77 y.o. MRN: [id number]  Chief Complaint  Patient presents with   Referral Request    Referral for a cardiologist, request a letter for lasik eye surgery, Rx refills    HPI  Corrina Davidson is here to follow-up on her neck fracture and aortic stenosis.   She states that she is doing a lot better since her fall with cervical fracture. She is now out of her aspen collar. She has one more visit with neurosurgery and then she won't need to follow with them any more. She states that she still does have some pain in her neck, however it has improved. She states that her son did not want her to move back to [place] and be alone with no family close by, so she is selling that house and has moved to [place]. She states that she needs a referral to cardiology to get established here. She denies chest pain and shortness of breath. She is also planning on having cataracts removed and may need clearance for this procedure.     ROS See pertinent positives and negatives per HPI.    Objective:     BP 118/78 (BP Location: Right Arm)   Pulse 88   Temp 98.2 F (36.8 C)   Ht 5\' 3"  (1.6 m)   Wt 179 lb 12.8 oz (81.6 kg)   SpO2 97%   BMI 31.85 kg/m  BP Readings from Last 3 Encounters:  02/17/23 118/78  05/21/22 (!) 136/90  04/20/19 (!) 202/90   Wt Readings from Last 3 Encounters:  02/17/23 179 lb 12.8 oz (81.6 kg)  09/06/22 164 lb (74.4 kg)  05/21/22 185 lb (83.9 kg)      Physical Exam Vitals and nursing note reviewed.  Constitutional:      General: She is not in acute distress.    Appearance: Normal appearance.  HENT:     Head: Normocephalic.  Eyes:     Conjunctiva/sclera: Conjunctivae normal.  Cardiovascular:     Rate and Rhythm: Normal rate and regular rhythm.     Pulses: Normal pulses.     Heart sounds: Murmur heard.  Pulmonary:     Effort: Pulmonary  effort is normal.     Breath sounds: Normal breath sounds.  Musculoskeletal:     Cervical back: Normal range of motion and neck supple. No tenderness.  Lymphadenopathy:     Cervical: No cervical adenopathy.  Skin:    General: Skin is warm.  Neurological:     General: No focal deficit present.     Mental Status: She is alert and oriented to person, place, and time.  Psychiatric:        Mood and Affect: Mood normal.        Behavior: Behavior normal.        Thought Content: Thought content normal.        Judgment: Judgment normal.    The 10-year ASCVD risk score (Arnett DK, et al., 2019) is: 18.9%    Assessment & Plan:   Problem List Items Addressed This Visit       Cardiovascular and Mediastinum   HTN (hypertension) - Primary    Chronic, stable. Continue chlorthalidone 25mg  daily and valsartan 40mg  daily. Refills sent to the pharmacy. Check CMP, CBC, and lipid panel today. Follow-up in 6 months.       Relevant Medications   chlorthalidone (HYGROTON) 25  MG tablet   valsartan (DIOVAN) 40 MG tablet   atorvastatin (LIPITOR) 80 MG tablet   Aortic atherosclerosis (HCC)    Chronic, stable. Continue atorvastatin 40 mg daily and aspirin 81mg  daily. Check lipid panel today and adjust regimen based on results.       Relevant Medications   chlorthalidone (HYGROTON) 25 MG tablet   valsartan (DIOVAN) 40 MG tablet   atorvastatin (LIPITOR) 80 MG tablet   Other Relevant Orders   Ambulatory referral to Cardiology   Aortic valve stenosis    She states that she is almost due for an echocardiogram. Referral placed to cardiology.       Relevant Medications   chlorthalidone (HYGROTON) 25 MG tablet   valsartan (DIOVAN) 40 MG tablet   atorvastatin (LIPITOR) 80 MG tablet   Other Relevant Orders   Ambulatory referral to Cardiology     Respiratory   Asthma    Chronic, stable.  Continue Breo Ellipta inhaler daily and albuterol as needed.  Follow-up as symptoms worsen or any concerns.       Relevant Medications   albuterol (VENTOLIN HFA) 108 (90 Base) MCG/ACT inhaler   fluticasone-salmeterol (ADVAIR) 100-50 MCG/ACT AEPB     Endocrine   Thyroid nodule    Right thyroid nodule noted on CT angio of neck. Now that her aspen collar has been removed, will order thyroid ultrasound for further evaluation.       Relevant Orders   TSH (Completed)   US THYROID     Musculoskeletal and Integument   Osteoporosis with current pathological fracture    Continue fosamax 70mg  weekly. Refill sent to the pharmacy.       Relevant Medications   alendronate (FOSAMAX) 70 MG tablet   History of cervical fracture    She is healing well and now only has some mild pain. She is still following with neurosurgery. Aspen collar has been removed and she finished PT.         Other   Pure hypercholesterolemia    Chronic, stable. Continue atorvastatin 40mg  daily. Check CMP, CBC, lipid panel today and adjust regimen based on results.       Relevant Medications   chlorthalidone (HYGROTON) 25 MG tablet   valsartan (DIOVAN) 40 MG tablet   atorvastatin (LIPITOR) 80 MG tablet   Other Relevant Orders   CBC with Differential/Platelet (Completed)   Comprehensive metabolic panel (Completed)   Lipid panel (Completed)   Other Visit Diagnoses     Encounter for hepatitis C screening test for low risk patient       Screen hepatiits C today.   Relevant Orders   Hepatitis C antibody (Completed)       Return in about 6 months (around 08/20/2023) for HLD, thyroid nodule.    Gerre Scull, NP

## 2023-02-18 LAB — CBC WITH DIFFERENTIAL/PLATELET
Basophils Absolute: 0 10*3/uL (ref 0.0–0.1)
Basophils Relative: 0 % (ref 0.0–3.0)
Eosinophils Absolute: 0.3 10*3/uL (ref 0.0–0.7)
Eosinophils Relative: 4.5 % (ref 0.0–5.0)
HCT: 37.3 % (ref 36.0–46.0)
Hemoglobin: 12.3 g/dL (ref 12.0–15.0)
Lymphocytes Relative: 45.4 % (ref 12.0–46.0)
Lymphs Abs: 2.8 10*3/uL (ref 0.7–4.0)
MCHC: 33.1 g/dL (ref 30.0–36.0)
MCV: 100.1 fl — ABNORMAL HIGH (ref 78.0–100.0)
Monocytes Absolute: 0.8 10*3/uL (ref 0.1–1.0)
Monocytes Relative: 13.8 % — ABNORMAL HIGH (ref 3.0–12.0)
Neutro Abs: 2.2 10*3/uL (ref 1.4–7.7)
Neutrophils Relative %: 36.3 % — ABNORMAL LOW (ref 43.0–77.0)
Platelets: 229 10*3/uL (ref 150.0–400.0)
RBC: 3.72 Mil/uL — ABNORMAL LOW (ref 3.87–5.11)
RDW: 13.2 % (ref 11.5–15.5)
WBC: 6.1 10*3/uL (ref 4.0–10.5)

## 2023-02-18 LAB — LIPID PANEL
Cholesterol: 278 mg/dL — ABNORMAL HIGH (ref 0–200)
HDL: 95 mg/dL (ref >39.00)
NonHDL: 182.51
Total CHOL/HDL Ratio: 3
Triglycerides: 226 mg/dL — ABNORMAL HIGH (ref 0.0–149.0)
VLDL: 45.2 mg/dL — ABNORMAL HIGH (ref 0.0–40.0)

## 2023-02-18 LAB — COMPREHENSIVE METABOLIC PANEL
ALT: 13 U/L (ref 0–35)
AST: 16 U/L (ref 0–37)
Albumin: 4.1 g/dL (ref 3.5–5.2)
Alkaline Phosphatase: 76 U/L (ref 39–117)
BUN: 20 mg/dL (ref 6–23)
CO2: 29 mEq/L (ref 19–32)
Calcium: 9.3 mg/dL (ref 8.4–10.5)
Chloride: 99 mEq/L (ref 96–112)
Creatinine, Ser: 1.05 mg/dL (ref 0.40–1.20)
GFR: 51.83 mL/min — ABNORMAL LOW (ref >60.00)
Glucose, Bld: 94 mg/dL (ref 70–99)
Potassium: 3.8 mEq/L (ref 3.5–5.1)
Sodium: 137 mEq/L (ref 135–145)
Total Bilirubin: 0.4 mg/dL (ref 0.2–1.2)
Total Protein: 6.5 g/dL (ref 6.0–8.3)

## 2023-02-18 LAB — TSH: TSH: 1.09 u[IU]/mL (ref 0.35–5.50)

## 2023-02-18 LAB — HEPATITIS C ANTIBODY: Hepatitis C Ab: NONREACTIVE

## 2023-02-18 LAB — LDL CHOLESTEROL, DIRECT: Direct LDL: 139 mg/dL

## 2023-02-18 MED ORDER — ATORVASTATIN CALCIUM 80 MG PO TABS: 80.0000 mg | ORAL_TABLET | Freq: Every day | ORAL | 3 refills | Status: DC

## 2023-02-19 NOTE — Assessment & Plan Note (Signed)
Continue fosamax 70mg  weekly. Refill sent to the pharmacy.

## 2023-02-19 NOTE — Assessment & Plan Note (Signed)
Chronic, stable. Continue atorvastatin 40mg  daily. Check CMP, CBC, lipid panel today and adjust regimen based on results.

## 2023-02-19 NOTE — Assessment & Plan Note (Signed)
Chronic, stable.  Continue Breo Ellipta inhaler daily and albuterol as needed.  Follow-up as symptoms worsen or any concerns. 

## 2023-02-19 NOTE — Assessment & Plan Note (Signed)
She is healing well and now only has some mild pain. She is still following with neurosurgery. Aspen collar has been removed and she finished PT.

## 2023-02-19 NOTE — Assessment & Plan Note (Signed)
Right thyroid nodule noted on CT angio of neck. Now that her aspen collar has been removed, will order thyroid ultrasound for further evaluation.

## 2023-02-19 NOTE — Assessment & Plan Note (Signed)
Chronic, stable. Continue atorvastatin 40 mg daily and aspirin 81mg  daily. Check lipid panel today and adjust regimen based on results.

## 2023-02-19 NOTE — Assessment & Plan Note (Signed)
She states that she is almost due for an echocardiogram. Referral placed to cardiology.

## 2023-02-19 NOTE — Assessment & Plan Note (Signed)
Chronic, stable. Continue chlorthalidone 25mg  daily and valsartan 40mg  daily. Refills sent to the pharmacy. Check CMP, CBC, and lipid panel today. Follow-up in 6 months.

## 2023-03-14 ENCOUNTER — Ambulatory Visit
Admission: RE | Admit: 2023-03-14 | Discharge: 2023-03-14 | Disposition: A | Discharge status: Home / Self Care | Payer: Medicare Other | Source: Ambulatory Visit | Attending: Nurse Practitioner | Admitting: Nurse Practitioner

## 2023-03-14 DIAGNOSIS — E041 Nontoxic single thyroid nodule: Secondary | ICD-10-CM

## 2023-05-12 ENCOUNTER — Ambulatory Visit: Payer: Medicare Other | Attending: Internal Medicine | Admitting: Internal Medicine

## 2023-05-12 ENCOUNTER — Encounter: Payer: Self-pay | Admitting: Internal Medicine

## 2023-05-12 VITALS — BP 116/68 | HR 84 | Ht 63.5 in | Wt 183.0 lb

## 2023-05-12 DIAGNOSIS — I6523 Occlusion and stenosis of bilateral carotid arteries: Secondary | ICD-10-CM

## 2023-05-12 DIAGNOSIS — I350 Nonrheumatic aortic (valve) stenosis: Secondary | ICD-10-CM

## 2023-05-12 DIAGNOSIS — I10 Essential (primary) hypertension: Secondary | ICD-10-CM

## 2023-05-12 DIAGNOSIS — R011 Cardiac murmur, unspecified: Secondary | ICD-10-CM | POA: Diagnosis present

## 2023-05-12 DIAGNOSIS — I700 Atherosclerosis of aorta: Secondary | ICD-10-CM

## 2023-05-12 MED ORDER — ATORVASTATIN CALCIUM 40 MG PO TABS: 40.0000 mg | ORAL_TABLET | Freq: Every day | ORAL | 3 refills | Status: DC

## 2023-05-12 MED ORDER — EZETIMIBE 10 MG PO TABS: 10.0000 mg | ORAL_TABLET | Freq: Every day | ORAL | 3 refills | Status: DC

## 2023-05-12 MED ORDER — EZETIMIBE 10 MG PO TABS: 10.0000 mg | ORAL_TABLET | Freq: Every day | ORAL | 3 refills | Status: AC

## 2023-05-12 NOTE — Progress Notes (Signed)
Cardiology Office Note:  .    Date:  05/12/2023  ID:  Samantha Davidson, DOB [birth year], MRN [id number] PCP: Gerre Scull, NP  [facility] Providers Cardiologist:  Christell Constant, MD     CC: AS follow up Consulted for the evaluation of aortic stenosis at the behest of [name]   History of Present Illness: .    Samantha Davidson is a 77 y.o. female with a history of bilateral carotid artery stenosis, moderate aortic stenosis (2022) , HTN and HLD who presents for evaluation after previously seeing an [facility] facility in [place] (Dr. Gerome Apley).  Discussed the use of AI scribe software for clinical note transcription with the patient, who gave verbal consent to proceed.  History of Present Illness         Patient notes that she is feeling much better bow.   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  In September of 23 she feel down the stairs and broke her neck.  She had a long rehab.  She is now back in [place]. Has had no chest pain, chest pressure, chest tightness, chest stinging  She is still recovering from her long hospitalization; she walks her dogs rarely for now and notes weight gain.  No shortness of breath, DOE only with asthma attacks.  No PND or orthopnea.  Notes weight gain but no, leg swelling , or abdominal swelling.  No syncope or near syncope . Notes  no palpitations or funny heart beats.     Patient reports prior cardiac testing including  - echo in 2022- moderate AS - EKG NSR today - CA Duplex- moderate bilateral disease  She was on atorvastatin 80 mg but decreased to 40 mg due to hair loss.  Relevant histories: .  Social - has two sons, one in [place] who is a hospitalist, one who is in [place], she is a widow ROS: As per HPI.   Physical Exam:    VS:  BP 116/68   Pulse 84   Ht 5' 3.5" (1.613 m)   Wt 183 lb (83 kg)   SpO2 95%   BMI 31.91  kg/m    Wt Readings from Last 3 Encounters:  05/12/23 183 lb (83 kg)  02/17/23 179 lb 12.8 oz (81.6 kg)  09/06/22 164 lb (74.4 kg)    Gen: no distress, obese female   Neck: No JVD, bilateral carotid bruit Cardiac: No Rubs or Gallops, hasrh systolci murmur with diminutive A2, RRR +1 radial pulses Respiratory: inspiratory wheezes bilaterally, normal effort, normal  respiratory rate GI: Soft, nontender, non-distended  MS: No  edema;  moves all extremities Integument: Skin feels warm Neuro:  At time of evaluation, alert and oriented to person/place/time/situation  Psych: Normal affect, patient feels well   ASSESSMENT AND PLAN: .    AS - Suspect severe - will get echo; she is at risk of frailty and if severe would send for TAVR eval - some sx are asthma related  HTN  - controlled on current therapy  CA Stenosis - repeat duplex; continue ASA  HLD Statin intolerance - will drop back to atorvastatin 40 mg due to hair loss, add zetia, LDL goal < 70 - labs at f/u visit  Three month follow up unless TAVR work up  [name], MD FASE [facility] Cardiologist [facility]  [address] [place], [address] [place], [place]  [address] [phone]  2:53 PM

## 2023-05-12 NOTE — Patient Instructions (Addendum)
Medication Instructions:  Your physician has recommended you make the following change in your medication:  TAKE: atorvastatin 40 mg by mouth once daily START: ezetimibe (Zetia) 10 mg by mouth once daily   *If you need a refill on your cardiac medications before your next appointment, please call your pharmacy*   Lab Work: SAME DAY AS 3 MONTH FOLLOW UP: fasting lipid panel and ALT (nothing to eat or drink 12 hours prior except water)  If you have labs (blood work) drawn today and your tests are completely normal, you will receive your results only by: MyChart Message (if you have MyChart) OR A paper copy in the mail If you have any lab test that is abnormal or we need to change your treatment, we will call you to review the results.   Testing/Procedures: Your physician has requested that you have an echocardiogram. Echocardiography is a painless test that uses sound waves to create images of your heart. It provides your doctor with information about the size and shape of your heart and how well your heart's chambers and valves are working. This procedure takes approximately one hour. There are no restrictions for this procedure. Please do NOT wear cologne, perfume, aftershave, or lotions (deodorant is allowed). Please arrive 15 minutes prior to your appointment time.  Your physician has requested that you have a carotid duplex. This test is an ultrasound of the carotid arteries in your neck. It looks at blood flow through these arteries that supply the brain with blood. Allow one hour for this exam. There are no restrictions or special instructions.     Follow-Up: At [facility], you and your health needs are our priority.  As part of our continuing mission to provide you with exceptional heart care, we have created designated Provider Care Teams.  These Care Teams include your primary Cardiologist (physician) and Advanced Practice Providers (APPs -  Physician Assistants and  Nurse Practitioners) who all work together to provide you with the care you need, when you need it.   Your next appointment:   3 month(s)  Provider:   Christell Constant, MD

## 2023-05-12 NOTE — Addendum Note (Signed)
Addended by: Macie Burows on: 05/12/2023 02:57 PM   Modules accepted: Orders

## 2023-06-02 ENCOUNTER — Telehealth: Payer: Self-pay | Admitting: Internal Medicine

## 2023-06-02 ENCOUNTER — Ambulatory Visit (HOSPITAL_BASED_OUTPATIENT_CLINIC_OR_DEPARTMENT_OTHER): Payer: Medicare Other

## 2023-06-02 ENCOUNTER — Ambulatory Visit (HOSPITAL_COMMUNITY)
Admission: RE | Admit: 2023-06-02 | Discharge: 2023-06-02 | Disposition: A | Discharge status: Home / Self Care | Payer: Medicare Other | Source: Ambulatory Visit | Attending: Internal Medicine | Admitting: Internal Medicine

## 2023-06-02 DIAGNOSIS — I6523 Occlusion and stenosis of bilateral carotid arteries: Secondary | ICD-10-CM | POA: Diagnosis present

## 2023-06-02 DIAGNOSIS — I350 Nonrheumatic aortic (valve) stenosis: Secondary | ICD-10-CM | POA: Diagnosis present

## 2023-06-02 DIAGNOSIS — R011 Cardiac murmur, unspecified: Secondary | ICD-10-CM | POA: Insufficient documentation

## 2023-06-02 LAB — ECHOCARDIOGRAM COMPLETE
AR max vel: 1.03 cm2
AV Area VTI: 1.07 cm2
AV Area mean vel: 1.03 cm2
AV Mean grad: 39 mm[Hg]
AV Peak grad: 69.2 mm[Hg]
Ao pk vel: 4.16 m/s
Area-P 1/2: 2.23 cm2
S' Lateral: 2.1 cm

## 2023-06-02 NOTE — Telephone Encounter (Signed)
   Pre-operative Risk Assessment    Patient Name: Samantha Davidson  DOB: 01-Nov-1945 MRN: [id number]   Last office visit 05/12/23 with Dr. Kenn File  Upcoming 06/07/23 with Dr. Jacques Navy   Request for Surgical Clearance    Procedure:  Cataract Surgery   Date of Surgery:  Clearance 06/28/23                                 Surgeon:  not listed  Surgeon's Group or Practice Name:  [facility]  Phone number:  [phone] Fax number:  [phone]   Type of Clearance Requested:   - Pharmacy:  Hold Aspirin     Type of Anesthesia:  Not Indicated   Additional requests/questions:    Scarlette Shorts   06/02/2023, 3:36 PM

## 2023-06-02 NOTE — Telephone Encounter (Signed)
Pt came for echo, came by and wanted to express her dizzy spells. Please call and advise. Her cataract surgery was postponed due to dizzy spells.

## 2023-06-02 NOTE — Telephone Encounter (Signed)
Pt dropped off [facility] clearance form. Sending fax. Original copy in pre-op folder near fax machine in check out room.

## 2023-06-02 NOTE — Telephone Encounter (Signed)
   Patient Name: Samantha Davidson  DOB: [birth year] MRN: [id number]  Primary Cardiologist: Christell Constant, MD  Chart reviewed as part of pre-operative protocol coverage. Cataract extractions are recognized in guidelines as low risk surgeries that do not typically require specific preoperative testing or holding of blood thinner therapy. Therefore, given past medical history and time since last visit, based on ACC/AHA guidelines, Samantha Davidson would be at acceptable risk for the planned procedure without further cardiovascular testing.   I will route this recommendation to the requesting party via Epic fax function and remove from pre-op pool.  Please call with questions.  Levi Aland, NP-C  06/02/2023, 3:46 PM [address], [address] Office [phone] Fax [phone]

## 2023-06-02 NOTE — Telephone Encounter (Signed)
Called pt in regards to dizzy spells. Reports dizzy spells started about a month ago.  Occur when stands quickly or bends over to pick something up.   Reports cataract surgery was postponed d/t dizzy spells.  Surgery previously scheduled for 05/24/23.   Reports drinks more than 8 glasses of fluid daily.  Does not check BP on a regular basis.  Reports dizziness only last to a few seconds to a minute then goes away.   Reports did not take BP meds today.  BP reading at Echo today 116/68.  Advised pt BP could be cause of dizziness.  Advised pt if BP is 116/68 without taking BP meds it could go a lot lower with meds.  Advised pt to check BP prior to taking BP meds.  If BP still around 116/68 to only take half of amlodipine 10 mg.  Advised to check BP daily keep a log to bring to OV scheduled with DOD Dr. Jacques Navy on 06/07/23 at 1:30 pm.  Encouraged pt to call our office with further concerns. Pt had no further questions or concerns.

## 2023-06-02 NOTE — Telephone Encounter (Signed)
Called [name]. Since last visit notes shehas new symptoms of dizziness . There are no interval hospital/ED visit.   Echo showed Severe AS and mild to mod carotid disease  No chest pain or pressure .  No SOB but DOE coming down stairs.  No PND/Orthopnea.  No weight gain or leg swelling.  No palpitations.  She notes bendopnea and feeling light headed despite normal BP  We discussed Severe AS as a possible cause of her symptoms. I would like her to get evaluation for TAVR given her prolonged hospitalization most of 2023.  She lives 2 hours away and it is difficult to get back to visits.  I consented her for LHC and RHC; though my preference is for structural visit first, this may be less feasible due to the distance she lives from [facility].  Would recommend heart catheterization and structural evaluation for TAVR.  [name], MD FASE [facility] Cardiologist [facility]  [address] [place], [address] [place], [place] [address] [phone]  4:07 PM

## 2023-06-06 ENCOUNTER — Telehealth: Payer: Self-pay | Admitting: Internal Medicine

## 2023-06-06 DIAGNOSIS — I350 Nonrheumatic aortic (valve) stenosis: Secondary | ICD-10-CM

## 2023-06-06 DIAGNOSIS — I10 Essential (primary) hypertension: Secondary | ICD-10-CM

## 2023-06-06 NOTE — Telephone Encounter (Signed)
Sounds good.  Thanks MJP

## 2023-06-06 NOTE — Telephone Encounter (Signed)
Spoke with the patient who states that she talked with Dr. Izora Ribas last week and was told that she needs an appointment with the structural heart team as well as a heart catheterization. She is scheduled for a consult with Dr. Lynnette Caffey on Friday, 11/1 at 8am.  She would like to have cath done the same day since she has a long commute.  Called cath lab and they were able to schedule her for a 12:00pm cath with Dr. Rosemary Holms.

## 2023-06-06 NOTE — Telephone Encounter (Signed)
Patient states she calling in about a heart cath. Please advise

## 2023-06-07 ENCOUNTER — Ambulatory Visit: Payer: BLUE CROSS/BLUE SHIELD | Admitting: Internal Medicine

## 2023-06-07 ENCOUNTER — Telehealth: Payer: Self-pay | Admitting: Nurse Practitioner

## 2023-06-07 NOTE — Telephone Encounter (Signed)
Called pt would like to know if can wait until Thursday to have labs drawn.  Advised pt would be best to have drawn prior Thursday is cutting it kind of close.  Pt will have labs drawn.  Had questions regarding scheduling of procedure.  Advised our office doesn't schedule this is handled by the hospital.  All questions answered.

## 2023-06-07 NOTE — Telephone Encounter (Signed)
   Name:  Samantha Davidson  DOB:  12/26/1945  MRN:  [id number]   Primary Cardiologist: Christell Constant, MD  Chart reviewed as part of pre-operative protocol coverage. in reference to pre-operative risk assessment for pending surgery as outlined below.  Jamilah Hajj was last seen on 05/12/23 by Dr. Raynelle Jan.  Since that day, Shaquina Stepien has developed symptomatic aortic stenosis and will require TAVR.  She will therefore not be cleared for upcoming cataract procedure.  Please disregard previous clearance note sent granting clearance.  Napoleon Form, Leodis Rains, NP 06/07/2023, 11:15 AM

## 2023-06-07 NOTE — Telephone Encounter (Signed)
Patient requested a call back directly from [facility].

## 2023-06-07 NOTE — Telephone Encounter (Signed)
Called pt advised will need labs prior to Friday's [facility].  Pt lives in [place] will find a nearby [employer] and have labs drawn today.  Orders placed and released for draw.

## 2023-06-08 ENCOUNTER — Other Ambulatory Visit: Payer: Self-pay

## 2023-06-08 ENCOUNTER — Telehealth: Payer: Self-pay | Admitting: Internal Medicine

## 2023-06-08 DIAGNOSIS — I350 Nonrheumatic aortic (valve) stenosis: Secondary | ICD-10-CM

## 2023-06-08 NOTE — Telephone Encounter (Signed)
New Message:      Patient says she is scheduled for a Cath on Friday(06-10-23). She has a terrible cold or respiratory infection. She wants to know does she need to have the procedure or need to reschedule? She also has  an appointment for Consultation with Dr Lynnette Caffey on Friday morning at 8:00 AM, does she need to come for that?

## 2023-06-08 NOTE — Telephone Encounter (Signed)
Patient's consult visit and cath lab booking have been rescheduled.  Patient aware per Nurse Navigator Julieta Gutting, RN.

## 2023-06-08 NOTE — Telephone Encounter (Signed)
Called pt advised that OV and HC will have to be rescheduled.  Pt has been rescheduled to 06/16/23.

## 2023-06-10 ENCOUNTER — Ambulatory Visit: Payer: Medicare Other | Admitting: Internal Medicine

## 2023-06-15 ENCOUNTER — Telehealth: Payer: Self-pay | Admitting: *Deleted

## 2023-06-15 NOTE — Progress Notes (Unsigned)
Patient ID: Samantha Davidson MRN: [id number] DOB/AGE: 1946-04-03 77 y.o.  Primary Care Physician:McElwee, Jake Church, NP Primary Cardiologist: Lanier Prude, MD  FOCUSED CARDIOVASCULAR PROBLEM LIST:   Aortic stenosis; EKG sinus rhythm without bundle-branch blocks AVA 1 cm2, mean gradient 39 mmHg, V-max 4.2 m/s; EF 60 to 65% TTE 10/24 Hypertension Hyperlipidemia BMI 32 Carotid disease CKD stage III  HISTORY OF PRESENT ILLNESS: The patient is a 77 y.o. female with the indicated medical history here for recommendations regarding her severe aortic stenosis.  The patient is here with her son.  She has noticed increasing shortness of breath that is lifestyle limiting.  She endorses NYHA class II symptoms.  She does have a history of asthma and her asthma has been relatively stable.  She now has more shortness of breath walking around her house and doing her activities of daily living.  She has been constantly dizzy and sometimes this is worse when she goes from sitting to standing quickly.  She denies any frank syncope.  She did have a fall and did have a cervical neck fracture that required surgery a few years ago.  This was in the context of tripping when she was walking up stairs in her old house.  She denies any signs or symptoms of stroke, severe bleeding or bruising, paroxysmal nocturnal dyspnea, or severe edema.  She has noted some mild orthopnea and feels better when she is up on a couple pillows to sleep.  This is relatively new.  She sees a Education officer, community on a regular basis and reports good dental health.  Past Medical History:  Diagnosis Date   Allergy    Asthma    Depression    GERD (gastroesophageal reflux disease)    History of DVT of lower extremity    Hypertension    Lymphocytic colitis    Osteoporosis    Rectal polyp    Tubular adenoma of colon     Past Surgical History:  Procedure Laterality Date   COLONOSCOPY     FOOT SURGERY  08/09/1986   pin in both great  toes   HAND SURGERY Left     Family History  Problem Relation Age of Onset   Diabetes Mother    Lung cancer Mother    Heart disease Mother    Mental illness Mother    Alzheimer's disease Mother    Cancer Father    Arthritis Maternal Grandmother     Social History   Socioeconomic History   Marital status: Single    Spouse name: Not on file   Number of children: 2   Years of education: Not on file   Highest education level: Bachelor's degree (e.g., BA, AB, BS)  Occupational History   Occupation: Comptroller: [employer]    Employer: [employer]  Tobacco Use   Smoking status: Former   Smokeless tobacco: Never   Tobacco comments:    only when stressed  Vaping Use   Vaping status: Never Used  Substance and Sexual Activity   Alcohol use: Not Currently    Comment: wine on occasion   Drug use: No   Sexual activity: Not Currently  Other Topics Concern   Not on file  Social History Narrative   Not on file   Social Determinants of Health   Financial Resource Strain: Low Risk  (02/14/2023)   Overall Financial Resource Strain (CARDIA)    Difficulty of Paying Living Expenses: Not hard at all  Food Insecurity: No Food Insecurity (02/14/2023)   Hunger Vital Sign    Worried About Running Out of Food in the Last Year: Never true    Ran Out of Food in the Last Year: Never true  Transportation Needs: No Transportation Needs (02/14/2023)   PRAPARE - Administrator, Civil Service (Medical): No    Lack of Transportation (Non-Medical): No  Physical Activity: Insufficiently Active (02/14/2023)   Exercise Vital Sign    Days of Exercise per Week: 2 days    Minutes of Exercise per Session: 30 min  Stress: No Stress Concern Present (02/14/2023)   [employer] of Occupational Health - Occupational Stress Questionnaire    Feeling of Stress : Not at all  Social Connections: Unknown (02/14/2023)   Social Connection and Isolation Panel [NHANES]     Frequency of Communication with Friends and Family: Not on file    Frequency of Social Gatherings with Friends and Family: Not on file    Attends Religious Services: Never    Active Member of Clubs or Organizations: No    Attends Banker Meetings: Not on file    Marital Status: Widowed  Intimate Partner Violence: Not on file     Prior to Admission medications   Medication Sig Start Date End Date Taking? Authorizing Provider  albuterol (VENTOLIN HFA) 108 (90 Base) MCG/ACT inhaler Inhale 1-2 puffs into the lungs every 6 (six) hours as needed for wheezing or shortness of breath. 02/17/23   McElwee, Jake Church, NP  alendronate (FOSAMAX) 70 MG tablet Take 1 tablet (70 mg total) by mouth once a week. 02/17/23   McElwee, Lauren A, NP  amLODipine (NORVASC) 10 MG tablet Take 10 mg by mouth daily. 05/10/23   [provider]  aspirin 81 MG EC tablet Take 81 mg by mouth daily.      [provider]  atorvastatin (LIPITOR) 40 MG tablet Take 1 tablet (40 mg total) by mouth daily. 05/12/23   Christell Constant, MD  buPROPion (WELLBUTRIN XL) 300 MG 24 hr tablet 1 po qd 01/30/19   Zola Button, Grayling Congress, DO  chlorthalidone (HYGROTON) 25 MG tablet Take 1 tablet (25 mg total) by mouth daily. 02/17/23   McElwee, Lauren A, NP  cholecalciferol (VITAMIN D3) 25 MCG (1000 UNIT) tablet Take 1,000 Units by mouth daily.    [provider]  citalopram (CELEXA) 20 MG tablet Take 1 tablet (20 mg total) by mouth daily. 02/17/23   McElwee, Lauren A, NP  EPINEPHrine (EPIPEN) 0.3 mg/0.3 mL DEVI Inject 0.3 mLs (0.3 mg total) into the muscle once. 05/17/11   Donato Schultz, DO  ezetimibe (ZETIA) 10 MG tablet Take 1 tablet (10 mg total) by mouth daily. Patient not taking: Reported on 06/06/2023 05/12/23   Christell Constant, MD  famotidine (PEPCID) 40 MG tablet Take 1 tablet (40 mg total) by mouth at bedtime. Patient taking differently: Take 40 mg by mouth daily as needed (Asthma).  02/17/23   McElwee, Lauren A, NP  fluticasone-salmeterol (ADVAIR) 100-50 MCG/ACT AEPB Inhale 1 puff into the lungs 2 (two) times daily. Patient taking differently: Inhale 1 puff into the lungs 2 (two) times daily as needed (Asthma). 02/17/23   McElwee, Lauren A, NP  fluticasone-salmeterol (WIXELA INHUB) 100-50 MCG/ACT AEPB Inhale 1 puff into the lungs 2 (two) times daily.    [provider]  ibuprofen (ADVIL,MOTRIN) 800 MG tablet TAKE 1 TABLET BY MOUTH EVERY 6-8 HOURS AS NEEDED FOR PAIN 11/23/17  [provider]  loratadine (CLARITIN) 10 MG tablet Take 10 mg by mouth daily.    [provider]  Multiple Vitamin (MULTIVITAMIN PO) Take 1 tablet by mouth daily.      [provider]  valsartan (DIOVAN) 40 MG tablet Take 1 tablet (40 mg total) by mouth daily. 02/17/23   McElwee, Jake Church, NP    Allergies  Allergen Reactions   Codeine Nausea And Vomiting   Doxycycline Diarrhea    REVIEW OF SYSTEMS:  General: no fevers/chills/night sweats Eyes: no blurry vision, diplopia, or amaurosis ENT: no sore throat or hearing loss Resp: no cough, wheezing, or hemoptysis CV: no edema or palpitations GI: no abdominal pain, nausea, vomiting, diarrhea, or constipation GU: no dysuria, frequency, or hematuria Skin: no rash Neuro: no headache, numbness, tingling, or weakness of extremities Musculoskeletal: no joint pain or swelling Heme: no bleeding, DVT, or easy bruising Endo: no polydipsia or polyuria  BP 122/80 (BP Location: Left Arm, Patient Position: Sitting, Cuff Size: Normal)   Pulse 73   Resp 16   Ht 5\' 3"  (1.6 m)   Wt 180 lb (81.6 kg)   SpO2 95%   BMI 31.89 kg/m   PHYSICAL EXAM: GEN:  AO x 3 in no acute distress HEENT: normal Dentition: Normal Neck: JVP normal. +2carotid upstrokes without bruits. No thyromegaly. Lungs: equal expansion, clear bilaterally CV: Apex is discrete and nondisplaced, RRR with 3/6 SEM Abd: soft, non-tender, non-distended; no bruit;  positive bowel sounds Ext: no edema, ecchymoses, or cyanosis Vascular: 2+ femoral pulses, 2+ radial pulses       Skin: warm and dry without rash Neuro: CN II-XII grossly intact; motor and sensory grossly intact    DATA AND STUDIES:  EKG:  EKG Interpretation Date/Time:  Thursday June 16 2023 08:31:16 EST Ventricular Rate:  73 PR Interval:  146 QRS Duration:  82 QT Interval:  398 QTC Calculation: 438 R Axis:   -13  Text Interpretation: Normal sinus rhythm with sinus arrhythmia Normal ECG When compared with ECG of 12-May-2023 14:22, No significant change was found Confirmed by Alverda Skeans ([id number]) on 06/16/2023 9:18:59 AM        Cardiac Studies & Procedures       ECHOCARDIOGRAM  ECHOCARDIOGRAM COMPLETE 06/02/2023  Narrative ECHOCARDIOGRAM REPORT    Patient Name:   Samantha Davidson Date of Exam: 06/02/2023 Medical Rec #:  [id number]               Height:       63.5 in Accession #:    [id number]              Weight:       183.0 lb Date of Birth:  1946-05-04               BSA:          1.873 m Patient Age:    77 years                BP:           116/68 mmHg Patient Gender: F                       HR:           88 bpm. Exam Location:  [address]  Procedure: 2D Echo, Cardiac Doppler, Color Doppler, 3D Echo and Strain Analysis  Indications:    I35.0 Nonrheumatic aortic (valve) stenosis; R01.1 Murmur  History:  Patient has no prior history of Echocardiogram examinations. Risk Factors:Hypertension and Dyslipidemia. Bilateral carotid artery stenosis.  Sonographer:    Cathie Beams RCS Referring Phys: [id number] [facility]DR[facility]  IMPRESSIONS   1. Left ventricular ejection fraction, by estimation, is 60 to 65%. The left ventricle has normal function. The left ventricle has no regional wall motion abnormalities. Left ventricular diastolic parameters are consistent with Grade I diastolic dysfunction (impaired relaxation). The average left  ventricular global longitudinal strain is -22.4 %. The global longitudinal strain is normal. 2. Right ventricular systolic function is normal. The right ventricular size is normal. There is normal pulmonary artery systolic pressure. The estimated right ventricular systolic pressure is 25.1 mmHg. 3. The mitral valve is degenerative. No evidence of mitral valve regurgitation. No evidence of mitral stenosis. Moderate mitral annular calcification. 4. The aortic valve is calcified. Aortic valve regurgitation is not visualized. Severe aortic valve stenosis. Aortic valve area, by VTI measures 1.07 cm. Aortic valve mean gradient measures 39.0 mmHg. Aortic valve Vmax measures 4.16 m/s. DVI 0.34. 5. The inferior vena cava is normal in size with greater than 50% respiratory variability, suggesting right atrial pressure of 3 mmHg.  FINDINGS Left Ventricle: Left ventricular ejection fraction, by estimation, is 60 to 65%. The left ventricle has normal function. The left ventricle has no regional wall motion abnormalities. The average left ventricular global longitudinal strain is -22.4 %. The global longitudinal strain is normal. The left ventricular internal cavity size was normal in size. There is no left ventricular hypertrophy. Left ventricular diastolic parameters are consistent with Grade I diastolic dysfunction (impaired relaxation). Normal left ventricular filling pressure.  Right Ventricle: The right ventricular size is normal. No increase in right ventricular wall thickness. Right ventricular systolic function is normal. There is normal pulmonary artery systolic pressure. The tricuspid regurgitant velocity is 2.35 m/s, and with an assumed right atrial pressure of 3 mmHg, the estimated right ventricular systolic pressure is 25.1 mmHg.  Left Atrium: Left atrial size was normal in size.  Right Atrium: Right atrial size was normal in size.  Pericardium: There is no evidence of pericardial  effusion.  Mitral Valve: The mitral valve is degenerative in appearance. There is mild thickening of the mitral valve leaflet(s). There is mild calcification of the mitral valve leaflet(s). Moderate mitral annular calcification. No evidence of mitral valve regurgitation. No evidence of mitral valve stenosis.  Tricuspid Valve: The tricuspid valve is normal in structure. Tricuspid valve regurgitation is mild . No evidence of tricuspid stenosis.  Aortic Valve: The aortic valve is calcified. Aortic valve regurgitation is not visualized. Severe aortic stenosis is present. Aortic valve mean gradient measures 39.0 mmHg. Aortic valve peak gradient measures 69.2 mmHg. Aortic valve area, by VTI measures 1.07 cm.  Pulmonic Valve: The pulmonic valve was normal in structure. Pulmonic valve regurgitation is mild. No evidence of pulmonic stenosis.  Aorta: The aortic root is normal in size and structure.  Venous: The inferior vena cava is normal in size with greater than 50% respiratory variability, suggesting right atrial pressure of 3 mmHg.  IAS/Shunts: No atrial level shunt detected by color flow Doppler.   LEFT VENTRICLE PLAX 2D LVIDd:         3.80 cm   Diastology LVIDs:         2.10 cm   LV e' medial:    7.51 cm/s LV PW:         0.70 cm   LV E/e' medial:  12.5 LV IVS:  1.00 cm   LV e' lateral:   8.27 cm/s LVOT diam:     2.00 cm   LV E/e' lateral: 11.3 LV SV:         95 LV SV Index:   51        2D Longitudinal Strain LVOT Area:     3.14 cm  2D Strain GLS (A2C):   -21.5 % 2D Strain GLS (A3C):   -26.0 % 2D Strain GLS (A4C):   -19.7 % 2D Strain GLS Avg:     -22.4 %  RIGHT VENTRICLE RV Basal diam:  2.90 cm RV S prime:     14.50 cm/s TAPSE (M-mode): 2.5 cm RVSP:           25.1 mmHg  LEFT ATRIUM             Index        RIGHT ATRIUM           Index LA diam:        3.40 cm 1.82 cm/m   RA Pressure: 3.00 mmHg LA Vol (A2C):   58.5 ml 31.23 ml/m  RA Area:     12.00 cm LA Vol (A4C):    55.8 ml 29.79 ml/m  RA Volume:   27.70 ml  14.79 ml/m LA Biplane Vol: 58.3 ml 31.13 ml/m AORTIC VALVE AV Area (Vmax):    1.03 cm AV Area (Vmean):   1.03 cm AV Area (VTI):     1.07 cm AV Vmax:           416.00 cm/s AV Vmean:          292.500 cm/s AV VTI:            0.887 m AV Peak Grad:      69.2 mmHg AV Mean Grad:      39.0 mmHg LVOT Vmax:         136.00 cm/s LVOT Vmean:        95.500 cm/s LVOT VTI:          0.303 m LVOT/AV VTI ratio: 0.34  AORTA Ao Root diam: 3.20 cm Ao Asc diam:  3.00 cm  MITRAL VALVE                TRICUSPID VALVE MV Area (PHT): 2.23 cm     TR Peak grad:   22.1 mmHg MV Decel Time: 340 msec     TR Vmax:        235.00 cm/s MV E velocity: 93.80 cm/s   Estimated RAP:  3.00 mmHg MV A velocity: 138.00 cm/s  RVSP:           25.1 mmHg MV E/A ratio:  0.68 SHUNTS Systemic VTI:  0.30 m Systemic Diam: 2.00 cm  Armanda Magic MD Electronically signed by Armanda Magic MD Signature Date/Time: 06/02/2023/3:31:10 PM    Final             02/17/2023: ALT 13; BUN 20; Creatinine, Ser 1.05; Hemoglobin 12.3; Platelets 229.0; Potassium 3.8; Sodium 137; TSH 1.09   STS RISK CALCULATOR: Pending  NHYA CLASS: 2    ASSESSMENT AND PLAN:   Aortic valve stenosis, etiology of cardiac valve disease unspecified - Plan: EKG 12-Lead  The patient has developed severe symptomatic aortic stenosis with NYHA class II symptoms.  The patient is scheduled for CT scan tomorrow and a coronary angiography and right heart catheterization procedure later today.  Will refer her to cardiothoracic surgery for surgical opinion.  After all of  these pieces of data have been reviewed we will offer recommendations regarding treatment of her aortic stenosis.  She is not having any exertional angina so if any obstructive coronary artery disease is seen I think we should treat this medically for now.  All questions were answered today.  I have personally reviewed the patients imaging data as  summarized above.  I have reviewed the natural history of aortic stenosis with the patient and family members who are present today. We have discussed the limitations of medical therapy and the poor prognosis associated with symptomatic aortic stenosis. We have also reviewed potential treatment options, including palliative medical therapy, conventional surgical aortic valve replacement, and transcatheter aortic valve replacement. We discussed treatment options in the context of this patient's specific comorbid medical conditions.   All of the patient's questions were answered today. Will make further recommendations based on the results of studies outlined above.   Total time spent with patient today 45 minutes. This includes reviewing records, evaluating the patient and coordinating care.   Orbie Pyo, MD  06/16/2023 9:20 AM    [facility] [address] [place], [place], [place]  [address] Phone: [phone]; Fax: [phone]

## 2023-06-15 NOTE — H&P (View-Only) (Signed)
Patient ID: Samantha Davidson MRN: [id number] DOB/AGE: 76-Apr-1947 32 y.o.  Primary Care Physician:McElwee, Jake Church, NP Primary Cardiologist: Lanier Prude, MD  FOCUSED CARDIOVASCULAR PROBLEM LIST:   Aortic stenosis; EKG sinus rhythm without bundle-branch blocks AVA 1 cm2, mean gradient 39 mmHg, V-max 4.2 m/s; EF 60 to 65% TTE 10/24 Hypertension Hyperlipidemia BMI 32 Carotid disease CKD stage III  HISTORY OF PRESENT ILLNESS: The patient is a 77 y.o. female with the indicated medical history here for recommendations regarding her severe aortic stenosis.  The patient is here with her son.  She has noticed increasing shortness of breath that is lifestyle limiting.  She endorses NYHA class II symptoms.  She does have a history of asthma and her asthma has been relatively stable.  She now has more shortness of breath walking around her house and doing her activities of daily living.  She has been constantly dizzy and sometimes this is worse when she goes from sitting to standing quickly.  She denies any frank syncope.  She did have a fall and did have a cervical neck fracture that required surgery a few years ago.  This was in the context of tripping when she was walking up stairs in her old house.  She denies any signs or symptoms of stroke, severe bleeding or bruising, paroxysmal nocturnal dyspnea, or severe edema.  She has noted some mild orthopnea and feels better when she is up on a couple pillows to sleep.  This is relatively new.  She sees a Education officer, community on a regular basis and reports good dental health.  Past Medical History:  Diagnosis Date   Allergy    Asthma    Depression    GERD (gastroesophageal reflux disease)    History of DVT of lower extremity    Hypertension    Lymphocytic colitis    Osteoporosis    Rectal polyp    Tubular adenoma of colon     Past Surgical History:  Procedure Laterality Date   COLONOSCOPY     FOOT SURGERY  08/09/1986   pin in both great  toes   HAND SURGERY Left     Family History  Problem Relation Age of Onset   Diabetes Mother    Lung cancer Mother    Heart disease Mother    Mental illness Mother    Alzheimer's disease Mother    Cancer Father    Arthritis Maternal Grandmother     Social History   Socioeconomic History   Marital status: Single    Spouse name: Not on file   Number of children: 2   Years of education: Not on file   Highest education level: Bachelor's degree (e.g., BA, AB, BS)  Occupational History   Occupation: Comptroller: [employer]    Employer: [employer]  Tobacco Use   Smoking status: Former   Smokeless tobacco: Never   Tobacco comments:    only when stressed  Vaping Use   Vaping status: Never Used  Substance and Sexual Activity   Alcohol use: Not Currently    Comment: wine on occasion   Drug use: No   Sexual activity: Not Currently  Other Topics Concern   Not on file  Social History Narrative   Not on file   Social Determinants of Health   Financial Resource Strain: Low Risk  (02/14/2023)   Overall Financial Resource Strain (CARDIA)    Difficulty of Paying Living Expenses: Not hard at all  Food Insecurity: No Food Insecurity (02/14/2023)   Hunger Vital Sign    Worried About Running Out of Food in the Last Year: Never true    Ran Out of Food in the Last Year: Never true  Transportation Needs: No Transportation Needs (02/14/2023)   PRAPARE - Administrator, Civil Service (Medical): No    Lack of Transportation (Non-Medical): No  Physical Activity: Insufficiently Active (02/14/2023)   Exercise Vital Sign    Days of Exercise per Week: 2 days    Minutes of Exercise per Session: 30 min  Stress: No Stress Concern Present (02/14/2023)   [employer] of Occupational Health - Occupational Stress Questionnaire    Feeling of Stress : Not at all  Social Connections: Unknown (02/14/2023)   Social Connection and Isolation Panel [NHANES]     Frequency of Communication with Friends and Family: Not on file    Frequency of Social Gatherings with Friends and Family: Not on file    Attends Religious Services: Never    Active Member of Clubs or Organizations: No    Attends Banker Meetings: Not on file    Marital Status: Widowed  Intimate Partner Violence: Not on file     Prior to Admission medications   Medication Sig Start Date End Date Taking? Authorizing Provider  albuterol (VENTOLIN HFA) 108 (90 Base) MCG/ACT inhaler Inhale 1-2 puffs into the lungs every 6 (six) hours as needed for wheezing or shortness of breath. 02/17/23   McElwee, Jake Church, NP  alendronate (FOSAMAX) 70 MG tablet Take 1 tablet (70 mg total) by mouth once a week. 02/17/23   McElwee, Lauren A, NP  amLODipine (NORVASC) 10 MG tablet Take 10 mg by mouth daily. 05/10/23   [provider]  aspirin 81 MG EC tablet Take 81 mg by mouth daily.      [provider]  atorvastatin (LIPITOR) 40 MG tablet Take 1 tablet (40 mg total) by mouth daily. 05/12/23   Christell Constant, MD  buPROPion (WELLBUTRIN XL) 300 MG 24 hr tablet 1 po qd 01/30/19   Zola Button, Grayling Congress, DO  chlorthalidone (HYGROTON) 25 MG tablet Take 1 tablet (25 mg total) by mouth daily. 02/17/23   McElwee, Lauren A, NP  cholecalciferol (VITAMIN D3) 25 MCG (1000 UNIT) tablet Take 1,000 Units by mouth daily.    [provider]  citalopram (CELEXA) 20 MG tablet Take 1 tablet (20 mg total) by mouth daily. 02/17/23   McElwee, Lauren A, NP  EPINEPHrine (EPIPEN) 0.3 mg/0.3 mL DEVI Inject 0.3 mLs (0.3 mg total) into the muscle once. 05/17/11   Donato Schultz, DO  ezetimibe (ZETIA) 10 MG tablet Take 1 tablet (10 mg total) by mouth daily. Patient not taking: Reported on 06/06/2023 05/12/23   Christell Constant, MD  famotidine (PEPCID) 40 MG tablet Take 1 tablet (40 mg total) by mouth at bedtime. Patient taking differently: Take 40 mg by mouth daily as needed (Asthma).  02/17/23   McElwee, Lauren A, NP  fluticasone-salmeterol (ADVAIR) 100-50 MCG/ACT AEPB Inhale 1 puff into the lungs 2 (two) times daily. Patient taking differently: Inhale 1 puff into the lungs 2 (two) times daily as needed (Asthma). 02/17/23   McElwee, Lauren A, NP  fluticasone-salmeterol (WIXELA INHUB) 100-50 MCG/ACT AEPB Inhale 1 puff into the lungs 2 (two) times daily.    [provider]  ibuprofen (ADVIL,MOTRIN) 800 MG tablet TAKE 1 TABLET BY MOUTH EVERY 6-8 HOURS AS NEEDED FOR PAIN 11/23/17  [provider]  loratadine (CLARITIN) 10 MG tablet Take 10 mg by mouth daily.    [provider]  Multiple Vitamin (MULTIVITAMIN PO) Take 1 tablet by mouth daily.      [provider]  valsartan (DIOVAN) 40 MG tablet Take 1 tablet (40 mg total) by mouth daily. 02/17/23   McElwee, Jake Church, NP    Allergies  Allergen Reactions   Codeine Nausea And Vomiting   Doxycycline Diarrhea    REVIEW OF SYSTEMS:  General: no fevers/chills/night sweats Eyes: no blurry vision, diplopia, or amaurosis ENT: no sore throat or hearing loss Resp: no cough, wheezing, or hemoptysis CV: no edema or palpitations GI: no abdominal pain, nausea, vomiting, diarrhea, or constipation GU: no dysuria, frequency, or hematuria Skin: no rash Neuro: no headache, numbness, tingling, or weakness of extremities Musculoskeletal: no joint pain or swelling Heme: no bleeding, DVT, or easy bruising Endo: no polydipsia or polyuria  BP 122/80 (BP Location: Left Arm, Patient Position: Sitting, Cuff Size: Normal)   Pulse 73   Resp 16   Ht 5\' 3"  (1.6 m)   Wt 180 lb (81.6 kg)   SpO2 95%   BMI 31.89 kg/m   PHYSICAL EXAM: GEN:  AO x 3 in no acute distress HEENT: normal Dentition: Normal Neck: JVP normal. +2carotid upstrokes without bruits. No thyromegaly. Lungs: equal expansion, clear bilaterally CV: Apex is discrete and nondisplaced, RRR with 3/6 SEM Abd: soft, non-tender, non-distended; no bruit;  positive bowel sounds Ext: no edema, ecchymoses, or cyanosis Vascular: 2+ femoral pulses, 2+ radial pulses       Skin: warm and dry without rash Neuro: CN II-XII grossly intact; motor and sensory grossly intact    DATA AND STUDIES:  EKG:  EKG Interpretation Date/Time:  Thursday June 16 2023 08:31:16 EST Ventricular Rate:  73 PR Interval:  146 QRS Duration:  82 QT Interval:  398 QTC Calculation: 438 R Axis:   -13  Text Interpretation: Normal sinus rhythm with sinus arrhythmia Normal ECG When compared with ECG of 12-May-2023 14:22, No significant change was found Confirmed by Alverda Skeans ([id number]) on 06/16/2023 9:18:59 AM        Cardiac Studies & Procedures       ECHOCARDIOGRAM  ECHOCARDIOGRAM COMPLETE 06/02/2023  Narrative ECHOCARDIOGRAM REPORT    Patient Name:   Samantha Davidson Date of Exam: 06/02/2023 Medical Rec #:  [id number]               Height:       63.5 in Accession #:    [id number]              Weight:       183.0 lb Date of Birth:  Dec 22, 1945               BSA:          1.873 m Patient Age:    77 years                BP:           116/68 mmHg Patient Gender: F                       HR:           88 bpm. Exam Location:  [address]  Procedure: 2D Echo, Cardiac Doppler, Color Doppler, 3D Echo and Strain Analysis  Indications:    I35.0 Nonrheumatic aortic (valve) stenosis; R01.1 Murmur  History:  Patient has no prior history of Echocardiogram examinations. Risk Factors:Hypertension and Dyslipidemia. Bilateral carotid artery stenosis.  Sonographer:    Cathie Beams RCS Referring Phys: [id number] [facility] A CHANDRASE[facility]AR  IMPRESSIONS   1. Left ventricular ejection fraction, by estimation, is 60 to 65%. The left ventricle has normal function. The left ventricle has no regional wall motion abnormalities. Left ventricular diastolic parameters are consistent with Grade I diastolic dysfunction (impaired relaxation). The average left  ventricular global longitudinal strain is -22.4 %. The global longitudinal strain is normal. 2. Right ventricular systolic function is normal. The right ventricular size is normal. There is normal pulmonary artery systolic pressure. The estimated right ventricular systolic pressure is 25.1 mmHg. 3. The mitral valve is degenerative. No evidence of mitral valve regurgitation. No evidence of mitral stenosis. Moderate mitral annular calcification. 4. The aortic valve is calcified. Aortic valve regurgitation is not visualized. Severe aortic valve stenosis. Aortic valve area, by VTI measures 1.07 cm. Aortic valve mean gradient measures 39.0 mmHg. Aortic valve Vmax measures 4.16 m/s. DVI 0.34. 5. The inferior vena cava is normal in size with greater than 50% respiratory variability, suggesting right atrial pressure of 3 mmHg.  FINDINGS Left Ventricle: Left ventricular ejection fraction, by estimation, is 60 to 65%. The left ventricle has normal function. The left ventricle has no regional wall motion abnormalities. The average left ventricular global longitudinal strain is -22.4 %. The global longitudinal strain is normal. The left ventricular internal cavity size was normal in size. There is no left ventricular hypertrophy. Left ventricular diastolic parameters are consistent with Grade I diastolic dysfunction (impaired relaxation). Normal left ventricular filling pressure.  Right Ventricle: The right ventricular size is normal. No increase in right ventricular wall thickness. Right ventricular systolic function is normal. There is normal pulmonary artery systolic pressure. The tricuspid regurgitant velocity is 2.35 m/s, and with an assumed right atrial pressure of 3 mmHg, the estimated right ventricular systolic pressure is 25.1 mmHg.  Left Atrium: Left atrial size was normal in size.  Right Atrium: Right atrial size was normal in size.  Pericardium: There is no evidence of pericardial  effusion.  Mitral Valve: The mitral valve is degenerative in appearance. There is mild thickening of the mitral valve leaflet(s). There is mild calcification of the mitral valve leaflet(s). Moderate mitral annular calcification. No evidence of mitral valve regurgitation. No evidence of mitral valve stenosis.  Tricuspid Valve: The tricuspid valve is normal in structure. Tricuspid valve regurgitation is mild . No evidence of tricuspid stenosis.  Aortic Valve: The aortic valve is calcified. Aortic valve regurgitation is not visualized. Severe aortic stenosis is present. Aortic valve mean gradient measures 39.0 mmHg. Aortic valve peak gradient measures 69.2 mmHg. Aortic valve area, by VTI measures 1.07 cm.  Pulmonic Valve: The pulmonic valve was normal in structure. Pulmonic valve regurgitation is mild. No evidence of pulmonic stenosis.  Aorta: The aortic root is normal in size and structure.  Venous: The inferior vena cava is normal in size with greater than 50% respiratory variability, suggesting right atrial pressure of 3 mmHg.  IAS/Shunts: No atrial level shunt detected by color flow Doppler.   LEFT VENTRICLE PLAX 2D LVIDd:         3.80 cm   Diastology LVIDs:         2.10 cm   LV e' medial:    7.51 cm/s LV PW:         0.70 cm   LV E/e' medial:  12.5 LV IVS:  1.00 cm   LV e' lateral:   8.27 cm/s LVOT diam:     2.00 cm   LV E/e' lateral: 11.3 LV SV:         95 LV SV Index:   51        2D Longitudinal Strain LVOT Area:     3.14 cm  2D Strain GLS (A2C):   -21.5 % 2D Strain GLS (A3C):   -26.0 % 2D Strain GLS (A4C):   -19.7 % 2D Strain GLS Avg:     -22.4 %  RIGHT VENTRICLE RV Basal diam:  2.90 cm RV S prime:     14.50 cm/s TAPSE (M-mode): 2.5 cm RVSP:           25.1 mmHg  LEFT ATRIUM             Index        RIGHT ATRIUM           Index LA diam:        3.40 cm 1.82 cm/m   RA Pressure: 3.00 mmHg LA Vol (A2C):   58.5 ml 31.23 ml/m  RA Area:     12.00 cm LA Vol (A4C):    55.8 ml 29.79 ml/m  RA Volume:   27.70 ml  14.79 ml/m LA Biplane Vol: 58.3 ml 31.13 ml/m AORTIC VALVE AV Area (Vmax):    1.03 cm AV Area (Vmean):   1.03 cm AV Area (VTI):     1.07 cm AV Vmax:           416.00 cm/s AV Vmean:          292.500 cm/s AV VTI:            0.887 m AV Peak Grad:      69.2 mmHg AV Mean Grad:      39.0 mmHg LVOT Vmax:         136.00 cm/s LVOT Vmean:        95.500 cm/s LVOT VTI:          0.303 m LVOT/AV VTI ratio: 0.34  AORTA Ao Root diam: 3.20 cm Ao Asc diam:  3.00 cm  MITRAL VALVE                TRICUSPID VALVE MV Area (PHT): 2.23 cm     TR Peak grad:   22.1 mmHg MV Decel Time: 340 msec     TR Vmax:        235.00 cm/s MV E velocity: 93.80 cm/s   Estimated RAP:  3.00 mmHg MV A velocity: 138.00 cm/s  RVSP:           25.1 mmHg MV E/A ratio:  0.68 SHUNTS Systemic VTI:  0.30 m Systemic Diam: 2.00 cm  Armanda Magic MD Electronically signed by Armanda Magic MD Signature Date/Time: 06/02/2023/3:31:10 PM    Final             02/17/2023: ALT 13; BUN 20; Creatinine, Ser 1.05; Hemoglobin 12.3; Platelets 229.0; Potassium 3.8; Sodium 137; TSH 1.09   STS RISK CALCULATOR: Pending  NHYA CLASS: 2    ASSESSMENT AND PLAN:   Aortic valve stenosis, etiology of cardiac valve disease unspecified - Plan: EKG 12-Lead  The patient has developed severe symptomatic aortic stenosis with NYHA class II symptoms.  The patient is scheduled for CT scan tomorrow and a coronary angiography and right heart catheterization procedure later today.  Will refer her to cardiothoracic surgery for surgical opinion.  After all of  these pieces of data have been reviewed we will offer recommendations regarding treatment of her aortic stenosis.  She is not having any exertional angina so if any obstructive coronary artery disease is seen I think we should treat this medically for now.  All questions were answered today.  I have personally reviewed the patients imaging data as  summarized above.  I have reviewed the natural history of aortic stenosis with the patient and family members who are present today. We have discussed the limitations of medical therapy and the poor prognosis associated with symptomatic aortic stenosis. We have also reviewed potential treatment options, including palliative medical therapy, conventional surgical aortic valve replacement, and transcatheter aortic valve replacement. We discussed treatment options in the context of this patient's specific comorbid medical conditions.   All of the patient's questions were answered today. Will make further recommendations based on the results of studies outlined above.   Total time spent with patient today 45 minutes. This includes reviewing records, evaluating the patient and coordinating care.   Orbie Pyo, MD  06/16/2023 9:20 AM    [facility] [address] [place], [place], [place]  [address] Phone: [phone]; Fax: [phone]

## 2023-06-15 NOTE — Telephone Encounter (Signed)
Cardiac Catheterization scheduled at [facility] for: Friday June 16, 2023 12 Noon Arrival time [facility] Main Entrance A at: 10 AM  Nothing to eat after midnight prior to procedure, clear liquids until 5 AM day of procedure.  Medication instructions: -Hold:  Chlorthalidone-AM of procedure -Other usual morning medications can be taken with sips of water including aspirin 81 mg.  Plan to go home the same day, you will only stay overnight if medically necessary.  You must have responsible adult to drive you home.  Someone must be with you the first 24 hours after you arrive home.  Reviewed procedure instructions with patient.

## 2023-06-16 ENCOUNTER — Other Ambulatory Visit (HOSPITAL_COMMUNITY): Payer: Self-pay

## 2023-06-16 ENCOUNTER — Encounter: Payer: Self-pay | Admitting: Internal Medicine

## 2023-06-16 ENCOUNTER — Other Ambulatory Visit: Payer: Self-pay

## 2023-06-16 ENCOUNTER — Ambulatory Visit (HOSPITAL_COMMUNITY)
Admission: RE | Admit: 2023-06-16 | Discharge: 2023-06-16 | Disposition: A | Discharge status: Home / Self Care | Payer: Medicare Other | Attending: Cardiovascular Disease | Admitting: Cardiovascular Disease

## 2023-06-16 ENCOUNTER — Ambulatory Visit: Payer: Medicare Other | Admitting: Internal Medicine

## 2023-06-16 ENCOUNTER — Encounter (HOSPITAL_COMMUNITY)
Admission: RE | Disposition: A | Discharge status: Home / Self Care | Payer: Self-pay | Source: Home / Self Care | Attending: Cardiovascular Disease

## 2023-06-16 VITALS — BP 122/80 | HR 73 | Resp 16 | Ht 63.0 in | Wt 180.0 lb

## 2023-06-16 DIAGNOSIS — I10 Essential (primary) hypertension: Secondary | ICD-10-CM | POA: Insufficient documentation

## 2023-06-16 DIAGNOSIS — Z01812 Encounter for preprocedural laboratory examination: Secondary | ICD-10-CM

## 2023-06-16 DIAGNOSIS — Z87891 Personal history of nicotine dependence: Secondary | ICD-10-CM | POA: Insufficient documentation

## 2023-06-16 DIAGNOSIS — Z86718 Personal history of other venous thrombosis and embolism: Secondary | ICD-10-CM | POA: Insufficient documentation

## 2023-06-16 DIAGNOSIS — I350 Nonrheumatic aortic (valve) stenosis: Secondary | ICD-10-CM

## 2023-06-16 DIAGNOSIS — Z8249 Family history of ischemic heart disease and other diseases of the circulatory system: Secondary | ICD-10-CM | POA: Insufficient documentation

## 2023-06-16 DIAGNOSIS — J45909 Unspecified asthma, uncomplicated: Secondary | ICD-10-CM | POA: Insufficient documentation

## 2023-06-16 DIAGNOSIS — I2510 Atherosclerotic heart disease of native coronary artery without angina pectoris: Secondary | ICD-10-CM | POA: Diagnosis not present

## 2023-06-16 HISTORY — PX: RIGHT HEART CATH AND CORONARY ANGIOGRAPHY: CATH118264

## 2023-06-16 LAB — POCT I-STAT 7, (LYTES, BLD GAS, ICA,H+H)
Acid-Base Excess: 1 mmol/L (ref 0.0–2.0)
Bicarbonate: 24.8 mmol/L (ref 20.0–28.0)
Calcium, Ion: 1.06 mmol/L — ABNORMAL LOW (ref 1.15–1.40)
HCT: 35 % — ABNORMAL LOW (ref 36.0–46.0)
Hemoglobin: 11.9 g/dL — ABNORMAL LOW (ref 12.0–15.0)
O2 Saturation: 94 %
Potassium: 3 mmol/L — ABNORMAL LOW (ref 3.5–5.1)
Sodium: 135 mmol/L (ref 135–145)
TCO2: 26 mmol/L (ref 22–32)
pCO2 arterial: 36.9 mm[Hg] (ref 32–48)
pH, Arterial: 7.436 (ref 7.35–7.45)
pO2, Arterial: 67 mm[Hg] — ABNORMAL LOW (ref 83–108)

## 2023-06-16 LAB — CBC
HCT: 39.2 % (ref 36.0–46.0)
Hemoglobin: 13.1 g/dL (ref 12.0–15.0)
MCH: 32.5 pg (ref 26.0–34.0)
MCHC: 33.4 g/dL (ref 30.0–36.0)
MCV: 97.3 fL (ref 80.0–100.0)
Platelets: 272 10*3/uL (ref 150–400)
RBC: 4.03 MIL/uL (ref 3.87–5.11)
RDW: 12.2 % (ref 11.5–15.5)
WBC: 10.7 10*3/uL — ABNORMAL HIGH (ref 4.0–10.5)
nRBC: 0 % (ref 0.0–0.2)

## 2023-06-16 LAB — BASIC METABOLIC PANEL
Anion gap: 9 (ref 5–15)
BUN: 17 mg/dL (ref 8–23)
CO2: 28 mmol/L (ref 22–32)
Calcium: 8.9 mg/dL (ref 8.9–10.3)
Chloride: 97 mmol/L — ABNORMAL LOW (ref 98–111)
Creatinine, Ser: 1.06 mg/dL — ABNORMAL HIGH (ref 0.44–1.00)
GFR, Estimated: 54 mL/min — ABNORMAL LOW (ref >60)
Glucose, Bld: 119 mg/dL — ABNORMAL HIGH (ref 70–99)
Potassium: 2.9 mmol/L — ABNORMAL LOW (ref 3.5–5.1)
Sodium: 134 mmol/L — ABNORMAL LOW (ref 135–145)

## 2023-06-16 LAB — POCT I-STAT EG7
Acid-Base Excess: 2 mmol/L (ref 0.0–2.0)
Bicarbonate: 26.3 mmol/L (ref 20.0–28.0)
Calcium, Ion: 1.09 mmol/L — ABNORMAL LOW (ref 1.15–1.40)
HCT: 35 % — ABNORMAL LOW (ref 36.0–46.0)
Hemoglobin: 11.9 g/dL — ABNORMAL LOW (ref 12.0–15.0)
O2 Saturation: 69 %
Potassium: 3 mmol/L — ABNORMAL LOW (ref 3.5–5.1)
Sodium: 135 mmol/L (ref 135–145)
TCO2: 28 mmol/L (ref 22–32)
pCO2, Ven: 40.8 mm[Hg] — ABNORMAL LOW (ref 44–60)
pH, Ven: 7.417 (ref 7.25–7.43)
pO2, Ven: 36 mm[Hg] (ref 32–45)

## 2023-06-16 SURGERY — RIGHT HEART CATH AND CORONARY ANGIOGRAPHY
Anesthesia: LOCAL

## 2023-06-16 MED ORDER — SODIUM CHLORIDE 0.9 % WEIGHT BASED INFUSION
3.0000 mL/kg/h | INTRAVENOUS | Status: AC
  Administered 2023-06-16: 3 mL/kg/h via INTRAVENOUS

## 2023-06-16 MED ORDER — POTASSIUM CHLORIDE CRYS ER 20 MEQ PO TBCR
60.0000 meq | EXTENDED_RELEASE_TABLET | Freq: Once | ORAL | Status: AC
  Administered 2023-06-16: 60 meq via ORAL
  Filled 2023-06-16: qty 3

## 2023-06-16 MED ORDER — ACETAMINOPHEN 325 MG PO TABS: 650.0000 mg | ORAL_TABLET | ORAL | Status: DC | PRN

## 2023-06-16 MED ORDER — HEPARIN SODIUM (PORCINE) 1000 UNIT/ML IJ SOLN
INTRAMUSCULAR | Status: AC
  Filled 2023-06-16: qty 10

## 2023-06-16 MED ORDER — LIDOCAINE HCL (PF) 1 % IJ SOLN
INTRAMUSCULAR | Status: AC
  Filled 2023-06-16: qty 30

## 2023-06-16 MED ORDER — SODIUM CHLORIDE 0.9 % IV SOLN: INTRAVENOUS | Status: AC

## 2023-06-16 MED ORDER — SODIUM CHLORIDE 0.9% FLUSH: 3.0000 mL | INTRAVENOUS | Status: DC | PRN

## 2023-06-16 MED ORDER — VERAPAMIL HCL 2.5 MG/ML IV SOLN
INTRAVENOUS | Status: DC | PRN
  Administered 2023-06-16: 10 mL via INTRA_ARTERIAL

## 2023-06-16 MED ORDER — MIDAZOLAM HCL 2 MG/2ML IJ SOLN
INTRAMUSCULAR | Status: AC
  Filled 2023-06-16: qty 2

## 2023-06-16 MED ORDER — SODIUM CHLORIDE 0.9 % IV SOLN: 250.0000 mL | INTRAVENOUS | Status: DC | PRN

## 2023-06-16 MED ORDER — SODIUM CHLORIDE 0.9 % WEIGHT BASED INFUSION
1.0000 mL/kg/h | INTRAVENOUS | Status: DC
Start: 2023-06-16 — End: 2023-06-16

## 2023-06-16 MED ORDER — ASPIRIN 81 MG PO CHEW: 81.0000 mg | CHEWABLE_TABLET | ORAL | Status: DC

## 2023-06-16 MED ORDER — HEPARIN (PORCINE) IN NACL 1000-0.9 UT/500ML-% IV SOLN
INTRAVENOUS | Status: DC | PRN
  Administered 2023-06-16 (×2): 500 mL

## 2023-06-16 MED ORDER — ONDANSETRON HCL 4 MG/2ML IJ SOLN: 4.0000 mg | Freq: Four times a day (QID) | INTRAMUSCULAR | Status: DC | PRN

## 2023-06-16 MED ORDER — METOPROLOL TARTRATE 50 MG PO TABS
50.0000 mg | ORAL_TABLET | Freq: Once | ORAL | 0 refills | Status: DC
  Filled 2023-06-16: qty 1, 1d supply, fill #0

## 2023-06-16 MED ORDER — SODIUM CHLORIDE 0.9% FLUSH: 3.0000 mL | Freq: Two times a day (BID) | INTRAVENOUS | Status: DC

## 2023-06-16 MED ORDER — HEPARIN SODIUM (PORCINE) 1000 UNIT/ML IJ SOLN
INTRAMUSCULAR | Status: DC | PRN
  Administered 2023-06-16: 4000 [IU] via INTRAVENOUS

## 2023-06-16 MED ORDER — MIDAZOLAM HCL 2 MG/2ML IJ SOLN
INTRAMUSCULAR | Status: DC | PRN
  Administered 2023-06-16: 1 mg via INTRAVENOUS

## 2023-06-16 MED ORDER — FENTANYL CITRATE (PF) 100 MCG/2ML IJ SOLN
INTRAMUSCULAR | Status: AC
  Filled 2023-06-16: qty 2

## 2023-06-16 MED ORDER — IOHEXOL 350 MG/ML SOLN
INTRAVENOUS | Status: DC | PRN
  Administered 2023-06-16: 35 mL via INTRA_ARTERIAL

## 2023-06-16 MED ORDER — FENTANYL CITRATE (PF) 100 MCG/2ML IJ SOLN
INTRAMUSCULAR | Status: DC | PRN
  Administered 2023-06-16: 25 ug via INTRAVENOUS

## 2023-06-16 MED ORDER — LABETALOL HCL 5 MG/ML IV SOLN: 10.0000 mg | INTRAVENOUS | Status: DC | PRN

## 2023-06-16 MED ORDER — VERAPAMIL HCL 2.5 MG/ML IV SOLN
INTRAVENOUS | Status: AC
  Filled 2023-06-16: qty 2

## 2023-06-16 MED ORDER — HYDRALAZINE HCL 20 MG/ML IJ SOLN: 10.0000 mg | INTRAMUSCULAR | Status: DC | PRN

## 2023-06-16 MED ORDER — LIDOCAINE HCL (PF) 1 % IJ SOLN
INTRAMUSCULAR | Status: DC | PRN
  Administered 2023-06-16: 10 mL

## 2023-06-16 SURGICAL SUPPLY — 10 items
CATH 5FR JL3.5 JR4 ANG PIG MP (CATHETERS) IMPLANT
CATH BALLN WEDGE 5F 110CM (CATHETERS) IMPLANT
DEVICE RAD TR BAND REGULAR (VASCULAR PRODUCTS) IMPLANT
GLIDESHEATH SLEND SS 6F .021 (SHEATH) IMPLANT
GUIDEWIRE INQWIRE 1.5J.035X260 (WIRE) IMPLANT
INQWIRE 1.5J .035X260CM (WIRE) ×1
PACK CARDIAC CATHETERIZATION (CUSTOM PROCEDURE TRAY) ×1 IMPLANT
SET ATX-X65L (MISCELLANEOUS) IMPLANT
SHEATH GLIDE SLENDER 4/5FR (SHEATH) IMPLANT
WIRE HI TORQ VERSACORE-J 145CM (WIRE) IMPLANT

## 2023-06-16 NOTE — Progress Notes (Addendum)
Pre Surgical Assessment: 5 M Walk Test  54M=16.79ft  5 Meter Walk Test- trial 1: 3.86 seconds 5 Meter Walk Test- trial 2: 3.91 seconds 5 Meter Walk Test- trial 3: 3.57 seconds 5 Meter Walk Test Average: 3.78 seconds   STS surgical risk calculation:  Procedure Type: Isolated AVR Perioperative Outcome Estimate % Operative Mortality 2.16% Morbidity & Mortality 7.88% Stroke 1.28% Renal Failure 1.5% Reoperation 2.75% Prolonged Ventilation 4.49% Deep Sternal Wound Infection 0.06% Long Hospital Stay (>14 days) 3.45% Short Hospital Stay (<6 days)* 45.6%

## 2023-06-16 NOTE — Interval H&P Note (Signed)
History and Physical Interval Note:  06/16/2023 10:12 AM  [name]  has presented today for surgery, with the diagnosis of aortic stenosis.  The various methods of treatment have been discussed with the patient and family. After consideration of risks, benefits and other options for treatment, the patient has consented to  Procedure(s): RIGHT/LEFT HEART CATH AND CORONARY ANGIOGRAPHY (N/A) as a surgical intervention.  The patient's history has been reviewed, patient examined, no change in status, stable for surgery.  I have reviewed the patient's chart and labs.  Questions were answered to the patient's satisfaction.    Cath Lab Visit (complete for each Cath Lab visit)  Clinical Evaluation Leading to the Procedure:   ACS: No.  Non-ACS:    Anginal Classification: No Symptoms  Anti-ischemic medical therapy: Minimal Therapy (1 class of medications)  Non-Invasive Test Results: No non-invasive testing performed  Prior CABG: No previous CABG        Verne Carrow

## 2023-06-16 NOTE — Patient Instructions (Signed)
Medication Instructions:  No changes today *If you need a refill on your cardiac medications before your next appointment, please call your pharmacy*   Lab Work: None - labs at cath lab today If you have labs (blood work) drawn today and your tests are completely normal, you will receive your results only by: MyChart Message (if you have MyChart) OR A paper copy in the mail If you have any lab test that is abnormal or we need to change your treatment, we will call you to review the results.   Testing/Procedures: Cath today as planned - called to cath lab scheduling and let them know patient is on the way   Follow-Up: Per Structural Heart Team

## 2023-06-16 NOTE — Progress Notes (Signed)
  [facility]   MULTIDISCIPLINARY HEART VALVE TEAM  Patient underwent pre TAVR cath today with labs draw this AM. K+ returned at 2.9 and replaced intra-procedure with KCL per Dr. Clifton James. Appears her baseline is in the 3.5-4.5 range. Patient not taking diuretics.   Georgie Chard NP-C Structural Heart Team  Pager: [phone] Phone: [phone]

## 2023-06-16 NOTE — Discharge Instructions (Signed)
Drink plenty of fluids for 48 hours and keep wrist elevated at heart level for 24 hours  Radial Site Care   This sheet gives you information about how to care for yourself after your procedure. Your health care provider may also give you more specific instructions. If you have problems or questions, contact your health care provider. What can I expect after the procedure? After the procedure, it is common to have: Bruising and tenderness at the catheter insertion area. Follow these instructions at home: Medicines Take over-the-counter and prescription medicines only as told by your health care provider. Insertion site care Follow instructions from your health care provider about how to take care of your insertion site. Make sure you: Wash your hands with soap and water before you change your bandage (dressing). If soap and water are not available, use hand sanitizer. Remove your dressing TOMORROW AT 3:00 PM Check your insertion site every day for signs of infection. Check for: Redness, swelling, or pain. Fluid or blood. Pus or a bad smell. Warmth. Do not take baths, swim, or use a hot tub until your health care provider approves. You may shower AFTER REMOVAL OF DRESSING ALLOW WATER TO JUST RUN OVER THE SITE Pat the area dry with a clean towel. Do not rub the site. That could cause bleeding. Do not apply powder or lotion to the site. Activity   For 24 hours after the procedure, or as directed by your health care provider: Do not flex or bend the affected arm. Do not push or pull heavy objects with the affected arm. Do not drive yourself home from the hospital or clinic. You may drive 24 hours after the procedure unless your health care provider tells you not to. Do not operate machinery or power tools. Do not lift anything that is heavier than 10 lb (4.5 kg), or the limit that you are told, until your health care provider says that it is safe.  For 4 days Ask your health care  provider when it is okay to: Return to work or school. Resume usual physical activities or sports. Resume sexual activity. General instructions If the catheter site starts to bleed, raise your arm and put firm pressure on the site FOR 15 MINUTES. If the bleeding does not stop, get help right away. This is a medical emergency. If you went home on the same day as your procedure, a responsible adult should be with you for the first 24 hours after you arrive home. Keep all follow-up visits as told by your health care provider. This is important. Contact a health care provider if: You have a fever. You have redness, swelling, or yellow drainage around your insertion site. Get help right away if: You have unusual pain at the radial site. The catheter insertion area swells very fast. The insertion area is bleeding, and the bleeding does not stop when you hold steady pressure on the area FOR 15 MINUTES. Your arm or hand becomes pale, cool, tingly, or numb. These symptoms may represent a serious problem that is an emergency. Do not wait to see if the symptoms will go away. Get medical help right away. Call your local emergency services (911 in the [place] Do not drive yourself to the hospital. Summary After the procedure, it is common to have bruising and tenderness at the site. Follow instructions from your health care provider about how to take care of your radial site wound. Check the wound every day for signs of infection. Do not  lift anything that is heavier than 10 lb (4.5 kg), or the limit that you are told, until your health care provider says that it is safe. This information is not intended to replace advice given to you by your health care provider. Make sure you discuss any questions you have with your health care provider. Document Revised: 08/31/2017 Document Reviewed: 08/31/2017 Elsevier Patient Education  2020 [employer].

## 2023-06-17 ENCOUNTER — Encounter (HOSPITAL_COMMUNITY): Payer: Self-pay | Admitting: Cardiovascular Disease

## 2023-06-17 ENCOUNTER — Ambulatory Visit (HOSPITAL_COMMUNITY)
Admission: RE | Admit: 2023-06-17 | Discharge: 2023-06-17 | Disposition: A | Discharge status: Home / Self Care | Payer: Medicare Other | Source: Ambulatory Visit | Attending: Nurse Practitioner | Admitting: Nurse Practitioner

## 2023-06-17 DIAGNOSIS — I350 Nonrheumatic aortic (valve) stenosis: Secondary | ICD-10-CM | POA: Diagnosis present

## 2023-06-17 MED ORDER — IOHEXOL 350 MG/ML SOLN
100.0000 mL | Freq: Once | INTRAVENOUS | Status: AC | PRN
Start: 2023-06-17 — End: 2023-06-17
  Administered 2023-06-17: 100 mL via INTRAVENOUS

## 2023-06-20 ENCOUNTER — Institutional Professional Consult (permissible substitution) (INDEPENDENT_AMBULATORY_CARE_PROVIDER_SITE_OTHER): Payer: Medicare Other | Admitting: Surgery

## 2023-06-20 ENCOUNTER — Encounter: Payer: Self-pay | Admitting: Surgery

## 2023-06-20 VITALS — BP 134/79 | HR 77 | Resp 18 | Ht 63.0 in | Wt 178.0 lb

## 2023-06-20 DIAGNOSIS — I350 Nonrheumatic aortic (valve) stenosis: Secondary | ICD-10-CM | POA: Diagnosis not present

## 2023-06-22 NOTE — Progress Notes (Signed)
Patient ID: Samantha Davidson, female   DOB: 28-Aug-1945, 77 y.o.   MRN: [id number]  [facility]       [address]       [name] [address]             [phone]          CARDIOTHORACIC SURGERY CONSULTATION REPORT  PCP is Gerre Scull, NP Referring Provider is Alverda Skeans, MD Primary Cardiologist is Christell Constant, MD  Reason for consultation:  Severe aortic stenosis  HPI:  The patient is a 77 year old woman with a history of hypertension, hyperlipidemia, asthma, and aortic stenosis who was referred for consideration of TAVR.  She was previously living in [place] and followed by cardiology there for aortic stenosis.  She was living in a 4 story house there and had a fall down steps sustaining a cervical neck fracture that required surgery a few years ago.  She said this occurred when she tripped while walking up stairs.  She subsequently sold that house and moved to [facility] [place] but comes to [facility] for her medical care since she had previously lived in [place] for many years before moving to [place].  Her most recent echo on 06/02/2023 showed a calcified and thickened aortic valve with a mean gradient of 39 mmHg and a peak gradient of 69 mmHg.  Aortic valve area by VTI was 1.07 cm.  Left ventricular ejection fraction was 60 to 65% with grade 1 diastolic dysfunction.  She reports progressive exertional shortness of breath and fatigue that seems to be worsening over the past few months.  It is now limiting her daily activities.  She has been having frequent dizziness when changing positions.  She denies any syncope.  She denies any chest discomfort.  She has had no peripheral edema.  She has had some recent shortness of breath when laying in bed and sleeps up on a couple pillows now.  She has a history of asthma but has been relatively stable for the past several years.  She was  scheduled for cardiac catheterization on 06/10/2023 but developed an upper respiratory infection that she thought was a bad cold.  The procedure was rescheduled for 06/16/2023.  Her upper respiratory symptoms subsequently completely resolved and she now feels at her baseline.  She denies any fever or chills.  She has had no cough.  Her CTA of the chest on 06/17/2023 showed no lung consolidation or pleural effusion.  There were a few small lung nodules that will require follow-up.  She is here today by herself.  She lives alone at [facility] but has a niece in [place] that she stays with when she comes down here.  She has 2 sons who live out of town.  One is a hospitalist. Past Medical History:  Diagnosis Date   Allergy    Asthma    Depression    GERD (gastroesophageal reflux disease)    History of DVT of lower extremity    Hypertension    Lymphocytic colitis    Osteoporosis    Rectal polyp    Tubular adenoma of colon     Past Surgical History:  Procedure Laterality Date   COLONOSCOPY     FOOT SURGERY  08/09/1986   pin in both great toes   HAND SURGERY Left    RIGHT HEART CATH AND CORONARY ANGIOGRAPHY N/A 06/16/2023   Procedure: RIGHT HEART CATH AND  CORONARY ANGIOGRAPHY;  Surgeon: Kathleene Hazel, MD;  Location: [facility];  Service: Cardiovascular;  Laterality: N/A;    Family History  Problem Relation Age of Onset   Diabetes Mother    Lung cancer Mother    Heart disease Mother    Mental illness Mother    Alzheimer's disease Mother    Cancer Father    Arthritis Maternal Grandmother     Social History   Socioeconomic History   Marital status: Single    Spouse name: Not on file   Number of children: 2   Years of education: Not on file   Highest education level: Bachelor's degree (e.g., BA, AB, BS)  Occupational History   Occupation: Comptroller: [employer]    Employer: [employer]  Tobacco Use   Smoking  status: Former   Smokeless tobacco: Never   Tobacco comments:    only when stressed  Vaping Use   Vaping status: Never Used  Substance and Sexual Activity   Alcohol use: Not Currently    Comment: wine on occasion   Drug use: No   Sexual activity: Not Currently  Other Topics Concern   Not on file  Social History Narrative   Not on file   Social Determinants of Health   Financial Resource Strain: Low Risk  (02/14/2023)   Overall Financial Resource Strain (CARDIA)    Difficulty of Paying Living Expenses: Not hard at all  Food Insecurity: No Food Insecurity (02/14/2023)   Hunger Vital Sign    Worried About Running Out of Food in the Last Year: Never true    Ran Out of Food in the Last Year: Never true  Transportation Needs: No Transportation Needs (02/14/2023)   PRAPARE - Administrator, Civil Service (Medical): No    Lack of Transportation (Non-Medical): No  Physical Activity: Insufficiently Active (02/14/2023)   Exercise Vital Sign    Days of Exercise per Week: 2 days    Minutes of Exercise per Session: 30 min  Stress: No Stress Concern Present (02/14/2023)   [employer] of Occupational Health - Occupational Stress Questionnaire    Feeling of Stress : Not at all  Social Connections: Unknown (02/14/2023)   Social Connection and Isolation Panel [NHANES]    Frequency of Communication with Friends and Family: Not on file    Frequency of Social Gatherings with Friends and Family: Not on file    Attends Religious Services: Never    Active Member of Clubs or Organizations: No    Attends Banker Meetings: Not on file    Marital Status: Widowed  Intimate Partner Violence: Not on file    Prior to Admission medications   Medication Sig Start Date End Date Taking? Authorizing Provider  albuterol (VENTOLIN HFA) 108 (90 Base) MCG/ACT inhaler Inhale 1-2 puffs into the lungs every 6 (six) hours as needed for wheezing or shortness of breath. 02/17/23  Yes McElwee,  Lauren A, NP  alendronate (FOSAMAX) 70 MG tablet Take 1 tablet (70 mg total) by mouth once a week. 02/17/23  Yes McElwee, Lauren A, NP  amLODipine (NORVASC) 10 MG tablet Take 10 mg by mouth daily. 05/10/23  Yes [provider]  aspirin 81 MG EC tablet Take 81 mg by mouth daily.     Yes [provider]  atorvastatin (LIPITOR) 40 MG tablet Take 1 tablet (40 mg total) by mouth daily. 05/12/23  Yes Christell Constant, MD  buPROPion (WELLBUTRIN XL) 300 MG 24 hr tablet 1 po qd 01/30/19  Yes Lowne Florina Ou R, DO  chlorthalidone (HYGROTON) 25 MG tablet Take 1 tablet (25 mg total) by mouth daily. 02/17/23  Yes McElwee, Lauren A, NP  cholecalciferol (VITAMIN D3) 25 MCG (1000 UNIT) tablet Take 1,000 Units by mouth daily.   Yes [provider]  citalopram (CELEXA) 20 MG tablet Take 1 tablet (20 mg total) by mouth daily. 02/17/23  Yes McElwee, Lauren A, NP  EPINEPHrine (EPIPEN) 0.3 mg/0.3 mL DEVI Inject 0.3 mLs (0.3 mg total) into the muscle once. 05/17/11  Yes Seabron Spates R, DO  ezetimibe (ZETIA) 10 MG tablet Take 1 tablet (10 mg total) by mouth daily. 05/12/23  Yes Chandrasekhar, Mahesh A, MD  famotidine (PEPCID) 40 MG tablet Take 1 tablet (40 mg total) by mouth at bedtime. Patient taking differently: Take 40 mg by mouth daily as needed (Asthma). 02/17/23  Yes McElwee, Lauren A, NP  fluticasone-salmeterol (ADVAIR) 100-50 MCG/ACT AEPB Inhale 1 puff into the lungs 2 (two) times daily. Patient taking differently: Inhale 1 puff into the lungs 2 (two) times daily as needed (Asthma). 02/17/23  Yes McElwee, Lauren A, NP  fluticasone-salmeterol (WIXELA INHUB) 100-50 MCG/ACT AEPB Inhale 1 puff into the lungs 2 (two) times daily.   Yes [provider]  ibuprofen (ADVIL,MOTRIN) 800 MG tablet TAKE 1 TABLET BY MOUTH EVERY 6-8 HOURS AS NEEDED FOR PAIN 11/23/17  Yes [provider]  loratadine (CLARITIN) 10 MG tablet Take 10 mg by mouth daily.   Yes [provider]   Multiple Vitamin (MULTIVITAMIN PO) Take 1 tablet by mouth daily.     Yes [provider]  valsartan (DIOVAN) 40 MG tablet Take 1 tablet (40 mg total) by mouth daily. 02/17/23  Yes McElwee, Lauren A, NP  metoprolol tartrate (LOPRESSOR) 50 MG tablet Take 1 tablet (50 mg total) by mouth prior to 11/08 CT scan. 06/15/23 06/17/23  Janetta Hora, PA-C    Current Outpatient Medications  Medication Sig Dispense Refill   albuterol (VENTOLIN HFA) 108 (90 Base) MCG/ACT inhaler Inhale 1-2 puffs into the lungs every 6 (six) hours as needed for wheezing or shortness of breath. 8 g 6   alendronate (FOSAMAX) 70 MG tablet Take 1 tablet (70 mg total) by mouth once a week. 12 tablet 3   amLODipine (NORVASC) 10 MG tablet Take 10 mg by mouth daily.     aspirin 81 MG EC tablet Take 81 mg by mouth daily.       atorvastatin (LIPITOR) 40 MG tablet Take 1 tablet (40 mg total) by mouth daily. 90 tablet 3   buPROPion (WELLBUTRIN XL) 300 MG 24 hr tablet 1 po qd 90 tablet 1   chlorthalidone (HYGROTON) 25 MG tablet Take 1 tablet (25 mg total) by mouth daily. 90 tablet 3   cholecalciferol (VITAMIN D3) 25 MCG (1000 UNIT) tablet Take 1,000 Units by mouth daily.     citalopram (CELEXA) 20 MG tablet Take 1 tablet (20 mg total) by mouth daily. 90 tablet 3   EPINEPHrine (EPIPEN) 0.3 mg/0.3 mL DEVI Inject 0.3 mLs (0.3 mg total) into the muscle once. 1 Device 1   ezetimibe (ZETIA) 10 MG tablet Take 1 tablet (10 mg total) by mouth daily. 90 tablet 3   famotidine (PEPCID) 40 MG tablet Take 1 tablet (40 mg total) by mouth at bedtime. (Patient taking differently: Take 40 mg by mouth daily as needed (Asthma).) 90 tablet 3   fluticasone-salmeterol (  ADVAIR) 100-50 MCG/ACT AEPB Inhale 1 puff into the lungs 2 (two) times daily. (Patient taking differently: Inhale 1 puff into the lungs 2 (two) times daily as needed (Asthma).) 60 each 6   fluticasone-salmeterol (WIXELA INHUB) 100-50 MCG/ACT AEPB Inhale 1 puff into the lungs 2 (two)  times daily.     ibuprofen (ADVIL,MOTRIN) 800 MG tablet TAKE 1 TABLET BY MOUTH EVERY 6-8 HOURS AS NEEDED FOR PAIN  2   loratadine (CLARITIN) 10 MG tablet Take 10 mg by mouth daily.     Multiple Vitamin (MULTIVITAMIN PO) Take 1 tablet by mouth daily.       valsartan (DIOVAN) 40 MG tablet Take 1 tablet (40 mg total) by mouth daily. 90 tablet 3   metoprolol tartrate (LOPRESSOR) 50 MG tablet Take 1 tablet (50 mg total) by mouth prior to 11/08 CT scan. 1 tablet 0   No current facility-administered medications for this visit.    Allergies  Allergen Reactions   Codeine Nausea And Vomiting   Doxycycline Diarrhea      Review of Systems:   General:  normal appetite, + decreased energy, no weight gain, no weight loss, no fever  Cardiac:  no chest pain with exertion, no chest pain at rest, + SOB with mild exertion, no resting SOB, no PND, + orthopnea, no palpitations, no arrhythmia, no atrial fibrillation, no LE edema, + dizzy spells, no syncope  Respiratory:  + exertional shortness of breath, no home oxygen, no productive cough, no dry cough, no bronchitis, no wheezing, no hemoptysis, + asthma, no pain with inspiration or cough, no sleep apnea, no CPAP at night  GI:   no difficulty swallowing, no reflux, no frequent heartburn, no hiatal hernia, n abdominal pain, ono constipation, no diarrhea, no hematochezia, no hematemesis, no melena  GU:   no dysuria,  no frequency, no urinary tract infection, no hematuria, no kidney stones, nokidney disease  Vascular:  no pain suggestive of claudication, no pain in feet, no leg cramps, no varicose veins, no DVT, no non-healing foot ulcer  Neuro:   no stroke, no TIA's, no seizures, no headaches, no temporary blindness one eye,  no slurred speech, no peripheral neuropathy, no chronic pain, no instability of gait, no memory/cognitive dysfunction  Musculoskeletal: no arthritis, no joint swelling, no myalgias, no difficulty walking, normal mobility   Skin:   no rash,  no itching, no skin infections, no pressure sores or ulcerations  Psych:   no anxiety, no depression, no nervousness, no unusual recent stress  Eyes:   no blurry vision, no floaters, no recent vision changes, no wears glasses or contacts  ENT:   no hearing loss, no loose or painful teeth, no dentures, last saw dentist this year  Hematologic:  no easy bruising, no abnormal bleeding, n clotting disorder, ono frequent epistaxis  Endocrine:  no diabetes, does not check CBG's at home     Physical Exam:   BP 134/79 (BP Location: Left Arm, Patient Position: Sitting)   Pulse 77   Resp 18   Ht 5\' 3"  (1.6 m)   Wt 178 lb (80.7 kg)   SpO2 98% Comment: RA  BMI 31.53 kg/m   General:  well-appearing  HEENT:  Unremarkable, NCAT, PERLA, EOMI  Neck:   no JVD, no bruits, no adenopathy   Chest:   clear to auscultation, symmetrical breath sounds, no wheezes, no rhonchi  CV:   RRR, 3/6 systolic murmur RSB, no diastolic murmur  Abdomen:  soft, non-tender, no masses  Extremities:  warm, well-perfused, pedal pulses palpable, no lower extremity edema  Rectal/GU  Deferred  Neuro:   Grossly non-focal and symmetrical throughout  Skin:   Clean and dry, no rashes, no breakdown  Diagnostic Tests:  ECHOCARDIOGRAM REPORT       Patient Name:   Samantha Davidson Date of Exam: 06/02/2023  Medical Rec #:  [id number]               Height:       63.5 in  Accession #:    [id number]              Weight:       183.0 lb  Date of Birth:  17-Jul-1946               BSA:          1.873 m  Patient Age:    77 years                BP:           116/68 mmHg  Patient Gender: F                       HR:           88 bpm.  Exam Location:  [address]   Procedure: 2D Echo, Cardiac Doppler, Color Doppler, 3D Echo and Strain  Analysis   Indications:   I35.0 Nonrheumatic aortic (valve) stenosis; R01.1 Murmur    History:        Patient has no prior history of Echocardiogram  examinations.                 Risk  Factors:Hypertension and Dyslipidemia. Bilateral  carotid                 artery stenosis.    Sonographer:    Cathie Beams RCS  Referring Phys: [id number] [facility] A CHANDRASEKHAR   IMPRESSIONS     1. Left ventricular ejection fraction, by estimation, is 60 to 65%. The  left ventricle has normal function. The left ventricle has no regional  wall motion abnormalities. Left ventricular diastolic parameters are  consistent with Grade I diastolic  dysfunction (impaired relaxation). The average left ventricular global  longitudinal strain is -22.4 %. The global longitudinal strain is normal.   2. Right ventricular systolic function is normal. The right ventricular  size is normal. There is normal pulmonary artery systolic pressure. The  estimated right ventricular systolic pressure is 25.1 mmHg.   3. The mitral valve is degenerative. No evidence of mitral valve  regurgitation. No evidence of mitral stenosis. Moderate mitral annular  calcification.   4. The aortic valve is calcified. Aortic valve regurgitation is not  visualized. Severe aortic valve stenosis. Aortic valve area, by VTI  measures 1.07 cm. Aortic valve mean gradient measures 39.0 mmHg. Aortic  valve Vmax measures 4.16 m/s. DVI 0.34.   5. The inferior vena cava is normal in size with greater than 50%  respiratory variability, suggesting right atrial pressure of 3 mmHg.   FINDINGS   Left Ventricle: Left ventricular ejection fraction, by estimation, is 60  to 65%. The left ventricle has normal function. The left ventricle has no  regional wall motion abnormalities. The average left ventricular global  longitudinal strain is -22.4 %.  The global longitudinal strain is normal. The left ventricular internal  cavity size was normal in size. There is no left ventricular hypertrophy.  Left ventricular  diastolic parameters are consistent with Grade I  diastolic dysfunction (impaired  relaxation). Normal left ventricular filling  pressure.   Right Ventricle: The right ventricular size is normal. No increase in  right ventricular wall thickness. Right ventricular systolic function is  normal. There is normal pulmonary artery systolic pressure. The tricuspid  regurgitant velocity is 2.35 m/s, and   with an assumed right atrial pressure of 3 mmHg, the estimated right  ventricular systolic pressure is 25.1 mmHg.   Left Atrium: Left atrial size was normal in size.   Right Atrium: Right atrial size was normal in size.   Pericardium: There is no evidence of pericardial effusion.   Mitral Valve: The mitral valve is degenerative in appearance. There is  mild thickening of the mitral valve leaflet(s). There is mild  calcification of the mitral valve leaflet(s). Moderate mitral annular  calcification. No evidence of mitral valve  regurgitation. No evidence of mitral valve stenosis.   Tricuspid Valve: The tricuspid valve is normal in structure. Tricuspid  valve regurgitation is mild . No evidence of tricuspid stenosis.   Aortic Valve: The aortic valve is calcified. Aortic valve regurgitation is  not visualized. Severe aortic stenosis is present. Aortic valve mean  gradient measures 39.0 mmHg. Aortic valve peak gradient measures 69.2  mmHg. Aortic valve area, by VTI measures   1.07 cm.   Pulmonic Valve: The pulmonic valve was normal in structure. Pulmonic valve  regurgitation is mild. No evidence of pulmonic stenosis.   Aorta: The aortic root is normal in size and structure.   Venous: The inferior vena cava is normal in size with greater than 50%  respiratory variability, suggesting right atrial pressure of 3 mmHg.   IAS/Shunts: No atrial level shunt detected by color flow Doppler.     LEFT VENTRICLE  PLAX 2D  LVIDd:         3.80 cm   Diastology  LVIDs:         2.10 cm   LV e' medial:    7.51 cm/s  LV PW:         0.70 cm   LV E/e' medial:  12.5  LV IVS:        1.00 cm   LV e' lateral:   8.27 cm/s  LVOT  diam:     2.00 cm   LV E/e' lateral: 11.3  LV SV:         95  LV SV Index:   51        2D Longitudinal Strain  LVOT Area:     3.14 cm  2D Strain GLS (A2C):   -21.5 %                           2D Strain GLS (A3C):   -26.0 %                           2D Strain GLS (A4C):   -19.7 %                           2D Strain GLS Avg:     -22.4 %   RIGHT VENTRICLE  RV Basal diam:  2.90 cm  RV S prime:     14.50 cm/s  TAPSE (M-mode): 2.5 cm  RVSP:           25.1 mmHg   LEFT  ATRIUM             Index        RIGHT ATRIUM           Index  LA diam:        3.40 cm 1.82 cm/m   RA Pressure: 3.00 mmHg  LA Vol (A2C):   58.5 ml 31.23 ml/m  RA Area:     12.00 cm  LA Vol (A4C):   55.8 ml 29.79 ml/m  RA Volume:   27.70 ml  14.79 ml/m  LA Biplane Vol: 58.3 ml 31.13 ml/m   AORTIC VALVE  AV Area (Vmax):    1.03 cm  AV Area (Vmean):   1.03 cm  AV Area (VTI):     1.07 cm  AV Vmax:           416.00 cm/s  AV Vmean:          292.500 cm/s  AV VTI:            0.887 m  AV Peak Grad:      69.2 mmHg  AV Mean Grad:      39.0 mmHg  LVOT Vmax:         136.00 cm/s  LVOT Vmean:        95.500 cm/s  LVOT VTI:          0.303 m  LVOT/AV VTI ratio: 0.34    AORTA  Ao Root diam: 3.20 cm  Ao Asc diam:  3.00 cm   MITRAL VALVE                TRICUSPID VALVE  MV Area (PHT): 2.23 cm     TR Peak grad:   22.1 mmHg  MV Decel Time: 340 msec     TR Vmax:        235.00 cm/s  MV E velocity: 93.80 cm/s   Estimated RAP:  3.00 mmHg  MV A velocity: 138.00 cm/s  RVSP:           25.1 mmHg  MV E/A ratio:  0.68                              SHUNTS                              Systemic VTI:  0.30 m                              Systemic Diam: 2.00 cm   Armanda Magic MD  Electronically signed by Armanda Magic MD  Signature Date/Time: 06/02/2023/3:31:10 PM        Final     Physicians  Panel Physicians Referring Physician Case Authorizing Physician  Kathleene Hazel, MD (Primary)     Procedures  RIGHT HEART CATH AND  CORONARY ANGIOGRAPHY   Conclusion      Prox RCA lesion is 20% stenosed.   Mid Cx lesion is 30% stenosed.   Mid LAD lesion is 40% stenosed.   Mild non-obstructive coronary artery disease Normal right heart pressures    Recommendations: Continue workup for TAVR. Medical management of mild CAD   Indications  Severe aortic stenosis [I35.0 (ICD-10-CM)]   Procedural Details  Technical Details Indication: 77 yo female with severe aortic stenosis, workup for TAVR  Procedure: The risks, benefits, complications, treatment  options, and expected outcomes were discussed with the patient. The patient and/or family concurred with the proposed plan, giving informed consent. The patient was sedated with Versed and Fentanyl. The IV catheter in the right antecubital vein was changed for a 5 [place] sheath. Right heart catheterization performed with a balloon tipped catheter. The right wrist was prepped and draped in a sterile fashion. 1% lidocaine was used for local anesthesia. Using the modified Seldinger access technique, a 5 French sheath was placed in the right radial artery. 3 mg Verapamil was given through the sheath. Weight based IV heparin was given. Standard diagnostic catheters were used to perform selective coronary angiography. I did not cross the aortic valve. All catheter exchanges were performed over an exchange length guidewire.   The sheath was removed from the right radial artery and a hemostasis band was applied at the arteriotomy site on the right wrist.      Estimated blood loss <50 mL.   During this procedure medications were administered to achieve and maintain moderate conscious sedation while the patient's heart rate, blood pressure, and oxygen saturation were continuously monitored and I was present face-to-face 100% of this time.   Medications (Filter: Administrations occurring from 1213 to 1253 on 06/16/23) fentaNYL (SUBLIMAZE) injection (mcg)  Total dose: 25  mcg Date/Time Rate/Dose/Volume Action   06/16/23 1228 25 mcg Given   midazolam (VERSED) injection (mg)  Total dose: 1 mg Date/Time Rate/Dose/Volume Action   06/16/23 1228 1 mg Given   lidocaine (PF) (XYLOCAINE) 1 % injection (mL)  Total volume: 10 mL Date/Time Rate/Dose/Volume Action   06/16/23 1233 10 mL Given   Heparin (Porcine) in NaCl 1000-0.9 UT/500ML-% SOLN (mL)  Total volume: 1,000 mL Date/Time Rate/Dose/Volume Action   06/16/23 1234 500 mL Given   1234 500 mL Given   Radial Cocktail/Verapamil only (mL)  Total volume: 10 mL Date/Time Rate/Dose/Volume Action   06/16/23 1238 10 mL Given   heparin sodium (porcine) injection (Units)  Total dose: 4,000 Units Date/Time Rate/Dose/Volume Action   06/16/23 1242 4,000 Units Given   iohexol (OMNIPAQUE) 350 MG/ML injection (mL)  Total volume: 35 mL Date/Time Rate/Dose/Volume Action   06/16/23 1247 35 mL Given    Sedation Time  Sedation Time Physician-1: 20 minutes 5 seconds Contrast     Administrations occurring from 1213 to 1253 on 06/16/23:  Medication Name Total Dose  iohexol (OMNIPAQUE) 350 MG/ML injection 35 mL   Radiation/Fluoro  Fluoro time: 4.4 (min) DAP: 13425 (mGycm2) Cumulative Air Kerma: 250 (mGy) Complications  Complications documented before study signed (06/16/2023 12:55 PM)   No complications were associated with this study.  Documented by Earlean Polka, RT - 06/16/2023 12:52 PM     Coronary Findings  Diagnostic Dominance: Right Left Anterior Descending  Vessel is large.  Mid LAD lesion is 40% stenosed.    Left Circumflex  Mid Cx lesion is 30% stenosed.    Right Coronary Artery  Prox RCA lesion is 20% stenosed.    Intervention   No interventions have been documented.   Coronary Diagrams  Diagnostic Dominance: Right  Intervention   Implants   No implant documentation for this case.   Syngo Images   Show images for CARDIAC CATHETERIZATION Images on Long Term Storage   Show  images for Marit, [place] to Procedure Log  Procedure Log   Link to Procedure Log  Procedure Log    Hemo Data  Flowsheet Row Most Recent Value  Fick Cardiac Output 4.66 L/min  Fick  Cardiac Output Index 2.56 (L/min)/BSA  RA A Wave 10 mmHg  RA V Wave 7 mmHg  RA Mean 6 mmHg  RV Systolic Pressure 29 mmHg  RV Diastolic Pressure 0 mmHg  RV EDP 11 mmHg  PA Systolic Pressure 28 mmHg  PA Diastolic Pressure 7 mmHg  PA Mean 16 mmHg  PW A Wave 13 mmHg  PW V Wave 11 mmHg  PW Mean 8 mmHg  AO Systolic Pressure 129 mmHg  AO Diastolic Pressure 65 mmHg  AO Mean 89 mmHg  QP/QS 1  TPVR Index 6.26 HRUI  TSVR Index 34.81 HRUI  PVR SVR Ratio 0.1  TPVR/TSVR Ratio 0.18   Narrative & Impression  CLINICAL DATA:  Aortic valve replacement (TAVR), pre-op eval   EXAM: Cardiac TAVR CT   TECHNIQUE: The patient was scanned on a Siemens Force 192 slice scanner. A 120 kV retrospective scan was triggered in the descending thoracic aorta at 111 HU's. Gantry rotation speed was 270 msecs and collimation was .9 mm. The 3D data set was reconstructed in 5% intervals of the R-R cycle. Systolic and diastolic phases were analyzed on a dedicated work station using MPR, MIP and VRT modes. The patient received OMNIPAQUE IOHEXOL 350 MG/ML SOLN of contrast.   FINDINGS: Aortic Valve:   Tricuspid aortic valve with severely reduced cusp excursion. Severely thickened and severely calcified aortic valve cusps.   AV calcium score: 1711   Virtual Basal Annulus Measurements:   Maximum/Minimum Diameter: 25.3 x 21 mm   Perimeter: 73.4 mm   Area:  409 mm2   Focal LVOT calcifications under LCC.   Membranous septal length: 6 mm   Based on these measurements, the annulus would be suitable for a 23 mm Sapien 3 valve. Alternatively, Heart Team can consider 29 mm Evolut valve. Recommend Heart Team discussion for valve selection.   Sinus of Valsalva Measurements:   Non-coronary:  30 mm    Right - coronary:  29 mm   Left - coronary:  31 mm   Sinus of Valsalva Height:   Left: 18.7 mm   Right: 21.4 mm   Aorta: Conventional 3 vessel branch pattern of aortic arch.   Sinotubular Junction:  26 mm   Ascending Thoracic Aorta:  30 mm   Aortic Arch: 27 mm   Descending Thoracic Aorta:  23 mm   Coronary Artery Height above Annulus:   Left main: 14.3 mm   Right coronary: 20.2 mm   Coronary Arteries: Normal coronary origin. Right dominance. The study was performed without use of NTG and insufficient for plaque evaluation.   Optimum Fluoroscopic Angle for Delivery: RAO 0 CAU 14   OTHER:   Atria: Mild LA dilation   Left atrial appendage: No thrombus.   Mitral valve: Grossly normal   Pulmonary artery: Normal caliber.   Pulmonary veins: Normal anatomy.   IMPRESSION: 1. 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 Severely thickened and severely calcified aortic valve cusps. 2. Aortic valve calcium score: 1711 3. Annulus area: 409 mm2, suitable for 23 mm Sapien 3 valve. Mild LVOT calcifications. Membranous septal length 6mm. 4. Sufficient coronary artery heights from annulus. 5. Optimum fluoroscopic angle for delivery: RAO 0 CAU 14   Electronically Signed: By: Weston Brass M.D. On: 06/17/2023 14:54      Narrative & Impression  CLINICAL DATA:  Aortic valve replacement preop evaluation   EXAM: CT ANGIOGRAPHY CHEST, ABDOMEN AND PELVIS   TECHNIQUE: Non-contrast CT of the chest was initially obtained.  Multidetector CT imaging through the chest, abdomen and pelvis was performed using the standard protocol during bolus administration of intravenous contrast. Multiplanar reconstructed images and MIPs were obtained and reviewed to evaluate the vascular anatomy.   RADIATION DOSE REDUCTION: This exam was performed according to the departmental dose-optimization program which includes automated exposure control, adjustment of the mA  and/or kV according to patient size and/or use of iterative reconstruction technique.   CONTRAST:  OMNIPAQUE IOHEXOL 350 MG/ML SOLN   COMPARISON:  Chest CT dated April 9th 2019   FINDINGS: CTA CHEST FINDINGS   Cardiovascular: Normal heart size. No No pericardial effusion. Aortic valve thickening and calcifications. Three-vessel coronary artery calcifications. Mitral annular calcifications.   Mediastinum/Nodes: Small hiatal hernia. Thyroid is unremarkable. No No enlarged lymph nodes seen in the chest.   Lungs/Pleura: Central airways are patent. No consolidation, pleural effusion or pneumothorax. New part solid nodule of the right upper lobe measuring 6 mm, predominantly solid with mild amount of associated ground-glass, located on series 7, image 52. Additional part solid nodule of the lingula measuring 7 mm with 2 mm solid component on series 7, image 69, likely unchanged when compared with 2019 prior, although motion artifact on prior exam limits evaluation.   Musculoskeletal: No chest wall abnormality. No acute or significant osseous findings.   CTA ABDOMEN AND PELVIS FINDINGS   Hepatobiliary: No focal liver abnormality is seen. No gallstones. No biliary ductal dilation. Mild focal thickening of the gallbladder fundus which is likely due to adenomyomatosis.   Pancreas: Unremarkable. No pancreatic ductal dilatation or surrounding inflammatory changes.   Spleen: Normal in size without focal abnormality.   Adrenals/Urinary Tract: Bilateral adrenal glands are unremarkable. Hydronephrosis or nephrolithiasis. Bilateral low-attenuation renal lesions which are too small to accurately characterize but likely simple cysts, specific follow-up imaging is necessary. Bladder is unremarkable.   Stomach/Bowel: Stomach is within normal limits. Normal appendix. No evidence of bowel wall thickening, distention, or inflammatory changes.   Vascular/lymphatic: Normal caliber  thoracic aorta with mild atherosclerotic disease. No enlarged lymph nodes seen in the abdomen or pelvis.   Reproductive: Uterus and bilateral adnexa are unremarkable.   Other: No abdominal wall hernia or abnormality. No abdominopelvic ascites.   Musculoskeletal: No acute or significant osseous findings.   VASCULAR MEASUREMENTS PERTINENT TO TAVR:   AORTA:   Minimal Aortic Diameter-12.2 mm   Severity of Aortic Calcification-mild   RIGHT PELVIS:   Right Common Iliac Artery -   Minimal Diameter-8.7 mm   Tortuosity-moderate   Calcification-mild   Right External Iliac Artery -   Minimal Diameter-6.8 mm   Tortuosity-moderate   Calcification-none   Right Common Femoral Artery -   Minimal Diameter-7.2 mm   Tortuosity-none   Calcification-none   LEFT PELVIS:   Left Common Iliac Artery -   Minimal Diameter-8.6 mm   Tortuosity-mild   Calcification-none   Left External Iliac Artery -   Minimal Diameter-6.6 mm   Tortuosity-moderate   Calcification-none   Left Common Femoral Artery -   Minimal Diameter-6.9 mm   Tortuosity-mild   Calcification-none   Review of the MIP images confirms the above findings.   IMPRESSION: Vascular:   1. Vascular findings and measurements pertinent to potential TAVR procedure, as detailed above. 2. Thickening and calcification of the aortic valve, compatible with reported clinical history of aortic stenosis. 3. Mild aortoiliac atherosclerosis. Three vessel coronary artery disease.   Nonvascular:   1. New right upper lobe part solid nodule. Follow-up non-contrast CT recommended at 3-6  months to confirm persistence. If unchanged, and solid component remains <6 mm, annual CT is recommended until 5 years of stability has been established. If persistent these nodules should be considered highly suspicious if the solid component of the nodule is 6 mm or greater in size and enlarging. This recommendation follows the  consensus statement: Guidelines for Management of Incidental Pulmonary Nodules Detected on CT Images: From the Fleischner Society 2017; Radiology 2017; 284:228-243. 2. Additional small part solid nodule of the left upper lobe, likely unchanged when compared with 2019 prior. Recommend attention on follow-up.     Electronically Signed   By: Allegra Lai M.D.   On: 06/17/2023 13:05     Impression:  This 77 year old woman has stage D, severe, symptomatic aortic stenosis with NYHA class II symptoms of exertional fatigue and shortness of breath consistent with chronic diastolic congestive heart failure as well as frequent episodes of dizziness with changing positions.  I have personally reviewed her 2D echocardiogram, cardiac catheterization, and CTA studies.  Her echocardiogram shows a severely calcified and thickened aortic valve with restricted leaflet mobility with a mean gradient of 39 mmHg and a valve area 1.07 cm consistent with severe aortic stenosis.  Cardiac catheterization showed mild nonobstructive coronary disease with normal right heart pressures.  I agree that aortic valve replacement is indicated for relief of her progressive symptoms and to prevent left ventricular dysfunction.  Given her age I think transcatheter aortic valve replacement would be the best option for treating her.  Her gated cardiac CTA shows anatomy suitable for TAVR using a 23 mm Edwards SAPIEN 3 valve.  Her abdominal and pelvic CTA shows adequate pelvic vascular anatomy to allow transfemoral insertion.  The patient was counseled at length regarding treatment alternatives for management of severe symptomatic aortic stenosis. The risks and benefits of surgical intervention has been discussed in detail. Long-term prognosis with medical therapy was discussed. Alternative approaches such as conventional surgical aortic valve replacement, transcatheter aortic valve replacement, and palliative medical therapy were  compared and contrasted at length. This discussion was placed in the context of the patient's own specific clinical presentation and past medical history. All of her questions have been addressed.   Following the decision to proceed with transcatheter aortic valve replacement, a discussion was held regarding what types of management strategies would be attempted intraoperatively in the event of life-threatening complications, including whether or not the patient would be considered a candidate for the use of cardiopulmonary bypass and/or conversion to open sternotomy for attempted surgical intervention.  She is in overall good condition for her age and I think she would be a candidate for emergent sternotomy to manage any intraoperative complications.  The patient is aware of the fact that transient use of cardiopulmonary bypass may be necessary. The patient has been advised of a variety of complications that might develop including but not limited to risks of death, stroke, paravalvular leak, aortic dissection or other major vascular complications, aortic annulus rupture, device embolization, cardiac rupture or perforation, mitral regurgitation, acute myocardial infarction, arrhythmia, heart block or bradycardia requiring permanent pacemaker placement, congestive heart failure, respiratory failure, renal failure, pneumonia, infection, other late complications related to structural valve deterioration or migration, or other complications that might ultimately cause a temporary or permanent loss of functional independence or other long term morbidity. The patient provides full informed consent for the procedure as described and all questions were answered.   She had a recent upper respiratory infection at the end of October  that has completely recovered from that.    Plan:  She will be scheduled for transfemoral TAVR using a SAPIEN 3 valve on 07/05/2023.  I spent 60 minutes performing this consultation and  > 50% of this time was spent face to face counseling and coordinating the care of this patient's severe symptomatic aortic stenosis.   Alleen Borne, MD 06/20/2023

## 2023-06-24 ENCOUNTER — Other Ambulatory Visit: Payer: Self-pay

## 2023-06-24 DIAGNOSIS — I350 Nonrheumatic aortic (valve) stenosis: Secondary | ICD-10-CM

## 2023-07-04 ENCOUNTER — Other Ambulatory Visit: Payer: Self-pay

## 2023-07-04 ENCOUNTER — Encounter (HOSPITAL_COMMUNITY)
Admission: RE | Admit: 2023-07-04 | Discharge: 2023-07-04 | Disposition: A | Discharge status: Home / Self Care | Payer: Medicare Other | Source: Ambulatory Visit | Attending: Cardiovascular Disease | Admitting: Cardiovascular Disease

## 2023-07-04 ENCOUNTER — Other Ambulatory Visit: Payer: Self-pay | Admitting: Physician Assistant

## 2023-07-04 ENCOUNTER — Other Ambulatory Visit (HOSPITAL_COMMUNITY): Payer: Self-pay

## 2023-07-04 ENCOUNTER — Ambulatory Visit (HOSPITAL_COMMUNITY)
Admission: RE | Admit: 2023-07-04 | Discharge: 2023-07-04 | Disposition: A | Discharge status: Home / Self Care | Payer: Medicare Other | Source: Ambulatory Visit | Attending: Cardiovascular Disease | Admitting: Cardiovascular Disease

## 2023-07-04 DIAGNOSIS — Z01818 Encounter for other preprocedural examination: Secondary | ICD-10-CM | POA: Insufficient documentation

## 2023-07-04 DIAGNOSIS — I350 Nonrheumatic aortic (valve) stenosis: Secondary | ICD-10-CM | POA: Insufficient documentation

## 2023-07-04 LAB — TYPE AND SCREEN
ABO/RH(D): O NEG
Antibody Screen: NEGATIVE

## 2023-07-04 LAB — CBC
HCT: 38 % (ref 36.0–46.0)
Hemoglobin: 12.7 g/dL (ref 12.0–15.0)
MCH: 32.5 pg (ref 26.0–34.0)
MCHC: 33.4 g/dL (ref 30.0–36.0)
MCV: 97.2 fL (ref 80.0–100.0)
Platelets: 236 10*3/uL (ref 150–400)
RBC: 3.91 MIL/uL (ref 3.87–5.11)
RDW: 12.1 % (ref 11.5–15.5)
WBC: 9.6 10*3/uL (ref 4.0–10.5)
nRBC: 0 % (ref 0.0–0.2)

## 2023-07-04 LAB — COMPREHENSIVE METABOLIC PANEL
ALT: 19 U/L (ref 0–44)
AST: 24 U/L (ref 15–41)
Albumin: 4 g/dL (ref 3.5–5.0)
Alkaline Phosphatase: 65 U/L (ref 38–126)
Anion gap: 11 (ref 5–15)
BUN: 11 mg/dL (ref 8–23)
CO2: 26 mmol/L (ref 22–32)
Calcium: 9.1 mg/dL (ref 8.9–10.3)
Chloride: 97 mmol/L — ABNORMAL LOW (ref 98–111)
Creatinine, Ser: 1.05 mg/dL — ABNORMAL HIGH (ref 0.44–1.00)
GFR, Estimated: 55 mL/min — ABNORMAL LOW (ref >60)
Glucose, Bld: 107 mg/dL — ABNORMAL HIGH (ref 70–99)
Potassium: 2.7 mmol/L — CL (ref 3.5–5.1)
Sodium: 134 mmol/L — ABNORMAL LOW (ref 135–145)
Total Bilirubin: 1 mg/dL (ref <1.2)
Total Protein: 6.7 g/dL (ref 6.5–8.1)

## 2023-07-04 LAB — SURGICAL PCR SCREEN
MRSA, PCR: NEGATIVE
Staphylococcus aureus: NEGATIVE

## 2023-07-04 LAB — SARS CORONAVIRUS 2 BY RT PCR: SARS Coronavirus 2 by RT PCR: NEGATIVE

## 2023-07-04 LAB — PROTIME-INR
INR: 0.9 (ref 0.8–1.2)
Prothrombin Time: 12.3 s (ref 11.4–15.2)

## 2023-07-04 MED ORDER — NOREPINEPHRINE 4 MG/250ML-% IV SOLN
0.0000 ug/min | INTRAVENOUS | Status: AC
  Administered 2023-07-05: 2 ug/min via INTRAVENOUS
  Filled 2023-07-04: qty 250

## 2023-07-04 MED ORDER — CEFAZOLIN SODIUM-DEXTROSE 2-4 GM/100ML-% IV SOLN
2.0000 g | INTRAVENOUS | Status: AC
  Administered 2023-07-05: 2 g via INTRAVENOUS
  Filled 2023-07-04: qty 100

## 2023-07-04 MED ORDER — POTASSIUM CHLORIDE CRYS ER 20 MEQ PO TBCR
20.0000 meq | EXTENDED_RELEASE_TABLET | Freq: Every day | ORAL | 0 refills | Status: AC
  Filled 2023-07-04: qty 90, 90d supply, fill #0

## 2023-07-04 MED ORDER — MAGNESIUM SULFATE 50 % IJ SOLN
40.0000 meq | INTRAMUSCULAR | Status: DC
  Filled 2023-07-04: qty 9.85

## 2023-07-04 MED ORDER — DEXMEDETOMIDINE HCL IN NACL 400 MCG/100ML IV SOLN
0.1000 ug/kg/h | INTRAVENOUS | Status: AC
  Administered 2023-07-05: 1 ug/kg/h via INTRAVENOUS
  Filled 2023-07-04: qty 100

## 2023-07-04 MED ORDER — POTASSIUM CHLORIDE 2 MEQ/ML IV SOLN
80.0000 meq | INTRAVENOUS | Status: DC
  Filled 2023-07-04: qty 40

## 2023-07-04 MED ORDER — HEPARIN 30,000 UNITS/1000 ML (OHS) CELLSAVER SOLUTION
Status: DC
  Filled 2023-07-04: qty 1000

## 2023-07-04 NOTE — H&P (Signed)
[address]       Samantha Davidson [address]             [phone]      Cardiothoracic Surgery Admission History and Physical  PCP is McElwee, Samantha Church, NP Referring Provider is Samantha Skeans, MD Primary Cardiologist is Samantha Constant, MD   Reason for admission:  Severe aortic stenosis   HPI:   The patient is Davidson 77 year old woman with Davidson history of hypertension, hyperlipidemia, asthma, and aortic stenosis who was referred for consideration of TAVR.  She was previously living in [place] and followed by cardiology there for aortic stenosis.  She was living in Davidson 4 story house there and had Davidson fall down steps sustaining Davidson cervical neck fracture that required surgery Davidson few years ago.  She said this occurred when she tripped while walking up stairs.  She subsequently sold that house and moved to [facility] but comes to [place] for her medical care since she had previously lived in [place] for many years before moving to [place].  Her most recent echo on 06/02/2023 showed Davidson calcified and thickened aortic valve with Davidson mean gradient of 39 mmHg and Davidson peak gradient of 69 mmHg.  Aortic valve area by VTI was 1.07 cm.  Left ventricular ejection fraction was 60 to 65% with grade 1 diastolic dysfunction.  She reports progressive exertional shortness of breath and fatigue that seems to be worsening over the past few months.  It is now limiting her daily activities.  She has been having frequent dizziness when changing positions.  She denies any syncope.  She denies any chest discomfort.  She has had no peripheral edema.  She has had some recent shortness of breath when laying in bed and sleeps up on Davidson couple pillows now.  She has Davidson history of asthma but has been relatively stable for the past several years.   She was scheduled for cardiac catheterization on 06/10/2023 but developed an upper respiratory infection that she thought was Davidson bad cold.  The procedure was  rescheduled for 06/16/2023.  Her upper respiratory symptoms subsequently completely resolved and she now feels at her baseline.  She denies any fever or chills.  She has had no cough.  Her CTA of the chest on 06/17/2023 showed no lung consolidation or pleural effusion.  There were Davidson few small lung nodules that will require follow-up.   She lives [facility] at [facility] but has Davidson niece in [place] that she stays with when she comes down here.  She has 2 sons who live out of town.  One is Davidson hospitalist.     Past Medical History:  Diagnosis Date   Allergy     Asthma     Depression     GERD (gastroesophageal reflux disease)     History of DVT of lower extremity     Hypertension     Lymphocytic colitis     Osteoporosis     Rectal polyp     Tubular adenoma of colon                 Past Surgical History:  Procedure Laterality Date   COLONOSCOPY       FOOT SURGERY   08/09/1986    pin in both great toes   HAND SURGERY Left     RIGHT HEART CATH AND CORONARY ANGIOGRAPHY N/Davidson 06/16/2023    Procedure: RIGHT HEART CATH AND CORONARY  ANGIOGRAPHY;  Surgeon: Samantha Hazel, MD;  Location: [facility] INVASIVE CV LAB;  Service: Cardiovascular;  Laterality: N/Davidson;               Family History  Problem Relation Age of Onset   Diabetes Mother     Lung cancer Mother     Heart disease Mother     Mental illness Mother     Alzheimer's disease Mother     Cancer Father     Arthritis Maternal Grandmother            Social History         Socioeconomic History   Marital status: Single      Spouse name: Not on file   Number of children: 2   Years of education: Not on file   Highest education level: Bachelor's degree (e.g., BA, AB, BS)  Occupational History   Occupation: Systems analyst: [employer]      Employer: [employer],  Tobacco Use   Smoking status: Former   Smokeless tobacco: Never   Tobacco comments:      only when stressed  Vaping Use    Vaping status: Never Used  Substance and Sexual Activity   Alcohol use: Not Currently      Comment: wine on occasion   Drug use: No   Sexual activity: Not Currently  Other Topics Concern   Not on file  Social History Narrative   Not on file    Social Determinants of Health        Financial Resource Strain: Low Risk  (02/14/2023)    Overall Financial Resource Strain (CARDIA)     Difficulty of Paying Living Expenses: Not hard at all  Food Insecurity: No Food Insecurity (02/14/2023)    Hunger Vital Sign     Worried About Running Out of Food in the Last Year: Never true     Ran Out of Food in the Last Year: Never true  Transportation Needs: No Transportation Needs (02/14/2023)    PRAPARE - Therapist, art (Medical): No     Lack of Transportation (Non-Medical): No  Physical Activity: Insufficiently Active (02/14/2023)    Exercise Vital Sign     Days of Exercise per Week: 2 days     Minutes of Exercise per Session: 30 min  Stress: No Stress Concern Present (02/14/2023)    [employer] of Occupational Health - Occupational Stress Questionnaire     Feeling of Stress : Not at all  Social Connections: Unknown (02/14/2023)    Social Connection and Isolation Panel [NHANES]     Frequency of Communication with Friends and Family: Not on file     Frequency of Social Gatherings with Friends and Family: Not on file     Attends Religious Services: Never     Active Member of Clubs or Organizations: No     Attends Banker Meetings: Not on file     Marital Status: Widowed  Intimate Partner Violence: Not on file             Prior to Admission medications   Medication Sig Start Date End Date Taking? Authorizing Provider  albuterol (VENTOLIN HFA) 108 (90 Base) MCG/ACT inhaler Inhale 1-2 puffs into the lungs every 6 (six) hours as needed for wheezing or shortness of breath. 02/17/23   Yes McElwee, Samantha A, NP  alendronate (FOSAMAX) 70 MG tablet Take 1 tablet (70  mg total) by mouth once Davidson week. 02/17/23   Yes McElwee, Samantha A, NP  amLODipine (NORVASC) 10 MG tablet Take 10 mg by mouth daily. 05/10/23   Yes [provider]  aspirin 81 MG EC tablet Take 81 mg by mouth daily.       Yes [provider]  atorvastatin (LIPITOR) 40 MG tablet Take 1 tablet (40 mg total) by mouth daily. 05/12/23   Yes Chandrasekhar, Mahesh A, MD  buPROPion (WELLBUTRIN XL) 300 MG 24 hr tablet 1 po qd 01/30/19   Yes Lowne Florina Ou R, DO  chlorthalidone (HYGROTON) 25 MG tablet Take 1 tablet (25 mg total) by mouth daily. 02/17/23   Yes McElwee, Samantha A, NP  cholecalciferol (VITAMIN D3) 25 MCG (1000 UNIT) tablet Take 1,000 Units by mouth daily.     Yes [provider]  citalopram (CELEXA) 20 MG tablet Take 1 tablet (20 mg total) by mouth daily. 02/17/23   Yes McElwee, Samantha A, NP  EPINEPHrine (EPIPEN) 0.3 mg/0.3 mL DEVI Inject 0.3 mLs (0.3 mg total) into the muscle once. 05/17/11   Yes Seabron Spates R, DO  ezetimibe (ZETIA) 10 MG tablet Take 1 tablet (10 mg total) by mouth daily. 05/12/23   Yes Chandrasekhar, Mahesh A, MD  famotidine (PEPCID) 40 MG tablet Take 1 tablet (40 mg total) by mouth at bedtime. Patient taking differently: Take 40 mg by mouth daily as needed (Asthma). 02/17/23   Yes McElwee, Samantha A, NP  fluticasone-salmeterol (ADVAIR) 100-50 MCG/ACT AEPB Inhale 1 puff into the lungs 2 (two) times daily. Patient taking differently: Inhale 1 puff into the lungs 2 (two) times daily as needed (Asthma). 02/17/23   Yes McElwee, Samantha A, NP  fluticasone-salmeterol (WIXELA INHUB) 100-50 MCG/ACT AEPB Inhale 1 puff into the lungs 2 (two) times daily.     Yes [provider]  ibuprofen (ADVIL,MOTRIN) 800 MG tablet TAKE 1 TABLET BY MOUTH EVERY 6-8 HOURS AS NEEDED FOR PAIN 11/23/17   Yes [provider]  loratadine (CLARITIN) 10 MG tablet Take 10 mg by mouth daily.     Yes [provider]  Multiple Vitamin (MULTIVITAMIN PO) Take 1  tablet by mouth daily.       Yes [provider]  valsartan (DIOVAN) 40 MG tablet Take 1 tablet (40 mg total) by mouth daily. 02/17/23   Yes McElwee, Samantha A, NP  metoprolol tartrate (LOPRESSOR) 50 MG tablet Take 1 tablet (50 mg total) by mouth prior to 11/08 CT scan. 06/15/23 06/17/23   Janetta Hora, PA-C            Current Outpatient Medications  Medication Sig Dispense Refill   albuterol (VENTOLIN HFA) 108 (90 Base) MCG/ACT inhaler Inhale 1-2 puffs into the lungs every 6 (six) hours as needed for wheezing or shortness of breath. 8 g 6   alendronate (FOSAMAX) 70 MG tablet Take 1 tablet (70 mg total) by mouth once Davidson week. 12 tablet 3   amLODipine (NORVASC) 10 MG tablet Take 10 mg by mouth daily.       aspirin 81 MG EC tablet Take 81 mg by mouth daily.         atorvastatin (LIPITOR) 40 MG tablet Take 1 tablet (40 mg total) by mouth daily. 90 tablet 3   buPROPion (WELLBUTRIN XL) 300 MG 24 hr tablet 1 po qd 90 tablet 1   chlorthalidone (HYGROTON) 25 MG tablet Take 1 tablet (25 mg total) by mouth daily. 90 tablet 3  cholecalciferol (VITAMIN D3) 25 MCG (1000 UNIT) tablet Take 1,000 Units by mouth daily.       citalopram (CELEXA) 20 MG tablet Take 1 tablet (20 mg total) by mouth daily. 90 tablet 3   EPINEPHrine (EPIPEN) 0.3 mg/0.3 mL DEVI Inject 0.3 mLs (0.3 mg total) into the muscle once. 1 Device 1   ezetimibe (ZETIA) 10 MG tablet Take 1 tablet (10 mg total) by mouth daily. 90 tablet 3   famotidine (PEPCID) 40 MG tablet Take 1 tablet (40 mg total) by mouth at bedtime. (Patient taking differently: Take 40 mg by mouth daily as needed (Asthma).) 90 tablet 3   fluticasone-salmeterol (ADVAIR) 100-50 MCG/ACT AEPB Inhale 1 puff into the lungs 2 (two) times daily. (Patient taking differently: Inhale 1 puff into the lungs 2 (two) times daily as needed (Asthma).) 60 each 6   fluticasone-salmeterol (WIXELA INHUB) 100-50 MCG/ACT AEPB Inhale 1 puff into the lungs 2 (two) times daily.        ibuprofen (ADVIL,MOTRIN) 800 MG tablet TAKE 1 TABLET BY MOUTH EVERY 6-8 HOURS AS NEEDED FOR PAIN   2   loratadine (CLARITIN) 10 MG tablet Take 10 mg by mouth daily.       Multiple Vitamin (MULTIVITAMIN PO) Take 1 tablet by mouth daily.         valsartan (DIOVAN) 40 MG tablet Take 1 tablet (40 mg total) by mouth daily. 90 tablet 3   metoprolol tartrate (LOPRESSOR) 50 MG tablet Take 1 tablet (50 mg total) by mouth prior to 11/08 CT scan. 1 tablet 0      No current facility-administered medications for this visit.        Allergies      Allergies  Allergen Reactions   Codeine Nausea And Vomiting   Doxycycline Diarrhea            Review of Systems:               General:                      normal appetite, + decreased energy, no weight gain, no weight loss, no fever             Cardiac:                       no chest pain with exertion, no chest pain at rest, + SOB with mild exertion, no resting SOB, no PND, + orthopnea, no palpitations, no arrhythmia, no atrial fibrillation, no LE edema, + dizzy spells, no syncope             Respiratory:                 + exertional shortness of breath, no home oxygen, no productive cough, no dry cough, no bronchitis, no wheezing, no hemoptysis, + asthma, no pain with inspiration or cough, no sleep apnea, no CPAP at night             GI:                               no difficulty swallowing, no reflux, no frequent heartburn, no hiatal hernia, n abdominal pain, ono constipation, no diarrhea, no hematochezia, no hematemesis, no melena             GU:  no dysuria,  no frequency, no urinary tract infection, no hematuria, no kidney stones, nokidney disease             Vascular:                     no pain suggestive of claudication, no pain in feet, no leg cramps, no varicose veins, no DVT, no non-healing foot ulcer             Neuro:                         no stroke, no TIA's, no seizures, no headaches, no temporary blindness  one eye,  no slurred speech, no peripheral neuropathy, no chronic pain, no instability of gait, no memory/cognitive dysfunction             Musculoskeletal:         no arthritis, no joint swelling, no myalgias, no difficulty walking, normal mobility              Skin:                            no rash, no itching, no skin infections, no pressure sores or ulcerations             Psych:                         no anxiety, no depression, no nervousness, no unusual recent stress             Eyes:                           no blurry vision, no floaters, no recent vision changes, no wears glasses or contacts             ENT:                            no hearing loss, no loose or painful teeth, no dentures, last saw dentist this year             Hematologic:               no easy bruising, no abnormal bleeding, n clotting disorder, ono frequent epistaxis             Endocrine:                   no diabetes, does not check CBG's at home                            Physical Exam:               BP 134/79 (BP Location: Left Arm, Patient Position: Sitting)   Pulse 77   Resp 18   Ht 5\' 3"  (1.6 m)   Wt 178 lb (80.7 kg)   SpO2 98% Comment: RA  BMI 31.53 kg/m              General:                      well-appearing             HEENT:  Unremarkable, NCAT, PERLA, EOMI             Neck:                           no JVD, no bruits, no adenopathy              Chest:                          clear to auscultation, symmetrical breath sounds, no wheezes, no rhonchi             CV:                              RRR, 3/6 systolic murmur RSB, no diastolic murmur             Abdomen:                    soft, non-tender, no masses              Extremities:                 warm, well-perfused, pedal pulses palpable, no lower extremity edema             Rectal/GU                   Deferred             Neuro:                         Grossly non-focal and symmetrical throughout             Skin:                             Clean and dry, no rashes, no breakdown   Diagnostic Tests:   ECHOCARDIOGRAM REPORT       Patient Name:   Samantha Davidson Date of Exam: 06/02/2023  Medical Rec #:  [id number]               Height:       63.5 in  Accession #:    [id number]              Weight:       183.0 lb  Date of Birth:  1945-09-21               BSA:          1.873 m  Patient Age:    77 years                BP:           116/68 mmHg  Patient Gender: F                       HR:           88 bpm.  Exam Location:  [address]   Procedure: 2D Echo, Cardiac Doppler, Color Doppler, 3D Echo and Strain  Analysis   Indications:   I35.0 Nonrheumatic aortic (valve) stenosis; R01.1 Murmur    History:        Patient has no prior history of Echocardiogram  examinations.  Risk Factors:Hypertension and Dyslipidemia. Bilateral  carotid                 artery stenosis.    Sonographer:    Cathie Beams RCS  Referring Phys: [id number] [facility] Davidson CHANDRASEKHAR   IMPRESSIONS     1. Left ventricular ejection fraction, by estimation, is 60 to 65%. The  left ventricle has normal function. The left ventricle has no regional  wall motion abnormalities. Left ventricular diastolic parameters are  consistent with Grade I diastolic  dysfunction (impaired relaxation). The average left ventricular global  longitudinal strain is -22.4 %. The global longitudinal strain is normal.   2. Right ventricular systolic function is normal. The right ventricular  size is normal. There is normal pulmonary artery systolic pressure. The  estimated right ventricular systolic pressure is 25.1 mmHg.   3. The mitral valve is degenerative. No evidence of mitral valve  regurgitation. No evidence of mitral stenosis. Moderate mitral annular  calcification.   4. The aortic valve is calcified. Aortic valve regurgitation is not  visualized. Severe aortic valve stenosis. Aortic valve area, by VTI  measures 1.07 cm.  Aortic valve mean gradient measures 39.0 mmHg. Aortic  valve Vmax measures 4.16 m/s. DVI 0.34.   5. The inferior vena cava is normal in size with greater than 50%  respiratory variability, suggesting right atrial pressure of 3 mmHg.   FINDINGS   Left Ventricle: Left ventricular ejection fraction, by estimation, is 60  to 65%. The left ventricle has normal function. The left ventricle has no  regional wall motion abnormalities. The average left ventricular global  longitudinal strain is -22.4 %.  The global longitudinal strain is normal. The left ventricular internal  cavity size was normal in size. There is no left ventricular hypertrophy.  Left ventricular diastolic parameters are consistent with Grade I  diastolic dysfunction (impaired  relaxation). Normal left ventricular filling pressure.   Right Ventricle: The right ventricular size is normal. No increase in  right ventricular wall thickness. Right ventricular systolic function is  normal. There is normal pulmonary artery systolic pressure. The tricuspid  regurgitant velocity is 2.35 m/s, and   with an assumed right atrial pressure of 3 mmHg, the estimated right  ventricular systolic pressure is 25.1 mmHg.   Left Atrium: Left atrial size was normal in size.   Right Atrium: Right atrial size was normal in size.   Pericardium: There is no evidence of pericardial effusion.   Mitral Valve: The mitral valve is degenerative in appearance. There is  mild thickening of the mitral valve leaflet(s). There is mild  calcification of the mitral valve leaflet(s). Moderate mitral annular  calcification. No evidence of mitral valve  regurgitation. No evidence of mitral valve stenosis.   Tricuspid Valve: The tricuspid valve is normal in structure. Tricuspid  valve regurgitation is mild . No evidence of tricuspid stenosis.   Aortic Valve: The aortic valve is calcified. Aortic valve regurgitation is  not visualized. Severe aortic stenosis is  present. Aortic valve mean  gradient measures 39.0 mmHg. Aortic valve peak gradient measures 69.2  mmHg. Aortic valve area, by VTI measures   1.07 cm.   Pulmonic Valve: The pulmonic valve was normal in structure. Pulmonic valve  regurgitation is mild. No evidence of pulmonic stenosis.   Aorta: The aortic root is normal in size and structure.   Venous: The inferior vena cava is normal in size with greater than 50%  respiratory variability, suggesting right atrial pressure of 3 mmHg.  IAS/Shunts: No atrial level shunt detected by color flow Doppler.     LEFT VENTRICLE  PLAX 2D  LVIDd:         3.80 cm   Diastology  LVIDs:         2.10 cm   LV e' medial:    7.51 cm/s  LV PW:         0.70 cm   LV E/e' medial:  12.5  LV IVS:        1.00 cm   LV e' lateral:   8.27 cm/s  LVOT diam:     2.00 cm   LV E/e' lateral: 11.3  LV SV:         95  LV SV Index:   51        2D Longitudinal Strain  LVOT Area:     3.14 cm  2D Strain GLS (A2C):   -21.5 %                           2D Strain GLS (A3C):   -26.0 %                           2D Strain GLS (A4C):   -19.7 %                           2D Strain GLS Avg:     -22.4 %   RIGHT VENTRICLE  RV Basal diam:  2.90 cm  RV S prime:     14.50 cm/s  TAPSE (M-mode): 2.5 cm  RVSP:           25.1 mmHg   LEFT ATRIUM             Index        RIGHT ATRIUM           Index  LA diam:        3.40 cm 1.82 cm/m   RA Pressure: 3.00 mmHg  LA Vol (A2C):   58.5 ml 31.23 ml/m  RA Area:     12.00 cm  LA Vol (A4C):   55.8 ml 29.79 ml/m  RA Volume:   27.70 ml  14.79 ml/m  LA Biplane Vol: 58.3 ml 31.13 ml/m   AORTIC VALVE  AV Area (Vmax):    1.03 cm  AV Area (Vmean):   1.03 cm  AV Area (VTI):     1.07 cm  AV Vmax:           416.00 cm/s  AV Vmean:          292.500 cm/s  AV VTI:            0.887 m  AV Peak Grad:      69.2 mmHg  AV Mean Grad:      39.0 mmHg  LVOT Vmax:         136.00 cm/s  LVOT Vmean:        95.500 cm/s  LVOT VTI:          0.303 m  LVOT/AV  VTI ratio: 0.34    AORTA  Ao Root diam: 3.20 cm  Ao Asc diam:  3.00 cm   MITRAL VALVE                TRICUSPID VALVE  MV Area (PHT): 2.23 cm     TR Peak  grad:   22.1 mmHg  MV Decel Time: 340 msec     TR Vmax:        235.00 cm/s  MV E velocity: 93.80 cm/s   Estimated RAP:  3.00 mmHg  MV Davidson velocity: 138.00 cm/s  RVSP:           25.1 mmHg  MV E/Davidson ratio:  0.68                              SHUNTS                              Systemic VTI:  0.30 m                              Systemic Diam: 2.00 cm   Armanda Magic MD  Electronically signed by Armanda Magic MD  Signature Date/Time: 06/02/2023/3:31:10 PM        Final      Physicians   Panel Physicians Referring Physician Case Authorizing Physician  Samantha Hazel, MD (Primary)        Procedures   RIGHT HEART CATH AND CORONARY ANGIOGRAPHY    Conclusion       Prox RCA lesion is 20% stenosed.   Mid Cx lesion is 30% stenosed.   Mid LAD lesion is 40% stenosed.   Mild non-obstructive coronary artery disease Normal right heart pressures    Recommendations: Continue workup for TAVR. Medical management of mild CAD   Indications   Severe aortic stenosis [I35.0 (ICD-10-CM)]    Procedural Details   Technical Details Indication: 77 yo female with severe aortic stenosis, workup for TAVR  Procedure: The risks, benefits, complications, treatment options, and expected outcomes were discussed with the patient. The patient and/or family concurred with the proposed plan, giving informed consent. The patient was sedated with Versed and Fentanyl. The IV catheter in the right antecubital vein was changed for Davidson 5 [place] sheath. Right heart catheterization performed with Davidson balloon tipped catheter. The right wrist was prepped and draped in Davidson sterile fashion. 1% lidocaine was used for local anesthesia. Using the modified Seldinger access technique, Davidson 5 French sheath was placed in the right radial artery. 3 mg Verapamil was given through the  sheath. Weight based IV heparin was given. Standard diagnostic catheters were used to perform selective coronary angiography. I did not cross the aortic valve. All catheter exchanges were performed over an exchange length guidewire.   The sheath was removed from the right radial artery and Davidson hemostasis band was applied at the arteriotomy site on the right wrist.      Estimated blood loss <50 mL.   During this procedure medications were administered to achieve and maintain moderate conscious sedation while the patient's heart rate, blood pressure, and oxygen saturation were continuously monitored and I was present face-to-face 100% of this time.    Medications (Filter: Administrations occurring from 1213 to 1253 on 06/16/23) fentaNYL (SUBLIMAZE) injection (mcg)  Total dose: 25 mcg Date/Time Rate/Dose/Volume Action    06/16/23 1228 25 mcg Given    midazolam (VERSED) injection (mg)  Total dose: 1 mg Date/Time Rate/Dose/Volume Action    06/16/23 1228 1 mg Given    lidocaine (PF) (XYLOCAINE) 1 % injection (mL)  Total volume: 10 mL Date/Time Rate/Dose/Volume Action    06/16/23 1233 10 mL  Given    Heparin (Porcine) in NaCl 1000-0.9 UT/500ML-% SOLN (mL)  Total volume: 1,000 mL Date/Time Rate/Dose/Volume Action    06/16/23 1234 500 mL Given    1234 500 mL Given    Radial Cocktail/Verapamil only (mL)  Total volume: 10 mL Date/Time Rate/Dose/Volume Action    06/16/23 1238 10 mL Given    heparin sodium (porcine) injection (Units)  Total dose: 4,000 Units Date/Time Rate/Dose/Volume Action    06/16/23 1242 4,000 Units Given    iohexol (OMNIPAQUE) 350 MG/ML injection (mL)  Total volume: 35 mL Date/Time Rate/Dose/Volume Action    06/16/23 1247 35 mL Given      Sedation Time   Sedation Time Physician-1: 20 minutes 5 seconds Contrast        Administrations occurring from 1213 to 1253 on 06/16/23:  Medication Name Total Dose  iohexol (OMNIPAQUE) 350 MG/ML injection 35 mL     Radiation/Fluoro   Fluoro time: 4.4 (min) DAP: 13425 (mGycm2) Cumulative Air Kerma: 250 (mGy) Complications   Complications documented before study signed (06/16/2023 12:55 PM)    No complications were associated with this study.  Documented by Earlean Polka, RT - 06/16/2023 12:52 PM      Coronary Findings   Diagnostic Dominance: Right Left Anterior Descending  Vessel is large.  Mid LAD lesion is 40% stenosed.    Left Circumflex  Mid Cx lesion is 30% stenosed.    Right Coronary Artery  Prox RCA lesion is 20% stenosed.    Intervention    No interventions have been documented.    Coronary Diagrams   Diagnostic Dominance: Right  Intervention    Implants    No implant documentation for this case.    Syngo Images    Show images for CARDIAC CATHETERIZATION Images on Long Term Storage    Show images for Mareesa, Nara to Procedure Log   Procedure Log    Link to Procedure Log   Procedure Log    Hemo Data   Flowsheet Row Most Recent Value  Fick Cardiac Output 4.66 L/min  Fick Cardiac Output Index 2.56 (L/min)/BSA  RA Davidson Wave 10 mmHg  RA V Wave 7 mmHg  RA Mean 6 mmHg  RV Systolic Pressure 29 mmHg  RV Diastolic Pressure 0 mmHg  RV EDP 11 mmHg  PA Systolic Pressure 28 mmHg  PA Diastolic Pressure 7 mmHg  PA Mean 16 mmHg  PW Davidson Wave 13 mmHg  PW V Wave 11 mmHg  PW Mean 8 mmHg  AO Systolic Pressure 129 mmHg  AO Diastolic Pressure 65 mmHg  AO Mean 89 mmHg  QP/QS 1  TPVR Index 6.26 HRUI  TSVR Index 34.81 HRUI  PVR SVR Ratio 0.1  TPVR/TSVR Ratio 0.18    Narrative & Impression  CLINICAL DATA:  Aortic valve replacement (TAVR), pre-op eval   EXAM: Cardiac TAVR CT   TECHNIQUE: The patient was scanned on Davidson Siemens Force 192 slice scanner. Davidson 120 kV retrospective scan was triggered in the descending thoracic aorta at 111 HU's. Gantry rotation speed was 270 msecs and collimation was .9 mm. The 3D data set was reconstructed in 5% intervals  of the R-R cycle. Systolic and diastolic phases were analyzed on Davidson dedicated work station using MPR, MIP and VRT modes. The patient received OMNIPAQUE IOHEXOL 350 MG/ML SOLN of contrast.   FINDINGS: Aortic Valve:   Tricuspid aortic valve with severely reduced cusp excursion. Severely thickened and severely calcified aortic valve cusps.   AV calcium score:  1711   Virtual Basal Annulus Measurements:   Maximum/Minimum Diameter: 25.3 x 21 mm   Perimeter: 73.4 mm   Area:  409 mm2   Focal LVOT calcifications under LCC.   Membranous septal length: 6 mm   Based on these measurements, the annulus would be suitable for Davidson 23 mm Sapien 3 valve. Alternatively, Heart Team can consider 29 mm Evolut valve. Recommend Heart Team discussion for valve selection.   Sinus of Valsalva Measurements:   Non-coronary:  30 mm   Right - coronary:  29 mm   Left - coronary:  31 mm   Sinus of Valsalva Height:   Left: 18.7 mm   Right: 21.4 mm   Aorta: Conventional 3 vessel branch pattern of aortic arch.   Sinotubular Junction:  26 mm   Ascending Thoracic Aorta:  30 mm   Aortic Arch: 27 mm   Descending Thoracic Aorta:  23 mm   Coronary Artery Height above Annulus:   Left main: 14.3 mm   Right coronary: 20.2 mm   Coronary Arteries: Normal coronary origin. Right dominance. The study was performed without use of NTG and insufficient for plaque evaluation.   Optimum Fluoroscopic Angle for Delivery: RAO 0 CAU 14   OTHER:   Atria: Mild LA dilation   Left atrial appendage: No thrombus.   Mitral valve: Grossly normal   Pulmonary artery: Normal caliber.   Pulmonary veins: Normal anatomy.   IMPRESSION: 1. 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 Severely thickened and severely calcified aortic valve cusps. 2. Aortic valve calcium score: 1711 3. Annulus area: 409 mm2, suitable for 23 mm Sapien 3 valve. Mild LVOT calcifications. Membranous septal length  6mm. 4. Sufficient coronary artery heights from annulus. 5. Optimum fluoroscopic angle for delivery: RAO 0 CAU 14   Electronically Signed: By: Weston Brass M.D. On: 06/17/2023 14:54        Narrative & Impression  CLINICAL DATA:  Aortic valve replacement preop evaluation   EXAM: CT ANGIOGRAPHY CHEST, ABDOMEN AND PELVIS   TECHNIQUE: Non-contrast CT of the chest was initially obtained.   Multidetector CT imaging through the chest, abdomen and pelvis was performed using the standard protocol during bolus administration of intravenous contrast. Multiplanar reconstructed images and MIPs were obtained and reviewed to evaluate the vascular anatomy.   RADIATION DOSE REDUCTION: This exam was performed according to the departmental dose-optimization program which includes automated exposure control, adjustment of the mA and/or kV according to patient size and/or use of iterative reconstruction technique.   CONTRAST:  OMNIPAQUE IOHEXOL 350 MG/ML SOLN   COMPARISON:  Chest CT dated April 9th 2019   FINDINGS: CTA CHEST FINDINGS   Cardiovascular: Normal heart size. No No pericardial effusion. Aortic valve thickening and calcifications. Three-vessel coronary artery calcifications. Mitral annular calcifications.   Mediastinum/Nodes: Small hiatal hernia. Thyroid is unremarkable. No No enlarged lymph nodes seen in the chest.   Lungs/Pleura: Central airways are patent. No consolidation, pleural effusion or pneumothorax. New part solid nodule of the right upper lobe measuring 6 mm, predominantly solid with mild amount of associated ground-glass, located on series 7, image 52. Additional part solid nodule of the lingula measuring 7 mm with 2 mm solid component on series 7, image 69, likely unchanged when compared with 2019 prior, although motion artifact on prior exam limits evaluation.   Musculoskeletal: No chest wall abnormality. No acute or significant osseous findings.    CTA ABDOMEN AND PELVIS FINDINGS   Hepatobiliary: No focal liver abnormality is  seen. No gallstones. No biliary ductal dilation. Mild focal thickening of the gallbladder fundus which is likely due to adenomyomatosis.   Pancreas: Unremarkable. No pancreatic ductal dilatation or surrounding inflammatory changes.   Spleen: Normal in size without focal abnormality.   Adrenals/Urinary Tract: Bilateral adrenal glands are unremarkable. Hydronephrosis or nephrolithiasis. Bilateral low-attenuation renal lesions which are too small to accurately characterize but likely simple cysts, specific follow-up imaging is necessary. Bladder is unremarkable.   Stomach/Bowel: Stomach is within normal limits. Normal appendix. No evidence of bowel wall thickening, distention, or inflammatory changes.   Vascular/lymphatic: Normal caliber thoracic aorta with mild atherosclerotic disease. No enlarged lymph nodes seen in the abdomen or pelvis.   Reproductive: Uterus and bilateral adnexa are unremarkable.   Other: No abdominal wall hernia or abnormality. No abdominopelvic ascites.   Musculoskeletal: No acute or significant osseous findings.   VASCULAR MEASUREMENTS PERTINENT TO TAVR:   AORTA:   Minimal Aortic Diameter-12.2 mm   Severity of Aortic Calcification-mild   RIGHT PELVIS:   Right Common Iliac Artery -   Minimal Diameter-8.7 mm   Tortuosity-moderate   Calcification-mild   Right External Iliac Artery -   Minimal Diameter-6.8 mm   Tortuosity-moderate   Calcification-none   Right Common Femoral Artery -   Minimal Diameter-7.2 mm   Tortuosity-none   Calcification-none   LEFT PELVIS:   Left Common Iliac Artery -   Minimal Diameter-8.6 mm   Tortuosity-mild   Calcification-none   Left External Iliac Artery -   Minimal Diameter-6.6 mm   Tortuosity-moderate   Calcification-none   Left Common Femoral Artery -   Minimal Diameter-6.9 mm   Tortuosity-mild    Calcification-none   Review of the MIP images confirms the above findings.   IMPRESSION: Vascular:   1. Vascular findings and measurements pertinent to potential TAVR procedure, as detailed above. 2. Thickening and calcification of the aortic valve, compatible with reported clinical history of aortic stenosis. 3. Mild aortoiliac atherosclerosis. Three vessel coronary artery disease.   Nonvascular:   1. New right upper lobe part solid nodule. Follow-up non-contrast CT recommended at 3-6 months to confirm persistence. If unchanged, and solid component remains <6 mm, annual CT is recommended until 5 years of stability has been established. If persistent these nodules should be considered highly suspicious if the solid component of the nodule is 6 mm or greater in size and enlarging. This recommendation follows the consensus statement: Guidelines for Management of Incidental Pulmonary Nodules Detected on CT Images: From the Fleischner Society 2017; Radiology 2017; 284:228-243. 2. Additional small part solid nodule of the left upper lobe, likely unchanged when compared with 2019 prior. Recommend attention on follow-up.     Electronically Signed   By: Allegra Lai M.D.   On: 06/17/2023 13:05      Impression:   This 77 year old woman has stage D, severe, symptomatic aortic stenosis with NYHA class II symptoms of exertional fatigue and shortness of breath consistent with chronic diastolic congestive heart failure as well as frequent episodes of dizziness with changing positions.  I have personally reviewed her 2D echocardiogram, cardiac catheterization, and CTA studies.  Her echocardiogram shows Davidson severely calcified and thickened aortic valve with restricted leaflet mobility with Davidson mean gradient of 39 mmHg and Davidson valve area 1.07 cm consistent with severe aortic stenosis.  Cardiac catheterization showed mild nonobstructive coronary disease with normal right heart pressures.  I agree  that aortic valve replacement is indicated for relief of her progressive symptoms and to prevent left  ventricular dysfunction.  Given her age I think transcatheter aortic valve replacement would be the best option for treating her.  Her gated cardiac CTA shows anatomy suitable for TAVR using Davidson 23 mm Edwards SAPIEN 3 valve.  Her abdominal and pelvic CTA shows adequate pelvic vascular anatomy to allow transfemoral insertion.   The patient was counseled at length regarding treatment alternatives for management of severe symptomatic aortic stenosis. The risks and benefits of surgical intervention has been discussed in detail. Long-term prognosis with medical therapy was discussed. Alternative approaches such as conventional surgical aortic valve replacement, transcatheter aortic valve replacement, and palliative medical therapy were compared and contrasted at length. This discussion was placed in the context of the patient's own specific clinical presentation and past medical history. All of her questions have been addressed.    Following the decision to proceed with transcatheter aortic valve replacement, Davidson discussion was held regarding what types of management strategies would be attempted intraoperatively in the event of life-threatening complications, including whether or not the patient would be considered Davidson candidate for the use of cardiopulmonary bypass and/or conversion to open sternotomy for attempted surgical intervention.  She is in overall good condition for her age and I think she would be Davidson candidate for emergent sternotomy to manage any intraoperative complications.  The patient is aware of the fact that transient use of cardiopulmonary bypass may be necessary. The patient has been advised of Davidson variety of complications that might develop including but not limited to risks of death, stroke, paravalvular leak, aortic dissection or other major vascular complications, aortic annulus rupture, device  embolization, cardiac rupture or perforation, mitral regurgitation, acute myocardial infarction, arrhythmia, heart block or bradycardia requiring permanent pacemaker placement, congestive heart failure, respiratory failure, renal failure, pneumonia, infection, other late complications related to structural valve deterioration or migration, or other complications that might ultimately cause Davidson temporary or permanent loss of functional independence or other long term morbidity. The patient provides full informed consent for the procedure as described and all questions were answered.    She had Davidson recent upper respiratory infection at the end of October that has completely recovered from that.     Plan:   Transfemoral TAVR using Davidson SAPIEN 3 valve.    Alleen Borne, MD

## 2023-07-04 NOTE — Progress Notes (Signed)
Patient signed all consents at PAT lab appointment. CHG soap and instructions were given to patient. CHG surgical prep reviewed with patient and all questions answered.  Patients chart send to anesthesia for review. Pt endorses bronchitis one month ago treated with antibiotics and steroids. She completed treatment and only symptom she is currently having is a cough. TAVR team is aware of these findings and pt is cleared for surgery per Cline Crock, RN.  Pt unable to given sample for UA. Will need to be collected DOS. Order modified.

## 2023-07-04 NOTE — Progress Notes (Signed)
Cline Crock, RN with TAVR team made aware of critical potassium level of 2.7

## 2023-07-04 NOTE — Anesthesia Preprocedure Evaluation (Signed)
Anesthesia Evaluation  Patient identified by MRN, date of birth, ID band Patient awake    Reviewed: Allergy & Precautions, NPO status , Patient's Chart, lab work & pertinent test results  Airway Mallampati: II  TM Distance: >3 FB Neck ROM: Full    Dental no notable dental hx. (+) Dental Advisory Given, Teeth Intact   Pulmonary asthma , Patient abstained from smoking., former smoker   Pulmonary exam normal breath sounds clear to auscultation       Cardiovascular hypertension, Pt. on medications + Valvular Problems/Murmurs AS  Rhythm:Regular Rate:Normal + Systolic murmurs Echo 05/2023  1. Left ventricular ejection fraction, by estimation, is 60 to 65%. The left ventricle has normal function. The left ventricle has no regional wall motion abnormalities. Left ventricular diastolic parameters are consistent with Grade I diastolic dysfunction (impaired relaxation). The average left ventricular global longitudinal strain is -22.4%. The global longitudinal strain is normal.   2. Right ventricular systolic function is normal. The right ventricular size is normal. There is normal pulmonary artery systolic pressure. The estimated right ventricular systolic pressure is 25.1 mmHg.   3. The mitral valve is degenerative. No evidence of mitral valve regurgitation. No evidence of mitral stenosis. Moderate mitral annular calcification.   4. The aortic valve is calcified. Aortic valve regurgitation is not visualized. Severe aortic valve stenosis. Aortic valve area, by VTI measures 1.07 cm. Aortic valve mean gradient measures 39.0 mmHg. Aortic valve Vmax measures 4.16 m/s. DVI 0.34.   5. The inferior vena cava is normal in size with greater than 50% respiratory variability, suggesting right atrial pressure of 3 mmHg.     Neuro/Psych  PSYCHIATRIC DISORDERS Anxiety Depression    negative neurological ROS     GI/Hepatic Neg liver ROS,GERD  ,,  Endo/Other   negative endocrine ROS    Renal/GU negative Renal ROS     Musculoskeletal negative musculoskeletal ROS (+)    Abdominal  (+) + obese  Peds  Hematology negative hematology ROS (+)   Anesthesia Other Findings   Reproductive/Obstetrics                             Anesthesia Physical Anesthesia Plan  ASA: 4  Anesthesia Plan: MAC   Post-op Pain Management: Tylenol PO (pre-op)* and Minimal or no pain anticipated   Induction: Intravenous  PONV Risk Score and Plan: 2 and Ondansetron, Treatment may vary due to age or medical condition, TIVA and Dexamethasone  Airway Management Planned: Natural Airway  Additional Equipment: Arterial line  Intra-op Plan:   Post-operative Plan:   Informed Consent: I have reviewed the patients History and Physical, chart, labs and discussed the procedure including the risks, benefits and alternatives for the proposed anesthesia with the patient or authorized representative who has indicated his/her understanding and acceptance.     Dental advisory given  Plan Discussed with: CRNA  Anesthesia Plan Comments: (Risks of anesthesia explained at length. This includes, but is not limited to, sore throat, damage to teeth, lips gums, tongue and vocal cords, nausea and vomiting, reactions to medications, stroke, heart attack, and death. All patient questions were answered and the patient wishes to proceed. )       Anesthesia Quick Evaluation

## 2023-07-05 ENCOUNTER — Other Ambulatory Visit: Payer: Self-pay

## 2023-07-05 ENCOUNTER — Inpatient Hospital Stay (HOSPITAL_COMMUNITY): Payer: Self-pay | Admitting: Certified Registered Nurse Anesthetist

## 2023-07-05 ENCOUNTER — Other Ambulatory Visit: Payer: Self-pay | Admitting: Physician Assistant

## 2023-07-05 ENCOUNTER — Encounter (HOSPITAL_COMMUNITY)
Admission: RE | Disposition: A | Discharge status: Home / Self Care | Payer: Self-pay | Source: Home / Self Care | Attending: Surgery

## 2023-07-05 ENCOUNTER — Inpatient Hospital Stay (HOSPITAL_COMMUNITY): Payer: Medicare Other | Admitting: Certified Registered Nurse Anesthetist

## 2023-07-05 ENCOUNTER — Encounter (HOSPITAL_COMMUNITY): Payer: Self-pay | Admitting: Cardiovascular Disease

## 2023-07-05 ENCOUNTER — Inpatient Hospital Stay (HOSPITAL_COMMUNITY)
Admission: RE | Admit: 2023-07-05 | Discharge: 2023-07-06 | DRG: 267 | Disposition: A | Discharge status: Home / Self Care | Payer: Medicare Other | Attending: Surgery | Admitting: Surgery

## 2023-07-05 ENCOUNTER — Inpatient Hospital Stay (HOSPITAL_COMMUNITY): Payer: Medicare Other

## 2023-07-05 DIAGNOSIS — Z881 Allergy status to other antibiotic agents status: Secondary | ICD-10-CM

## 2023-07-05 DIAGNOSIS — Z86718 Personal history of other venous thrombosis and embolism: Secondary | ICD-10-CM

## 2023-07-05 DIAGNOSIS — Z8261 Family history of arthritis: Secondary | ICD-10-CM

## 2023-07-05 DIAGNOSIS — J45909 Unspecified asthma, uncomplicated: Secondary | ICD-10-CM | POA: Diagnosis present

## 2023-07-05 DIAGNOSIS — I2510 Atherosclerotic heart disease of native coronary artery without angina pectoris: Secondary | ICD-10-CM | POA: Diagnosis present

## 2023-07-05 DIAGNOSIS — I350 Nonrheumatic aortic (valve) stenosis: Secondary | ICD-10-CM

## 2023-07-05 DIAGNOSIS — Z79899 Other long term (current) drug therapy: Secondary | ICD-10-CM | POA: Diagnosis not present

## 2023-07-05 DIAGNOSIS — Z818 Family history of other mental and behavioral disorders: Secondary | ICD-10-CM

## 2023-07-05 DIAGNOSIS — Z952 Presence of prosthetic heart valve: Secondary | ICD-10-CM

## 2023-07-05 DIAGNOSIS — K219 Gastro-esophageal reflux disease without esophagitis: Secondary | ICD-10-CM | POA: Diagnosis present

## 2023-07-05 DIAGNOSIS — Z954 Presence of other heart-valve replacement: Secondary | ICD-10-CM | POA: Diagnosis not present

## 2023-07-05 DIAGNOSIS — E876 Hypokalemia: Secondary | ICD-10-CM | POA: Diagnosis present

## 2023-07-05 DIAGNOSIS — Z885 Allergy status to narcotic agent status: Secondary | ICD-10-CM | POA: Diagnosis not present

## 2023-07-05 DIAGNOSIS — Z7982 Long term (current) use of aspirin: Secondary | ICD-10-CM

## 2023-07-05 DIAGNOSIS — Z006 Encounter for examination for normal comparison and control in clinical research program: Secondary | ICD-10-CM

## 2023-07-05 DIAGNOSIS — I1310 Hypertensive heart and chronic kidney disease without heart failure, with stage 1 through stage 4 chronic kidney disease, or unspecified chronic kidney disease: Secondary | ICD-10-CM | POA: Diagnosis present

## 2023-07-05 DIAGNOSIS — Z8249 Family history of ischemic heart disease and other diseases of the circulatory system: Secondary | ICD-10-CM | POA: Diagnosis not present

## 2023-07-05 DIAGNOSIS — E669 Obesity, unspecified: Secondary | ICD-10-CM | POA: Insufficient documentation

## 2023-07-05 DIAGNOSIS — M810 Age-related osteoporosis without current pathological fracture: Secondary | ICD-10-CM | POA: Diagnosis present

## 2023-07-05 DIAGNOSIS — F32A Depression, unspecified: Secondary | ICD-10-CM | POA: Diagnosis present

## 2023-07-05 DIAGNOSIS — I10 Essential (primary) hypertension: Secondary | ICD-10-CM | POA: Diagnosis present

## 2023-07-05 DIAGNOSIS — Z833 Family history of diabetes mellitus: Secondary | ICD-10-CM

## 2023-07-05 DIAGNOSIS — Z820 Family history of epilepsy and other diseases of the nervous system: Secondary | ICD-10-CM

## 2023-07-05 DIAGNOSIS — R911 Solitary pulmonary nodule: Secondary | ICD-10-CM | POA: Diagnosis present

## 2023-07-05 DIAGNOSIS — Z860101 Personal history of adenomatous and serrated colon polyps: Secondary | ICD-10-CM

## 2023-07-05 DIAGNOSIS — N1831 Chronic kidney disease, stage 3a: Secondary | ICD-10-CM | POA: Diagnosis present

## 2023-07-05 DIAGNOSIS — Z801 Family history of malignant neoplasm of trachea, bronchus and lung: Secondary | ICD-10-CM

## 2023-07-05 DIAGNOSIS — Z7983 Long term (current) use of bisphosphonates: Secondary | ICD-10-CM | POA: Diagnosis not present

## 2023-07-05 DIAGNOSIS — Z87891 Personal history of nicotine dependence: Secondary | ICD-10-CM

## 2023-07-05 DIAGNOSIS — E7800 Pure hypercholesterolemia, unspecified: Secondary | ICD-10-CM | POA: Diagnosis present

## 2023-07-05 DIAGNOSIS — Z6831 Body mass index (BMI) 31.0-31.9, adult: Secondary | ICD-10-CM | POA: Diagnosis not present

## 2023-07-05 HISTORY — DX: Presence of prosthetic heart valve: Z95.2

## 2023-07-05 HISTORY — PX: INTRAOPERATIVE TRANSTHORACIC ECHOCARDIOGRAM: SHX6523

## 2023-07-05 HISTORY — DX: Nonrheumatic aortic (valve) stenosis: I35.0

## 2023-07-05 LAB — ECHOCARDIOGRAM LIMITED
AR max vel: 2.54 cm2
AV Area VTI: 2.81 cm2
AV Area mean vel: 2.68 cm2
AV Mean grad: 6 mm[Hg]
AV Peak grad: 11.4 mm[Hg]
Ao pk vel: 1.69 m/s
Area-P 1/2: 2.83 cm2
S' Lateral: 2.8 cm

## 2023-07-05 LAB — POCT I-STAT, CHEM 8
BUN: 11 mg/dL (ref 8–23)
Calcium, Ion: 1.17 mmol/L (ref 1.15–1.40)
Chloride: 100 mmol/L (ref 98–111)
Creatinine, Ser: 0.8 mg/dL (ref 0.44–1.00)
Glucose, Bld: 115 mg/dL — ABNORMAL HIGH (ref 70–99)
HCT: 31 % — ABNORMAL LOW (ref 36.0–46.0)
Hemoglobin: 10.5 g/dL — ABNORMAL LOW (ref 12.0–15.0)
Potassium: 3 mmol/L — ABNORMAL LOW (ref 3.5–5.1)
Sodium: 138 mmol/L (ref 135–145)
TCO2: 24 mmol/L (ref 22–32)

## 2023-07-05 LAB — BASIC METABOLIC PANEL
Anion gap: 9 (ref 5–15)
BUN: 12 mg/dL (ref 8–23)
CO2: 22 mmol/L (ref 22–32)
Calcium: 9 mg/dL (ref 8.9–10.3)
Chloride: 102 mmol/L (ref 98–111)
Creatinine, Ser: 0.88 mg/dL (ref 0.44–1.00)
GFR, Estimated: >60 mL/min (ref >60)
Glucose, Bld: 91 mg/dL (ref 70–99)
Potassium: 3.1 mmol/L — ABNORMAL LOW (ref 3.5–5.1)
Sodium: 133 mmol/L — ABNORMAL LOW (ref 135–145)

## 2023-07-05 LAB — ABO/RH: ABO/RH(D): O NEG

## 2023-07-05 SURGERY — TRANSCATHETER AORTIC VALVE REPLACEMENT, TRANSFEMORAL (CATHLAB)
Anesthesia: Monitor Anesthesia Care

## 2023-07-05 MED ORDER — HEPARIN (PORCINE) IN NACL 1000-0.9 UT/500ML-% IV SOLN
INTRAVENOUS | Status: DC | PRN
  Administered 2023-07-05: 1000 mL

## 2023-07-05 MED ORDER — POTASSIUM CHLORIDE CRYS ER 20 MEQ PO TBCR
40.0000 meq | EXTENDED_RELEASE_TABLET | Freq: Once | ORAL | Status: AC
  Administered 2023-07-05: 40 meq via ORAL
  Filled 2023-07-05: qty 2

## 2023-07-05 MED ORDER — LIDOCAINE HCL (PF) 1 % IJ SOLN
INTRAMUSCULAR | Status: AC
Start: 2023-07-05 — End: ?
  Filled 2023-07-05: qty 30

## 2023-07-05 MED ORDER — MIDAZOLAM HCL 2 MG/2ML IJ SOLN
INTRAMUSCULAR | Status: DC | PRN
  Administered 2023-07-05 (×2): .5 mg via INTRAVENOUS

## 2023-07-05 MED ORDER — ONDANSETRON HCL 4 MG/2ML IJ SOLN: 4.0000 mg | Freq: Four times a day (QID) | INTRAMUSCULAR | Status: DC | PRN

## 2023-07-05 MED ORDER — MIDAZOLAM HCL 2 MG/2ML IJ SOLN
INTRAMUSCULAR | Status: AC
  Filled 2023-07-05: qty 2

## 2023-07-05 MED ORDER — OXYCODONE HCL 5 MG PO TABS: 5.0000 mg | ORAL_TABLET | ORAL | Status: DC | PRN

## 2023-07-05 MED ORDER — ACETAMINOPHEN 500 MG PO TABS: 1000.0000 mg | ORAL_TABLET | Freq: Once | ORAL | Status: AC

## 2023-07-05 MED ORDER — LACTATED RINGERS IV SOLN: INTRAVENOUS | Status: DC | PRN

## 2023-07-05 MED ORDER — CHLORHEXIDINE GLUCONATE 4 % EX SOLN
30.0000 mL | CUTANEOUS | Status: DC
Start: 2023-07-05 — End: 2023-07-05

## 2023-07-05 MED ORDER — CHLORHEXIDINE GLUCONATE 0.12 % MT SOLN: 15.0000 mL | Freq: Once | OROMUCOSAL | Status: AC

## 2023-07-05 MED ORDER — FENTANYL CITRATE (PF) 100 MCG/2ML IJ SOLN
INTRAMUSCULAR | Status: DC | PRN
  Administered 2023-07-05 (×4): 25 ug via INTRAVENOUS

## 2023-07-05 MED ORDER — PROTAMINE SULFATE 10 MG/ML IV SOLN
INTRAVENOUS | Status: DC | PRN
  Administered 2023-07-05: 120 mg via INTRAVENOUS

## 2023-07-05 MED ORDER — SODIUM CHLORIDE 0.9 % IV SOLN: INTRAVENOUS | Status: AC

## 2023-07-05 MED ORDER — AMLODIPINE BESYLATE 10 MG PO TABS
10.0000 mg | ORAL_TABLET | Freq: Every day | ORAL | Status: DC
  Administered 2023-07-05 – 2023-07-06 (×2): 10 mg via ORAL
  Filled 2023-07-05: qty 1
  Filled 2023-07-05: qty 2

## 2023-07-05 MED ORDER — EZETIMIBE 10 MG PO TABS
10.0000 mg | ORAL_TABLET | Freq: Every day | ORAL | Status: DC
  Administered 2023-07-05 – 2023-07-06 (×2): 10 mg via ORAL
  Filled 2023-07-05 (×2): qty 1

## 2023-07-05 MED ORDER — LIDOCAINE HCL (PF) 1 % IJ SOLN
INTRAMUSCULAR | Status: DC | PRN
  Administered 2023-07-05: 30 mL

## 2023-07-05 MED ORDER — PROTAMINE SULFATE 10 MG/ML IV SOLN
INTRAVENOUS | Status: AC
  Filled 2023-07-05: qty 5

## 2023-07-05 MED ORDER — ASPIRIN 81 MG PO TBEC
81.0000 mg | DELAYED_RELEASE_TABLET | Freq: Every day | ORAL | Status: DC
  Administered 2023-07-06: 81 mg via ORAL
  Filled 2023-07-05 (×2): qty 1

## 2023-07-05 MED ORDER — SODIUM CHLORIDE 0.9 % IV SOLN: INTRAVENOUS | Status: DC

## 2023-07-05 MED ORDER — ATORVASTATIN CALCIUM 40 MG PO TABS
40.0000 mg | ORAL_TABLET | Freq: Every day | ORAL | Status: DC
  Administered 2023-07-05 – 2023-07-06 (×2): 40 mg via ORAL
  Filled 2023-07-05: qty 4
  Filled 2023-07-05: qty 1
  Filled 2023-07-05: qty 4

## 2023-07-05 MED ORDER — IOPAMIDOL (ISOVUE-370) INJECTION 76%
INTRAVENOUS | Status: DC | PRN
  Administered 2023-07-05: 30 mL

## 2023-07-05 MED ORDER — CHLORHEXIDINE GLUCONATE 4 % EX SOLN
60.0000 mL | Freq: Once | CUTANEOUS | Status: DC
Start: 2023-07-05 — End: 2023-07-05

## 2023-07-05 MED ORDER — CHLORHEXIDINE GLUCONATE 0.12 % MT SOLN
OROMUCOSAL | Status: AC
  Administered 2023-07-05: 15 mL via OROMUCOSAL
  Filled 2023-07-05: qty 15

## 2023-07-05 MED ORDER — SODIUM CHLORIDE 0.9% FLUSH
500.0000 mL | Freq: Once | INTRAVENOUS | Status: AC
  Administered 2023-07-05: 500 mL via INTRAVENOUS

## 2023-07-05 MED ORDER — ACETAMINOPHEN 325 MG PO TABS
650.0000 mg | ORAL_TABLET | Freq: Four times a day (QID) | ORAL | Status: DC | PRN
  Administered 2023-07-05: 650 mg via ORAL
  Filled 2023-07-05: qty 2

## 2023-07-05 MED ORDER — CEFAZOLIN SODIUM-DEXTROSE 2-4 GM/100ML-% IV SOLN
2.0000 g | Freq: Three times a day (TID) | INTRAVENOUS | Status: AC
Start: 2023-07-05 — End: 2023-07-05
  Administered 2023-07-05 (×2): 2 g via INTRAVENOUS
  Filled 2023-07-05 (×2): qty 100

## 2023-07-05 MED ORDER — BUPROPION HCL ER (XL) 300 MG PO TB24
300.0000 mg | ORAL_TABLET | Freq: Every day | ORAL | Status: DC
  Administered 2023-07-05 – 2023-07-06 (×2): 300 mg via ORAL
  Filled 2023-07-05 (×2): qty 1

## 2023-07-05 MED ORDER — LORATADINE 10 MG PO TABS
10.0000 mg | ORAL_TABLET | Freq: Every day | ORAL | Status: DC
  Administered 2023-07-05 – 2023-07-06 (×2): 10 mg via ORAL
  Filled 2023-07-05 (×2): qty 1

## 2023-07-05 MED ORDER — ACETAMINOPHEN 650 MG RE SUPP
650.0000 mg | Freq: Four times a day (QID) | RECTAL | Status: DC | PRN
  Filled 2023-07-05: qty 1

## 2023-07-05 MED ORDER — SODIUM CHLORIDE 0.9% FLUSH
3.0000 mL | Freq: Two times a day (BID) | INTRAVENOUS | Status: DC
  Administered 2023-07-06: 3 mL via INTRAVENOUS

## 2023-07-05 MED ORDER — SODIUM CHLORIDE 0.9 % IV SOLN: 250.0000 mL | INTRAVENOUS | Status: DC | PRN

## 2023-07-05 MED ORDER — PHENYLEPHRINE HCL (PRESSORS) 10 MG/ML IV SOLN
INTRAVENOUS | Status: DC | PRN
  Administered 2023-07-05 (×2): 80 ug via INTRAVENOUS

## 2023-07-05 MED ORDER — HEPARIN SODIUM (PORCINE) 1000 UNIT/ML IJ SOLN
INTRAMUSCULAR | Status: DC | PRN
  Administered 2023-07-05: 12000 [IU] via INTRAVENOUS

## 2023-07-05 MED ORDER — MOMETASONE FURO-FORMOTEROL FUM 100-5 MCG/ACT IN AERO
2.0000 | INHALATION_SPRAY | Freq: Two times a day (BID) | RESPIRATORY_TRACT | Status: DC
  Administered 2023-07-05 – 2023-07-06 (×2): 2 via RESPIRATORY_TRACT
  Filled 2023-07-05: qty 8.8

## 2023-07-05 MED ORDER — CITALOPRAM HYDROBROMIDE 20 MG PO TABS
20.0000 mg | ORAL_TABLET | Freq: Every day | ORAL | Status: DC
  Administered 2023-07-05 – 2023-07-06 (×2): 20 mg via ORAL
  Filled 2023-07-05 (×2): qty 1

## 2023-07-05 MED ORDER — CHLORHEXIDINE GLUCONATE 4 % EX SOLN: 60.0000 mL | Freq: Once | CUTANEOUS | Status: DC

## 2023-07-05 MED ORDER — ACETAMINOPHEN 500 MG PO TABS
ORAL_TABLET | ORAL | Status: AC
  Administered 2023-07-05: 1000 mg via ORAL
  Filled 2023-07-05: qty 2

## 2023-07-05 MED ORDER — FENTANYL CITRATE (PF) 100 MCG/2ML IJ SOLN
INTRAMUSCULAR | Status: AC
  Filled 2023-07-05: qty 2

## 2023-07-05 MED ORDER — NITROGLYCERIN IN D5W 200-5 MCG/ML-% IV SOLN
0.0000 ug/min | INTRAVENOUS | Status: DC
  Filled 2023-07-05: qty 250

## 2023-07-05 MED ORDER — SODIUM CHLORIDE 0.9% FLUSH: 3.0000 mL | INTRAVENOUS | Status: DC | PRN

## 2023-07-05 SURGICAL SUPPLY — 27 items
BAG SNAP BAND KOVER 36X36 (MISCELLANEOUS) ×2 IMPLANT
CATH 23 ULTRA DELIVERY (CATHETERS) IMPLANT
CATH INFINITI 5FR ANG PIGTAIL (CATHETERS) IMPLANT
CATH INFINITI 6F AL1 (CATHETERS) IMPLANT
CATH S G BIP PACING (CATHETERS) IMPLANT
CLOSURE MYNX CONTROL 6F/7F (Vascular Products) IMPLANT
CLOSURE PERCLOSE PROSTYLE (VASCULAR PRODUCTS) IMPLANT
CRIMPER (MISCELLANEOUS) IMPLANT
DEVICE INFLATION ATRION QL2530 (MISCELLANEOUS) IMPLANT
ELECT DEFIB PAD ADLT CADENCE (PAD) IMPLANT
KIT MICROPUNCTURE NIT STIFF (SHEATH) IMPLANT
KIT SAPIAN 3 ULTRA RESILIA 23 (Valve) IMPLANT
KIT SYRINGE INJ CVI SPIKEX1 (MISCELLANEOUS) IMPLANT
PACK CARDIAC CATHETERIZATION (CUSTOM PROCEDURE TRAY) ×1 IMPLANT
SET ATX-X65L (MISCELLANEOUS) IMPLANT
SHEATH BRITE TIP 7FR 35CM (SHEATH) IMPLANT
SHEATH INTRODUCER SET 20-26 (SHEATH) IMPLANT
SHEATH PINNACLE 6F 10CM (SHEATH) IMPLANT
SHEATH PINNACLE 8F 10CM (SHEATH) IMPLANT
SHEATH PROBE COVER 6X72 (BAG) IMPLANT
STOPCOCK MORSE 400PSI 3WAY (MISCELLANEOUS) ×2 IMPLANT
TRANSDUCER W/STOPCOCK (MISCELLANEOUS) IMPLANT
WIRE AMPLATZ SS-J .035X180CM (WIRE) IMPLANT
WIRE EMERALD 3MM-J .035X150CM (WIRE) IMPLANT
WIRE EMERALD 3MM-J .035X260CM (WIRE) IMPLANT
WIRE EMERALD ST .035X260CM (WIRE) IMPLANT
WIRE SAFARI SM CURVE 275 (WIRE) IMPLANT

## 2023-07-05 NOTE — Op Note (Signed)
[facility]   MULTIDISCIPLINARY HEART VALVE TEAM   TAVR OPERATIVE NOTE   Date of Procedure:  07/05/2023  Preoperative Diagnosis: Severe Aortic Stenosis   Postoperative Diagnosis: Same   Procedure:   Transcatheter Aortic Valve Replacement - Percutaneous Right Transfemoral Approach  Edwards Sapien 3 Ultra Resilia THV (size 23 mm, model # 9755RSL, serial # [id number])   Co-Surgeons:  Alleen Borne, MD and Verne Carrow, MD   Anesthesiologist:  Jolyne Loa, MD  Echocardiographer:  Lacretia Nicks. O'Neal, MD  Pre-operative Echo Findings: Severe aortic stenosis Normal left ventricular systolic function  Post-operative Echo Findings: No paravalvular leak Normal left ventricular systolic function   BRIEF CLINICAL NOTE AND INDICATIONS FOR SURGERY  This 77 year old woman has stage D, severe, symptomatic aortic stenosis with NYHA class II symptoms of exertional fatigue and shortness of breath consistent with chronic diastolic congestive heart failure as well as frequent episodes of dizziness with changing positions.  I have personally reviewed her 2D echocardiogram, cardiac catheterization, and CTA studies.  Her echocardiogram shows a severely calcified and thickened aortic valve with restricted leaflet mobility with a mean gradient of 39 mmHg and a valve area 1.07 cm consistent with severe aortic stenosis.  Cardiac catheterization showed mild nonobstructive coronary disease with normal right heart pressures.  I agree that aortic valve replacement is indicated for relief of her progressive symptoms and to prevent left ventricular dysfunction.  Given her age I think transcatheter aortic valve replacement would be the best option for treating her.  Her gated cardiac CTA shows anatomy suitable for TAVR using a 23 mm Edwards SAPIEN 3 valve.  Her abdominal and pelvic CTA shows adequate pelvic vascular anatomy to allow transfemoral insertion.   The patient was counseled at length  regarding treatment alternatives for management of severe symptomatic aortic stenosis. The risks and benefits of surgical intervention has been discussed in detail. Long-term prognosis with medical therapy was discussed. Alternative approaches such as conventional surgical aortic valve replacement, transcatheter aortic valve replacement, and palliative medical therapy were compared and contrasted at length. This discussion was placed in the context of the patient's own specific clinical presentation and past medical history. All of her questions have been addressed.    Following the decision to proceed with transcatheter aortic valve replacement, a discussion was held regarding what types of management strategies would be attempted intraoperatively in the event of life-threatening complications, including whether or not the patient would be considered a candidate for the use of cardiopulmonary bypass and/or conversion to open sternotomy for attempted surgical intervention.  She is in overall good condition for her age and I think she would be a candidate for emergent sternotomy to manage any intraoperative complications.  The patient is aware of the fact that transient use of cardiopulmonary bypass may be necessary. The patient has been advised of a variety of complications that might develop including but not limited to risks of death, stroke, paravalvular leak, aortic dissection or other major vascular complications, aortic annulus rupture, device embolization, cardiac rupture or perforation, mitral regurgitation, acute myocardial infarction, arrhythmia, heart block or bradycardia requiring permanent pacemaker placement, congestive heart failure, respiratory failure, renal failure, pneumonia, infection, other late complications related to structural valve deterioration or migration, or other complications that might ultimately cause a temporary or permanent loss of functional independence or other long term  morbidity. The patient provides full informed consent for the procedure as described and all questions were answered.       DETAILS OF THE OPERATIVE  PROCEDURE  PREPARATION:    The patient was brought to the operating room on the above mentioned date and appropriate monitoring was established by the anesthesia team. The patient was placed in the supine position on the operating table.  Intravenous antibiotics were administered. The patient was monitored closely throughout the procedure under conscious sedation.  Baseline transthoracic echocardiogram was performed. The patient's abdomen and both groins were prepped and draped in a sterile manner. A time out procedure was performed.   PERIPHERAL ACCESS:    Using the modified Seldinger technique, femoral arterial and venous access was obtained with placement of 6 Fr sheaths on the left side.  A pigtail diagnostic catheter was passed through the left arterial sheath under fluoroscopic guidance into the aortic root.  A temporary transvenous pacemaker catheter was passed through the left femoral venous sheath under fluoroscopic guidance into the right ventricle.  The pacemaker was tested to ensure stable lead placement and pacemaker capture. Aortic root angiography was performed in order to determine the optimal angiographic angle for valve deployment.   TRANSFEMORAL ACCESS:   Percutaneous transfemoral access and sheath placement was performed using ultrasound guidance.  The right common femoral artery was cannulated using a micropuncture needle and appropriate location was verified using hand injection angiogram.  A pair of Abbott Perclose percutaneous closure devices were placed and a 6 French sheath replaced into the femoral artery.  The patient was heparinized systemically and ACT verified > 250 seconds.    A 14 Fr transfemoral E-sheath was introduced into the right common femoral artery after progressively dilating over an Amplatz superstiff  wire. An AL-1 catheter was used to direct a straight-tip exchange length wire across the native aortic valve into the left ventricle. This was exchanged out for a pigtail catheter and position was confirmed in the LV apex. Simultaneous LV and Ao pressures were recorded.  The pigtail catheter was exchanged for a Safari wire in the LV apex.   BALLOON AORTIC VALVULOPLASTY:   Not performed  TRANSCATHETER HEART VALVE DEPLOYMENT:   An Edwards Sapien 3 Ultra transcatheter heart valve (size 23 mm) was prepared and crimped per manufacturer's guidelines, and the proper orientation of the valve is confirmed on the [employer] delivery system. The valve was advanced through the introducer sheath using normal technique until in an appropriate position in the abdominal aorta beyond the sheath tip. The balloon was then retracted and using the fine-tuning wheel was centered on the valve. The valve was then advanced across the aortic arch using appropriate flexion of the catheter. The valve was carefully positioned across the aortic valve annulus. The Commander catheter was retracted using normal technique. Once final position of the valve has been confirmed by angiographic assessment, the valve is deployed during rapid ventricular pacing to maintain systolic blood pressure < 50 mmHg and pulse pressure < 10 mmHg. The balloon inflation is held for >3 seconds after reaching full deployment volume. Once the balloon has fully deflated the balloon is retracted into the ascending aorta and valve function is assessed using echocardiography. There is felt to be no paravalvular leak and no central aortic insufficiency.  The patient's hemodynamic recovery following valve deployment is good.  The deployment balloon and guidewire are both removed.    PROCEDURE COMPLETION:   The sheath was removed and femoral artery closure performed.  Protamine was administered once femoral arterial repair was complete. The temporary  pacemaker, pigtail catheter and femoral sheaths were removed with manual pressure used for venous hemostasis.  A Mynx femoral closure device was utilized following removal of the diagnostic sheath in the left femoral artery.  The patient tolerated the procedure well and is transported to the cath lab recovery area in stable condition. There were no immediate intraoperative complications. All sponge instrument and needle counts are verified correct at completion of the operation.   No blood products were administered during the operation.  The patient received a total of 30 mL of intravenous contrast during the procedure.   Alleen Borne, MD 07/05/2023 9:43 AM

## 2023-07-05 NOTE — Progress Notes (Signed)
Carlean Jews, PA to bedside, informed of decreased K+ level

## 2023-07-05 NOTE — CV Procedure (Signed)
HEART AND VASCULAR CENTER  TAVR OPERATIVE NOTE   Date of Procedure:  07/05/2023  Preoperative Diagnosis: Severe Aortic Stenosis   Postoperative Diagnosis: Same   Procedure:   Transcatheter Aortic Valve Replacement - Transfemoral Approach  Edwards Sapien 3 Ultra Resilia THV (size 23 mm, model # [id number], serial # [id number] )   Co-Surgeons:  Verne Carrow, MD and Evelene Croon , MD   Anesthesiologist:  Renold Don  Echocardiographer:  O'Neal  Pre-operative Echo Findings: Severe aortic stenosis Normal left ventricular systolic function  Post-operative Echo Findings: No paravalvular leak Normal left ventricular systolic function  BRIEF CLINICAL NOTE AND INDICATIONS FOR SURGERY  77 yo female wit history of HTN, HLD, asthma and severe aortic stenosis here today for TAVR. Her most recent echo on 06/02/2023 showed a calcified and thickened aortic valve with a mean gradient of 39 mmHg and a peak gradient of 69 mmHg.  Aortic valve area by VTI was 1.07 cm.  Left ventricular ejection fraction was 60 to 65% with grade 1 diastolic dysfunction.  She reports progressive exertional shortness of breath and fatigue   During the course of the patient's preoperative work up they have been evaluated comprehensively by a multidisciplinary team of specialists coordinated through the Multidisciplinary Heart Valve Clinic in the [facility].  They have been demonstrated to suffer from symptomatic severe aortic stenosis as noted above. The patient has been counseled extensively as to the relative risks and benefits of all options for the treatment of severe aortic stenosis including long term medical therapy, conventional surgery for aortic valve replacement, and transcatheter aortic valve replacement.  The patient has been independently evaluated by Dr. Laneta Simmers with CT surgery and they are felt to be at high risk for conventional surgical aortic valve replacement. The surgeon  indicated the patient would be a poor candidate for conventional surgery. Based upon review of all of the patient's preoperative diagnostic tests they are felt to be candidate for transcatheter aortic valve replacement using the transfemoral approach as an alternative to high risk conventional surgery.    Following the decision to proceed with transcatheter aortic valve replacement, a discussion has been held regarding what types of management strategies would be attempted intraoperatively in the event of life-threatening complications, including whether or not the patient would be considered a candidate for the use of cardiopulmonary bypass and/or conversion to open sternotomy for attempted surgical intervention.  The patient has been advised of a variety of complications that might develop peculiar to this approach including but not limited to risks of death, stroke, paravalvular leak, aortic dissection or other major vascular complications, aortic annulus rupture, device embolization, cardiac rupture or perforation, acute myocardial infarction, arrhythmia, heart block or bradycardia requiring permanent pacemaker placement, congestive heart failure, respiratory failure, renal failure, pneumonia, infection, other late complications related to structural valve deterioration or migration, or other complications that might ultimately cause a temporary or permanent loss of functional independence or other long term morbidity.  The patient provides full informed consent for the procedure as described and all questions were answered preoperatively.    DETAILS OF THE OPERATIVE PROCEDURE  PREPARATION:   The patient is brought to the operating room on the above mentioned date and central monitoring was established by the anesthesia team including placement of a radial arterial line. The patient is placed in the supine position on the operating table.  Intravenous antibiotics are administered. Conscious sedation is  used.   Baseline transthoracic echocardiogram was performed. The patient's chest,  abdomen, both groins, and both lower extremities are prepared and draped in a sterile manner. A time out procedure is performed.   PERIPHERAL ACCESS:   Using the modified Seldinger technique, femoral arterial and venous access were obtained with placement of a 6 Fr sheath in the artery and a 7 Fr sheath in the vein on the left side using u/s guidance.  A pigtail diagnostic catheter was passed through the femoral arterial sheath under fluoroscopic guidance into the aortic root.  A temporary transvenous pacemaker catheter was passed through the femoral venous sheath under fluoroscopic guidance into the right ventricle.  The pacemaker was tested to ensure stable lead placement and pacemaker capture. Aortic root angiography was performed in order to determine the optimal angiographic angle for valve deployment.  TRANSFEMORAL ACCESS:  A micropuncture kit was used to gain access to the right femoral artery using u/s guidance. Position confirmed with angiography. Pre-closure with double ProGlide closure devices. The patient was heparinized systemically and ACT verified > 250 seconds.    A 14 Fr transfemoral E-sheath was introduced into the right femoral artery after progressively dilating over an Amplatz superstiff wire. An AL-1 catheter was used to direct a straight-tip exchange length wire across the native aortic valve into the left ventricle. This was exchanged out for a pigtail catheter and position was confirmed in the LV apex. Simultaneous LV and Ao pressures were recorded.  The pigtail catheter was then exchanged for a Safari wire in the LV apex.   TRANSCATHETER HEART VALVE DEPLOYMENT:  An Edwards Sapien 3 Ultra Resilia THV (size 23 mm) was prepared and crimped per manufacturer's guidelines, and the proper orientation of the valve is confirmed on the [employer] delivery system. The valve was advanced through the  introducer sheath using normal technique until in an appropriate position in the abdominal aorta beyond the sheath tip. The balloon was then retracted and using the fine-tuning wheel was centered on the valve. The valve was then advanced across the aortic arch using appropriate flexion of the catheter. The valve was carefully positioned across the aortic valve annulus. The Commander catheter was retracted using normal technique. Once final position of the valve has been confirmed by angiographic assessment, the valve is deployed while temporarily holding ventilation and during rapid ventricular pacing to maintain systolic blood pressure < 50 mmHg and pulse pressure < 10 mmHg. The balloon inflation is held for >3 seconds after reaching full deployment volume. Once the balloon has fully deflated the balloon is retracted into the ascending aorta and valve function is assessed using TTE. There is felt to be no paravalvular leak and no central aortic insufficiency.  The patient's hemodynamic recovery following valve deployment is good.  The deployment balloon and guidewire are both removed. Echo demostrated acceptable post-procedural gradients, stable mitral valve function, and no AI.   PROCEDURE COMPLETION:  The sheath was then removed and closure devices were completed. Protamine was administered once femoral arterial repair was complete. The temporary pacemaker, pigtail catheters and femoral sheaths were removed with a Mynx closure device placed in the artery and manual pressure used for venous hemostasis.    The patient tolerated the procedure well and is transported to the surgical intensive care in stable condition. There were no immediate intraoperative complications. All sponge instrument and needle counts are verified correct at completion of the operation.   No blood products were administered during the operation.  The patient received a total of 30 mL of intravenous contrast during the  procedure.  LVEDP: 18 mmHg  Verne Carrow MD, [facility] 07/05/2023 9:14 AM

## 2023-07-05 NOTE — Anesthesia Postprocedure Evaluation (Signed)
Anesthesia Post Note  Patient: Samantha Davidson  Procedure(s) Performed: Transcatheter Aortic Valve Replacement, Transfemoral INTRAOPERATIVE TRANSTHORACIC ECHOCARDIOGRAM     Patient location during evaluation: PACU Anesthesia Type: MAC Level of consciousness: awake and alert Pain management: pain level controlled Vital Signs Assessment: post-procedure vital signs reviewed and stable Respiratory status: spontaneous breathing, nonlabored ventilation, respiratory function stable and patient connected to nasal cannula oxygen Cardiovascular status: stable and blood pressure returned to baseline Postop Assessment: no apparent nausea or vomiting Anesthetic complications: no   There were no known notable events for this encounter.  Last Vitals:  Vitals:   07/05/23 1541 07/05/23 1542  BP: 137/73 137/73  Pulse:  71  Resp:  14  Temp: 36.8 C   SpO2:  99%    Last Pain:  Vitals:   07/05/23 1541  TempSrc: Oral  PainSc:                  Lewie Loron

## 2023-07-05 NOTE — Progress Notes (Signed)
  Echocardiogram 2D Echocardiogram has been performed.  Samantha Davidson 07/05/2023, 9:04 AM

## 2023-07-05 NOTE — Discharge Summary (Incomplete)
HEART AND VASCULAR CENTER   MULTIDISCIPLINARY HEART VALVE TEAM  Discharge Summary    Patient ID: Samantha Davidson MRN: [id number]; DOB: 1945-09-15  Admit date: 07/05/2023 Discharge date: 07/06/2023  Primary Care Provider: Gerre Scull, NP  Primary Cardiologist: Christell Constant, MD / Dr. Clifton James & Dr. Laneta Simmers (TAVR)  Discharge Diagnoses    Principal Problem:   S/P TAVR (transcatheter aortic valve replacement) Active Problems:   HTN (hypertension)   Aortic valve stenosis   Pure hypercholesterolemia   Obesity (BMI 30-39.9)   Allergies Allergies  Allergen Reactions   Codeine Nausea And Vomiting   Doxycycline Diarrhea    Diagnostic Studies/Procedures    HEART AND VASCULAR CENTER  TAVR OPERATIVE NOTE     Date of Procedure:                07/05/2023   Preoperative Diagnosis:      Severe Aortic Stenosis    Postoperative Diagnosis:    Same    Procedure:        Transcatheter Aortic Valve Replacement - Transfemoral Approach             Edwards Sapien 3 Ultra Resilia THV (size 23 mm, model # [id number], serial # [id number] )              Co-Surgeons:                        Verne Carrow, MD and Evelene Croon , MD    Anesthesiologist:                  Renold Don   Echocardiographer:              O'Neal   Pre-operative Echo Findings: Severe aortic stenosis Normal left ventricular systolic function   Post-operative Echo Findings: No paravalvular leak Normal left ventricular systolic function _____________    Echo 07/06/23: completed but pending formal read at the time of discharge   History of Present Illness     Samantha Davidson is a 77 y.o. female with a history of HTN, HLD, carotid artery disease, previous fall with cervical spine fracture, CKD stage IIIa, asthma, obesity (BMI 30), and severe aortic stenosis who presented to [facility] on 07/05/23 for planned TAVR.   She was previously living in [place] and followed by  cardiology there for aortic stenosis. She subsequently sold that house and moved to [facility] but comes to [place] for her medical care since she had previously lived in [place] for many years. Her most recent echo on 06/02/2023 showed  EF 60-65% and severe AS with a mean grad 39 mmHg & AVA 1.07 cm2. She reported progressive exertional shortness of breath and fatigue. Cardiac catheterization 06/16/23 showed mild non-obstructive coronary artery disease and normal right heart pressures.  She was evaluated by the multidisciplinary valve team and felt to have severe, symptomatic aortic stenosis and to be a suitable candidate for TAVR, which was set up for 07/05/23.  Hospital Course     Consultants: none   Severe AS: s/p successful TAVR with a 23 mm Edwards Sapien 3 Ultra Resilia THV via the TF approach on 07/05/23. Post operative echo completed but pending formal read. Groin sites are stable. Left groin required lido/epi injection. ECG with NSR and no high grade heart block. Continued on home Asprin 81mg  daily. Walked with cardiac rehab with no issues. Plan for discharge home today with close follow up in the outpatient setting.  HTN: BP well controlled. Resumed on home Norvasc 10mg  daily, chlorthalidone 25mg  daily, valsartan 40mg  daily,   HLD: continued on Lipitor 40mg  daily and Zetia 10 mg daily.  Hypokalemia: PAT labs showed K of 2.7. Started on Kdur daily. Will repeat BMET in outpatient setting.   Pulmonary nodule: pre TAVR CT showed a "new right upper lobe part solid nodule. Follow-up non-contrast CT recommended at 3-6 months to confirm persistence. If unchanged, and solid component remains <6 mm, annual CT is recommended until 5 years of stability has been established. If persistent these nodules should be considered highly suspicious if the solid component of the nodule is 6 mm or greater in size and enlarging. Additional small part solid nodule of the left upper lobe, likely  unchanged when compared with 2019 prior. Recommend attention on follow-up." Will discuss in the outpatient setting.   _____________  Discharge Vitals Blood pressure (!) 148/74, pulse 70, temperature 99.1 F (37.3 C), temperature source Oral, resp. rate 18, height 5' 3.5" (1.613 m), weight 82.7 kg, SpO2 96%.  Filed Weights   07/05/23 0551 07/06/23 0556  Weight: 79.4 kg 82.7 kg    GEN: Well nourished, well developed, in no acute distress HEENT: normal Neck: no JVD or masses Cardiac: RRR; no murmurs, rubs, or gallops,no edema  Respiratory:  clear to auscultation bilaterally, normal work of breathing GI: soft, nontender, nondistended, + BS MS: no deformity or atrophy Skin: warm and dry, no rash.  Groin sites clear without hematoma or ecchymosis  Neuro:  Alert and Oriented x 3, Strength and sensation are intact Psych: euthymic mood, full affect   Disposition   Pt is being discharged home today in good condition.  Follow-up Plans & Appointments     Follow-up Information     Janetta Hora, PA-C. Go on 07/13/2023.   Specialties: Cardiology, Radiology Why: @ 1:05, please arrive at least 10 minutes early Contact information: [address] [place] [address] [phone]                Discharge Instructions     Amb Referral to Cardiac Rehabilitation   Complete by: As directed    Diagnosis: Valve Replacement   Valve: Aortic   After initial evaluation and assessments completed: Virtual Based Care may be provided alone or in conjunction with Phase 2 Cardiac Rehab based on patient barriers.: Yes   Intensive Cardiac Rehabilitation (ICR) [facility] location only OR Traditional Cardiac Rehabilitation (TCR) *If criteria for ICR are not met will enroll in TCR [facility] only): Yes       Discharge Medications   Allergies as of 07/06/2023       Reactions   Codeine Nausea And Vomiting   Doxycycline Diarrhea        Medication List     TAKE these  medications    albuterol 108 (90 Base) MCG/ACT inhaler Commonly known as: VENTOLIN HFA Inhale 1-2 puffs into the lungs every 6 (six) hours as needed for wheezing or shortness of breath.   alendronate 70 MG tablet Commonly known as: FOSAMAX Take 1 tablet (70 mg total) by mouth once a week.   amLODipine 10 MG tablet Commonly known as: NORVASC Take 10 mg by mouth daily.   aspirin EC 81 MG tablet Take 81 mg by mouth daily.   atorvastatin 40 MG tablet Commonly known as: LIPITOR Take 1 tablet (40 mg total) by mouth daily.   buPROPion 300 MG 24 hr tablet Commonly known as: WELLBUTRIN XL 1  po qd   chlorthalidone 25 MG tablet Commonly known as: HYGROTON Take 1 tablet (25 mg total) by mouth daily.   cholecalciferol 25 MCG (1000 UNIT) tablet Commonly known as: VITAMIN D3 Take 1,000 Units by mouth daily.   citalopram 20 MG tablet Commonly known as: CELEXA Take 1 tablet (20 mg total) by mouth daily.   EPINEPHrine 0.3 mg/0.3 mL Devi Commonly known as: EpiPen Inject 0.3 mLs (0.3 mg total) into the muscle once.   ezetimibe 10 MG tablet Commonly known as: ZETIA Take 1 tablet (10 mg total) by mouth daily.   famotidine 40 MG tablet Commonly known as: PEPCID Take 1 tablet (40 mg total) by mouth at bedtime. What changed:  when to take this reasons to take this   loratadine 10 MG tablet Commonly known as: CLARITIN Take 10 mg by mouth daily.   MULTIVITAMIN PO Take 1 tablet by mouth daily.   potassium chloride SA 20 MEQ tablet Commonly known as: KLOR-CON M Take 1 tablet (20 mEq total) by mouth daily.   valsartan 40 MG tablet Commonly known as: DIOVAN Take 1 tablet (40 mg total) by mouth daily.   Wixela Inhub 100-50 MCG/ACT Aepb Generic drug: fluticasone-salmeterol Inhale 1 puff into the lungs 2 (two) times daily.         Outstanding Labs/Studies   BMET  Duration of Discharge Encounter   Greater than 30 minutes including physician time.  Byrd Hesselbach, PA-C 07/06/2023, 10:01 AM [phone]   Chart reviewed, patient examined, agree with above. She feels better since valve replacement. Walking without difficulty. Rhythm stable. 2D echo reviewed and looks fine. Mean AV gradient 13 mm Hg, no paravalvular leak. EF normal. She can go home today.  Alleen Borne, MD.

## 2023-07-05 NOTE — Progress Notes (Signed)
Mobility Specialist Progress Note:   07/05/23 1600  Mobility  Activity Ambulated with assistance in hallway  Level of Assistance Contact guard assist, steadying assist  Assistive Device Front wheel walker  Distance Ambulated (ft) 325 ft  Activity Response Tolerated well  Mobility Referral Yes  $Mobility charge 1 Mobility  Mobility Specialist Start Time (ACUTE ONLY) 1545  Mobility Specialist Stop Time (ACUTE ONLY) 1600  Mobility Specialist Time Calculation (min) (ACUTE ONLY) 15 min   During Mobility: 96 HR  Post Mobility: 69 HR ,  Pt received in bed, agreeable to mobility. Groin incision sites stable. Pt c/o slight dizziness upon standing, otherwise asx throughout. VSS. Pt returned to bed with call bell in reach and all needs met. RN notified.    Leory Plowman  Mobility Specialist Please contact via [employer] office at [phone]

## 2023-07-05 NOTE — Progress Notes (Signed)
Carlean Jews, PA informed of decreased BP, order obtained, see MAR, safety maintained

## 2023-07-05 NOTE — Progress Notes (Signed)
  HEART AND VASCULAR CENTER   MULTIDISCIPLINARY HEART VALVE TEAM  Patient doing well s/p TAVR. She is hemodynamically stable but BP on soft side. This improved after 500 cc NS bolus. Groin sites stable. ECG with sinus brady but no high grade block.  Plan to transfer to from cath lab holding to 4E when bed available.  Early ambulation after bedrest completed and hopeful discharge over the next 24-48 hours.   Cline Crock PA-C  [facility]  Pager [phone]

## 2023-07-05 NOTE — Discharge Instructions (Signed)
ACTIVITY AND EXERCISE  Daily activity and exercise are an important part of your recovery. People recover at different rates depending on their general health and type of valve procedure.  Most people recovering from TAVR feel better relatively quickly   No lifting, pushing, pulling more than 10 pounds (examples to avoid: groceries, vacuuming, gardening, golfing):             - For one week with a procedure through the groin.             - For six weeks for procedures through the chest wall or neck. NOTE: You will typically see one of our providers 7-14 days after your procedure to discuss WHEN TO RESUME the above activities.      DRIVING  Do not drive until you are seen for follow up and cleared by a provider. Generally, we ask patient to not drive for 1 week after their procedure.  If you have been told by your doctor in the past that you may not drive, you must talk with him/her before you begin driving again.   DRESSING  Groin site: you may leave the clear dressing over the site for up to one week or until it falls off.   HYGIENE  If you had a femoral (leg) procedure, you may take a shower when you return home. After the shower, pat the site dry. Do NOT use powder, oils or lotions in your groin area until the site has completely healed.  If you had a chest procedure, you may shower when you return home unless specifically instructed not to by your discharging practitioner.             - DO NOT scrub incision; pat dry with a towel.             - DO NOT apply any lotions, oils, powders to the incision.             - No tub baths / swimming for at least 2 weeks.  If you notice any fevers, chills, increased pain, swelling, bleeding or pus, please contact your doctor.   ADDITIONAL INFORMATION  If you are going to have an upcoming dental procedure, please contact our office as you will require antibiotics ahead of time to prevent infection on your heart valve.    If you have any questions  or concerns you can call the structural heart phone during normal business hours 8am-4pm. If you have an urgent need after hours or weekends please call [phone] to talk to the on call provider for general cardiology. If you have an emergency that requires immediate attention, please call 911.    After TAVR Checklist  Check  Test Description   Follow up appointment in 1-2 weeks  You will see our structural heart advanced practice provider. Your incision sites will be checked and you will be cleared to drive and resume all normal activities if you are doing well.     1 month echo and follow up  You will have an echo to check on your new heart valve and be seen back in the office by a structural heart advanced practice provider.   Follow up with your primary cardiologist You will need to be seen by your primary cardiologist in the following 3-6 months after your 1 month appointment in the valve clinic.    1 year echo and follow up You will have another echo to check on your heart valve after 1 year  and be seen back in the office by a structural heart advanced practice provider. This your last structural heart visit.   Bacterial endocarditis prophylaxis  You will have to take antibiotics for the rest of your life before all dental procedures (even teeth cleanings) to protect your heart valve. Antibiotics are also required before some surgeries. Please check with your cardiologist before scheduling any surgeries. Also, please make sure to tell us if you have a penicillin allergy as you will require an alternative antibiotic.

## 2023-07-05 NOTE — Progress Notes (Signed)
  HEART AND VASCULAR CENTER   MULTIDISCIPLINARY HEART VALVE TEAM  Called to bedside due to persistent left groin oozing. I injected with lido/epi and treated with silver nitrate and 5 min manual pressure with good hemostasis. Bed rest for another hour. Given Kdur for potassium of 3.0.  Cline Crock PA-C  [facility]  Pager [phone]

## 2023-07-05 NOTE — Transfer of Care (Signed)
/  Immediate Anesthesia Transfer of Care Note  Patient: Samantha Davidson  Procedure(s) Performed: Transcatheter Aortic Valve Replacement, Transfemoral INTRAOPERATIVE TRANSTHORACIC ECHOCARDIOGRAM  Patient Location: Cath Lab  Anesthesia Type:MAC  Level of Consciousness: awake, alert , and oriented  Airway & Oxygen Therapy: Patient Spontanous Breathing  Post-op Assessment: Report given to RN and Post -op Vital signs reviewed and stable  Post vital signs: Reviewed and stable  Last Vitals:  Vitals Value Taken Time  BP 90/60   Temp 98   Pulse 60 07/05/23 0916  Resp 16   SpO2 93     Last Pain:  Vitals:   07/05/23 0853  TempSrc:   PainSc: 0-No pain         Complications: There were no known notable events for this encounter.

## 2023-07-05 NOTE — Interval H&P Note (Signed)
History and Physical Interval Note:  07/05/2023 6:22 AM  [name]  has presented today for surgery, with the diagnosis of Severe Aortic Stenosis.  The various methods of treatment have been discussed with the patient and family. After consideration of risks, benefits and other options for treatment, the patient has consented to  Procedure(s): Transcatheter Aortic Valve Replacement, Transfemoral (N/A) INTRAOPERATIVE TRANSTHORACIC ECHOCARDIOGRAM (N/A) as a surgical intervention.  The patient's history has been reviewed, patient examined, no change in status, stable for surgery.  I have reviewed the patient's chart and labs.  Questions were answered to the patient's satisfaction.     Samantha Davidson

## 2023-07-06 ENCOUNTER — Inpatient Hospital Stay (HOSPITAL_COMMUNITY): Payer: Medicare Other

## 2023-07-06 DIAGNOSIS — Z954 Presence of other heart-valve replacement: Secondary | ICD-10-CM | POA: Diagnosis not present

## 2023-07-06 DIAGNOSIS — Z952 Presence of prosthetic heart valve: Secondary | ICD-10-CM

## 2023-07-06 DIAGNOSIS — I350 Nonrheumatic aortic (valve) stenosis: Secondary | ICD-10-CM | POA: Diagnosis not present

## 2023-07-06 LAB — BASIC METABOLIC PANEL
Anion gap: 7 (ref 5–15)
BUN: 7 mg/dL — ABNORMAL LOW (ref 8–23)
CO2: 27 mmol/L (ref 22–32)
Calcium: 8.9 mg/dL (ref 8.9–10.3)
Chloride: 103 mmol/L (ref 98–111)
Creatinine, Ser: 0.9 mg/dL (ref 0.44–1.00)
GFR, Estimated: >60 mL/min (ref >60)
Glucose, Bld: 109 mg/dL — ABNORMAL HIGH (ref 70–99)
Potassium: 3.6 mmol/L (ref 3.5–5.1)
Sodium: 137 mmol/L (ref 135–145)

## 2023-07-06 LAB — CBC
HCT: 34.9 % — ABNORMAL LOW (ref 36.0–46.0)
Hemoglobin: 11.7 g/dL — ABNORMAL LOW (ref 12.0–15.0)
MCH: 32.5 pg (ref 26.0–34.0)
MCHC: 33.5 g/dL (ref 30.0–36.0)
MCV: 96.9 fL (ref 80.0–100.0)
Platelets: 168 10*3/uL (ref 150–400)
RBC: 3.6 MIL/uL — ABNORMAL LOW (ref 3.87–5.11)
RDW: 11.9 % (ref 11.5–15.5)
WBC: 7.8 10*3/uL (ref 4.0–10.5)
nRBC: 0 % (ref 0.0–0.2)

## 2023-07-06 LAB — MAGNESIUM: Magnesium: 1.7 mg/dL (ref 1.7–2.4)

## 2023-07-06 NOTE — Progress Notes (Signed)
  Echocardiogram 2D Echocardiogram has been performed.  Milda Smart 07/06/2023, 9:08 AM

## 2023-07-06 NOTE — Progress Notes (Signed)
Alert and oriented , verbalized understanding of dc instructions.all belongings and dc papers given to patient

## 2023-07-06 NOTE — Progress Notes (Signed)
CARDIAC REHAB PHASE I   PRE:  Rate/Rhythm: 69 SR  BP:  Sitting: 148/74      SaO2: 98 RA  MODE:  Ambulation: 470 ft   POST:  Rate/Rhythm: 88 SR  BP:  Sitting: 155/74      SaO2: 96 RA  Pt ambulated in hallway independently. Moving at slow steady pace. Returned to bed with call bell and bedside table in reach. Post TAVR education including site care, restrictions, heart healthy diet, exercise guidelines and CRP2 reviewed. All questions and concerns addressed. Will refer to CRP2 in [place]. Will find closest CRP2 facility to pt and send referral. Plan for home later today.   [id number]  Woodroe Chen, RN BSN 07/06/2023 9:52 AM

## 2023-07-08 ENCOUNTER — Telehealth: Payer: Self-pay | Admitting: Physician Assistant

## 2023-07-08 NOTE — Telephone Encounter (Signed)
  [facility]ART AND VASCULAR CENTER   MULTIDISCIPLINARY HEART VALVE TEAM   Patient contacted regarding discharge from [facility] on 07/06/23  Patient understands to follow up with a structural heart APP on 07/13/23 at [address] [facility].  Patient understands discharge instructions? yes Patient understands medications and regimen? yes Patient understands to bring all medications to this visit? yes  Cline Crock PA-C  [facility]   ,

## 2023-07-11 LAB — ECHOCARDIOGRAM COMPLETE
AR max vel: 1.42 cm2
AV Area VTI: 1.57 cm2
AV Area mean vel: 1.5 cm2
AV Mean grad: 13 mm[Hg]
AV Peak grad: 25.6 mm[Hg]
Ao pk vel: 2.53 m/s
Calc EF: 66.9 %
Height: 63.5 in
MV M vel: 4.26 m/s
MV Peak grad: 72.4 mm[Hg]
MV VTI: 2.09 cm2
S' Lateral: 2.7 cm
Single Plane A2C EF: 69.8 %
Single Plane A4C EF: 65.3 %
Weight: 2918.4 [oz_av]

## 2023-07-11 NOTE — Progress Notes (Unsigned)
HEART AND VASCULAR CENTER   MULTIDISCIPLINARY HEART VALVE CLINIC                                     Cardiology Office Note:    Date:  07/13/2023   ID:  Samantha Davidson, DOB March 17, 1946, MRN [id number]  PCP:  Gerre Scull, NP  [facility] Cardiologist:  Christell Constant, MD  / Dr. Clifton James & Dr. Laneta Simmers (TAVR)  [facility] Electrophysiologist:  None   Referring MD: Gerre Scull, NP   TOC s/p TAVR  History of Present Illness:    Samantha Davidson is a 77 y.o. female with a hx of HTN, HLD, carotid artery disease, previous fall with cervical spine fracture, CKD stage IIIa, asthma, obesity (BMI 30), and severe aortic stenosis s/p TAVR (07/05/23) who presents to clinic for follow up.  She was previously living in [place] and followed by cardiology there for aortic stenosis. She subsequently sold that house and moved to [facility] but comes to [place] for her medical care since she had previously lived in [place] for many years. Her most recent echo on 06/02/2023 showed  EF 60-65% and severe AS with a mean grad 39 mmHg & AVA 1.07 cm2. She reported progressive exertional shortness of breath and fatigue. Cardiac catheterization 06/16/23 showed mild non-obstructive coronary artery disease and normal right heart pressures.   She was evaluated by the multidisciplinary valve team and underwent  successful TAVR with a 23 mm Edwards Sapien 3 Ultra Resilia THV via the TF approach on 07/05/23. Post operative echo showed EF 60%, normally functioning TAVR with a mean gradient of 13 mmHg and no PVL as well as moderate MAC with mild MS. She was continued on home baby aspirin 81mg  daily.   Today the patient presents to clinic for follow up. No CP or SOB. No LE edema, orthopnea or PND. Has an occasional dizzy spell with standing or bending over. Dizziness much improved since TAVR. No syncope. No blood in stool or urine. No palpitations. Feels so much better  since TAVR. Family says her coloring Is much improved.     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 Aundra Dubin & Dr. Laneta Simmers   Severe aortic stenosis    Tubular adenoma of colon      Current Medications: Current Meds  Medication Sig   albuterol (VENTOLIN HFA) 108 (90 Base) MCG/ACT inhaler Inhale 1-2 puffs into the lungs every 6 (six) hours as needed for wheezing or shortness of breath.   alendronate (FOSAMAX) 70 MG tablet Take 1 tablet (70 mg total) by mouth once a week.   amLODipine (NORVASC) 10 MG tablet Take 10 mg by mouth daily.   amoxicillin (AMOXIL) 500 MG tablet Take 4 tablets (2,000 mg total) by mouth as directed. 1 hour prior to dental work including cleanings   aspirin 81 MG EC tablet Take 81 mg by mouth daily.     atorvastatin (LIPITOR) 40 MG tablet Take 1 tablet (40 mg total) by mouth daily.   buPROPion (  WELLBUTRIN XL) 300 MG 24 hr tablet 1 po qd   chlorthalidone (HYGROTON) 25 MG tablet Take 1 tablet (25 mg total) by mouth daily.   cholecalciferol (VITAMIN D3) 25 MCG (1000 UNIT) tablet Take 1,000 Units by mouth daily.   citalopram (CELEXA) 20 MG tablet Take 1 tablet (20 mg total) by mouth daily.   EPINEPHrine (EPIPEN) 0.3 mg/0.3 mL DEVI Inject 0.3 mLs (0.3 mg total) into the muscle once.   ezetimibe (ZETIA) 10 MG tablet Take 1 tablet (10 mg total) by mouth daily.   famotidine (PEPCID) 40 MG tablet Take 1 tablet (40 mg total) by mouth at bedtime. (Patient taking differently: Take 40 mg by mouth daily as needed for heartburn or indigestion.)   fluticasone-salmeterol (WIXELA INHUB) 100-50 MCG/ACT AEPB Inhale 1 puff into the lungs 2 (two) times daily.   loratadine (CLARITIN) 10 MG  tablet Take 10 mg by mouth daily.   Multiple Vitamin (MULTIVITAMIN PO) Take 1 tablet by mouth daily.     potassium chloride SA (KLOR-CON M) 20 MEQ tablet Take 1 tablet (20 mEq total) by mouth daily.   valsartan (DIOVAN) 40 MG tablet Take 1 tablet (40 mg total) by mouth daily.      ROS:   Please see the history of present illness.    All other systems reviewed and are negative.  EKGs   EKG Interpretation Date/Time:  Wednesday July 13 2023 13:18:46 EST Ventricular Rate:  76 PR Interval:  186 QRS Duration:  124 QT Interval:  440 QTC Calculation: 495 R Axis:   -10  Text Interpretation: Normal sinus rhythm Minimal voltage criteria for LVH, may be normal variant ( Cornell product ) Non-specific intra-ventricular conduction block Confirmed by Cline Crock [phone]) on 07/13/2023 1:27:14 PM   Risk Assessment/Calculations:           Physical Exam:    VS:  BP 132/72 (BP Location: Right Arm, Patient Position: Sitting, Cuff Size: Normal)   Pulse (!) 48   Resp 16   Ht 5' 3.5" (1.613 m)   Wt 181 lb 6.4 oz (82.3 kg)   SpO2 92%   BMI 31.63 kg/m     Wt Readings from Last 3 Encounters:  07/13/23 181 lb 6.4 oz (82.3 kg)  07/06/23 182 lb 6.4 oz (82.7 kg)  06/20/23 178 lb (80.7 kg)     GEN: Well nourished, well developed in no acute distress NECK: No JVD CARDIAC: RRR, no murmurs, rubs, gallops RESPIRATORY:  Clear to auscultation without rales, wheezing or rhonchi  ABDOMEN: Soft, non-tender, non-distended EXTREMITIES:  No edema; No deformity.  Groin sites clear without hematoma or ecchymosis. Left groin with some mild serosanguanous oozing but no s/s infectgion  ASSESSMENT:    1. S/P TAVR (transcatheter aortic valve replacement)   2. Primary hypertension   3. Hyperlipidemia, unspecified hyperlipidemia type   4. Hypokalemia   5. Pulmonary nodule   6. Solitary pulmonary nodule     PLAN:    In order of problems listed above:  Severe AS s/p TAVR: pt doing well 1 week  out s/p TAVR. ECG with no HAVB. Groin sites healing well, left groin with some mild oozing but no s/s infection. SBE prophylaxis discussed; I have RX'd amoxicillin. Continue Asprin 81mg  daily.  I will see back for 1 month echo and OV.    HTN: BP well controlled. Continue Norvasc 10mg  daily, chlorthalidone 25mg  daily, valsartan 40mg  daily   HLD: continued on Lipitor 40mg  daily and Zetia 10 mg daily.  Hypokalemia: PAT labs showed K of 2.7. Started on Kdur daily. Repeat BMET todau   Pulmonary nodule: pre TAVR CT showed a "new right upper lobe part solid nodule. Follow-up non-contrast CT recommended at 3-6 months to confirm persistence. If unchanged, and solid component remains <6 mm, annual CT is recommended until 5 years of stability has been established. If persistent these nodules should be considered highly suspicious if the solid component of the nodule is 6 mm or greater in size and enlarging. Additional small part solid nodule of the left upper lobe, likely unchanged when compared with 2019 prior. Recommend attention on follow-up." Will get a CT scan up for Feb-March 2025.   Medication Adjustments/Labs and Tests Ordered: Current medicines are reviewed at length with the patient today.  Concerns regarding medicines are outlined above.  Orders Placed This Encounter  Procedures   CT CHEST WO CONTRAST   Basic metabolic panel   EKG 12-Lead   Meds ordered this encounter  Medications   amoxicillin (AMOXIL) 500 MG tablet    Sig: Take 4 tablets (2,000 mg total) by mouth as directed. 1 hour prior to dental work including cleanings    Dispense:  12 tablet    Refill:  12    Order Specific Question:   Supervising Provider    Answer:   Tonny Bollman [[id number]]    Patient Instructions  Medication Instructions:  Your physician has recommended you make the following change in your medication:   **Amoxicillin 500 mg has been added--take 4 tablets (2,000 mg total) 1 hour before dental work  including cleanings.  *If you need a refill on your cardiac medications before your next appointment, please call your pharmacy*  Lab Work: TODAY: BMP If you have labs (blood work) drawn today and your tests are completely normal, you will receive your results only by: MyChart Message (if you have MyChart) OR A paper copy in the mail If you have any lab test that is abnormal or we need to change your treatment, we will call you to review the results.  Testing/Procedures: Your provided has requested you have a chest CT scan performed. Non-Cardiac CT scanning, (CAT scanning), is a noninvasive, special x-ray that produces cross-sectional images of the body using x-rays and a computer. CT scans help physicians diagnose and treat medical conditions. For some CT exams, a contrast material is used to enhance visibility in the area of the body being studied. CT scans provide greater clarity and reveal more details than regular x-ray exams.   Follow-Up: At [facility], you and your health needs are our priority.  As part of our continuing mission to provide you with exceptional heart care, we have created designated Provider Care Teams.  These Care Teams include your primary Cardiologist (physician) and Advanced Practice Providers (APPs -  Physician Assistants and Nurse Practitioners) who all work together to provide you with the care you need, when you need it.  Your next appointment:   1 year(s)  The format for your next appointment:   In Person  Provider:   Carlean Jews, PA-C{   Signed, Cline Crock, PA-C  07/13/2023 2:13 PM    [facility]

## 2023-07-13 ENCOUNTER — Other Ambulatory Visit: Payer: Self-pay | Admitting: Physician Assistant

## 2023-07-13 ENCOUNTER — Ambulatory Visit: Payer: Medicare Other | Attending: Physician Assistant | Admitting: Physician Assistant

## 2023-07-13 VITALS — BP 132/72 | HR 48 | Resp 16 | Ht 63.5 in | Wt 181.4 lb

## 2023-07-13 DIAGNOSIS — R911 Solitary pulmonary nodule: Secondary | ICD-10-CM | POA: Diagnosis present

## 2023-07-13 DIAGNOSIS — E876 Hypokalemia: Secondary | ICD-10-CM | POA: Diagnosis not present

## 2023-07-13 DIAGNOSIS — Z952 Presence of prosthetic heart valve: Secondary | ICD-10-CM

## 2023-07-13 DIAGNOSIS — E785 Hyperlipidemia, unspecified: Secondary | ICD-10-CM | POA: Insufficient documentation

## 2023-07-13 DIAGNOSIS — I10 Essential (primary) hypertension: Secondary | ICD-10-CM | POA: Insufficient documentation

## 2023-07-13 MED ORDER — AMOXICILLIN 500 MG PO TABS
2000.0000 mg | ORAL_TABLET | ORAL | 12 refills | Status: AC
Start: 2023-07-13 — End: ?

## 2023-07-13 NOTE — Patient Instructions (Addendum)
Medication Instructions:  Your physician has recommended you make the following change in your medication:   **Amoxicillin 500 mg has been added--take 4 tablets (2,000 mg total) 1 hour before dental work including cleanings.  *If you need a refill on your cardiac medications before your next appointment, please call your pharmacy*  Lab Work: TODAY: BMP If you have labs (blood work) drawn today and your tests are completely normal, you will receive your results only by: MyChart Message (if you have MyChart) OR A paper copy in the mail If you have any lab test that is abnormal or we need to change your treatment, we will call you to review the results.  Testing/Procedures: Your provided has requested you have a chest CT scan performed. Non-Cardiac CT scanning, (CAT scanning), is a noninvasive, special x-ray that produces cross-sectional images of the body using x-rays and a computer. CT scans help physicians diagnose and treat medical conditions. For some CT exams, a contrast material is used to enhance visibility in the area of the body being studied. CT scans provide greater clarity and reveal more details than regular x-ray exams.   Follow-Up: At [facility], you and your health needs are our priority.  As part of our continuing mission to provide you with exceptional heart care, we have created designated Provider Care Teams.  These Care Teams include your primary Cardiologist (physician) and Advanced Practice Providers (APPs -  Physician Assistants and Nurse Practitioners) who all work together to provide you with the care you need, when you need it.  Your next appointment:   1 year(s)  The format for your next appointment:   In Person  Provider:   Carlean Jews, PA-C{

## 2023-07-14 LAB — BASIC METABOLIC PANEL
BUN/Creatinine Ratio: 16 (ref 12–28)
BUN: 17 mg/dL (ref 8–27)
CO2: 23 mmol/L (ref 20–29)
Calcium: 9.4 mg/dL (ref 8.7–10.3)
Chloride: 94 mmol/L — ABNORMAL LOW (ref 96–106)
Creatinine, Ser: 1.05 mg/dL — ABNORMAL HIGH (ref 0.57–1.00)
Glucose: 101 mg/dL — ABNORMAL HIGH (ref 70–99)
Potassium: 3.5 mmol/L (ref 3.5–5.2)
Sodium: 135 mmol/L (ref 134–144)
eGFR: 55 mL/min/{1.73_m2} — ABNORMAL LOW (ref >59)

## 2023-07-18 ENCOUNTER — Telehealth (HOSPITAL_COMMUNITY): Payer: Self-pay

## 2023-07-18 NOTE — Telephone Encounter (Signed)
Per phase 1 Cardiac Rehab fax referral to [facility].

## 2023-08-25 ENCOUNTER — Ambulatory Visit (HOSPITAL_COMMUNITY): Payer: Medicare Other | Attending: Internal Medicine | Admitting: Internal Medicine

## 2023-08-25 ENCOUNTER — Other Ambulatory Visit: Payer: BLUE CROSS/BLUE SHIELD

## 2023-08-25 ENCOUNTER — Ambulatory Visit (HOSPITAL_BASED_OUTPATIENT_CLINIC_OR_DEPARTMENT_OTHER): Payer: Medicare Other

## 2023-08-25 VITALS — BP 145/88 | HR 83 | Ht 63.5 in | Wt 184.0 lb

## 2023-08-25 DIAGNOSIS — I6523 Occlusion and stenosis of bilateral carotid arteries: Secondary | ICD-10-CM

## 2023-08-25 DIAGNOSIS — Z952 Presence of prosthetic heart valve: Secondary | ICD-10-CM | POA: Diagnosis present

## 2023-08-25 DIAGNOSIS — I340 Nonrheumatic mitral (valve) insufficiency: Secondary | ICD-10-CM | POA: Diagnosis not present

## 2023-08-25 DIAGNOSIS — E7800 Pure hypercholesterolemia, unspecified: Secondary | ICD-10-CM

## 2023-08-25 DIAGNOSIS — I10 Essential (primary) hypertension: Secondary | ICD-10-CM

## 2023-08-25 DIAGNOSIS — I700 Atherosclerosis of aorta: Secondary | ICD-10-CM

## 2023-08-25 LAB — ECHOCARDIOGRAM COMPLETE
AR max vel: 1.66 cm2
AV Area VTI: 1.66 cm2
AV Area mean vel: 1.63 cm2
AV Mean grad: 17.4 mm[Hg]
AV Peak grad: 31.7 mm[Hg]
Ao pk vel: 2.82 m/s
Area-P 1/2: 3.38 cm2
S' Lateral: 1.3 cm

## 2023-08-25 NOTE — Patient Instructions (Signed)
Medication Instructions:  Your physician recommends that you continue on your current medications as directed. Please refer to the Current Medication list given to you today.  *If you need a refill on your cardiac medications before your next appointment, please call your pharmacy*   Lab Work: NONE  If you have labs (blood work) drawn today and your tests are completely normal, you will receive your results only by: MyChart Message (if you have MyChart) OR A paper copy in the mail If you have any lab test that is abnormal or we need to change your treatment, we will call you to review the results.   Testing/Procedures: OCT 2025- - - Your physician has requested that you have a carotid duplex. This test is an ultrasound of the carotid arteries in your neck. It looks at blood flow through these arteries that supply the brain with blood. Allow one hour for this exam. There are no restrictions or special instructions.    Follow-Up: At [facility], you and your health needs are our priority.  As part of our continuing mission to provide you with exceptional heart care, we have created designated Provider Care Teams.  These Care Teams include your primary Cardiologist (physician) and Advanced Practice Providers (APPs -  Physician Assistants and Nurse Practitioners) who all work together to provide you with the care you need, when you need it.  Your next appointment:   7 month(s)  Provider:   Christell Constant, MD

## 2023-08-25 NOTE — Progress Notes (Addendum)
Cardiology Office Note:  .    Date:  08/25/2023  ID:  [name], DOB April 03, 1946, MRN [id number] PCP: Gerre Scull, NP  [facility] Providers Cardiologist:  Christell Constant, MD     CC: TAVR f/u  History of Present Illness: .    Samantha Davidson is a 78 y.o. female with a history of bilateral carotid artery stenosis, moderate aortic stenosis (2022) , HTN and HLD who presents for evaluation after previously seeing an [facility] facility in [place] (Dr. Gerome Apley). 2024: Found to have Severe AS, CA Duplex- moderate bilateral disease, and symptoms.  S/p 23 mm Sapien. She is on atorvastatin 40 mg due to hair loss at the 80 mg dose.  Discussed the use of AI scribe software for clinical note transcription with the patient, who gave verbal consent to proceed.  Samantha Davidson, a 78 year old female, established care with a diagnosis of severe aortic stenosis. She underwent a successful Transcatheter Aortic Valve Replacement (TAVR) procedure, where a 23 millimeter Sapien valve was placed. Post-procedure, she reported feeling much better, with only occasional bendopnea. She also has mild mitral stenosis (MS) and moderate mitral annular calcification (MAC).  She has been managing her hypertension and is due for a follow-up on her carotid disease. She also has mild tricuspid regurgitation, mild mitral regurgitation, and mild pulmonic regurgitation. Despite these conditions, she remains asymptomatic and is able to carry out her daily activities without any falls or shortness of breath.  She had been experiencing dizzy spells, which were particularly severe prior to her right eye surgery. This resolved after TAVR. However, she reports feeling well-compensated and in good health overall.  She resides in [place] and travels for her care. Her blood pressure was noted to be elevated during the visit, which she attributed to not taking her medication on the day due to her early  travel.  Relevant histories: .  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   Physical Exam:    VS:  BP (!) 145/88 (BP Location: Left Arm)   Pulse 83   Ht 5' 3.5" (1.613 m)   Wt 184 lb (83.5 kg)   SpO2 98%   BMI 32.08 kg/m    Wt Readings from Last 3 Encounters:  08/25/23 184 lb (83.5 kg)  07/13/23 181 lb 6.4 oz (82.3 kg)  07/06/23 182 lb 6.4 oz (82.7 kg)    Gen: no distress  Neck: No JVD, bilateral carotid bruit Cardiac: No Rubs or Gallops, residual systolic and diastolic murmurs, RRR +1 radial pulses Respiratory: inspiratory wheezes bilaterally, normal effort, normal  respiratory rate GI: Soft, nontender, non-distended  MS: No  edema;  moves all extremities Integument: Skin feels warm Neuro:  At time of evaluation, alert and oriented to person/place/time/situation  Psych: Normal affect, patient feels well  ASSESSMENT AND PLAN: .    Severe Aortic Stenosis, status post TAVR 78 year old with severe aortic stenosis underwent TAVR with a 23 mm Sapien valve. Currently asymptomatic. Echocardiogram shows a mean gradient of 19 mmHg, DVI of 0.46, and an effective orifice area of 1.4 cm, indicating patient-prosthesis mismatch. No significant paravalvular leak. AT is normal not suggestive of prosthetic valve thrombus/pannus - has structural f/u - on ASA - has echo yearly f/u planned - has PRN Abx for dental procedures - reasonable to get her eye surgery - needs KCCQ  today - NYHA class II sx.  Multivalve Disease Mild mitral regurgitation, Mild MS, mild tricuspid regurgitation, and mild pulmonic regurgitation. Asymptomatic. 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 - Order yearly echocardiogram, no change to therapy at this time,   Moderate Carotid Artery Stenosis HLD Moderate carotid artery stenosis. No acute symptoms. - Order carotid artery duplex in October 2025 -  continue current therapy for HLD  Hypertension Elevated blood pressure today due to missed morning medication. - Continue current antihypertensive regimen  General Health Maintenance Requires antibiotics before dental procedures due to prosthetic valve. No antibiotic allergies. - Prescribe amoxicillin before dental procedures  Follow-up - Schedule follow-up appointment in late summer 2025.  [name], MD [facility] Cardiologist [facility]  [address] [place], [address] [place], [place] [address] [phone]  11:31 AM

## 2023-08-26 LAB — LIPID PANEL
Chol/HDL Ratio: 2.3 {ratio} (ref 0.0–4.4)
Cholesterol, Total: 270 mg/dL — ABNORMAL HIGH (ref 100–199)
HDL: 115 mg/dL (ref >39)
LDL Chol Calc (NIH): 135 mg/dL — ABNORMAL HIGH (ref 0–99)
Triglycerides: 118 mg/dL (ref 0–149)
VLDL Cholesterol Cal: 20 mg/dL (ref 5–40)

## 2023-08-26 LAB — ALT: ALT: 17 [IU]/L (ref 0–32)

## 2023-08-29 ENCOUNTER — Telehealth: Payer: Self-pay

## 2023-08-29 DIAGNOSIS — E785 Hyperlipidemia, unspecified: Secondary | ICD-10-CM

## 2023-08-29 DIAGNOSIS — I6523 Occlusion and stenosis of bilateral carotid arteries: Secondary | ICD-10-CM

## 2023-08-29 MED ORDER — ROSUVASTATIN CALCIUM 40 MG PO TABS: 40.0000 mg | ORAL_TABLET | Freq: Every day | ORAL | 3 refills | Status: DC

## 2023-08-29 NOTE — Telephone Encounter (Signed)
The patient has been notified of the result and verbalized understanding.  All questions (if any) were answered. Samantha Right Karry Barrilleaux, RN 08/29/2023 1:59 PM   Pt reports hair loss on 80 mg.  Pt will start rosuvastatin 40 mg PO every day.  FLP, ALT in 3 months labs placed and released for future draw.   Pt reports she has ST memory issues and did not answer questions regarding SOB correctly at OV.  Reports SOB with stairs and walking to mailbox.  Pt reports mailbox is on a hill.  Denies SOB while walking in the grocery store or doing normal daily living task. Pt wants to know what to do about SOB. Denies swelling.  Advised pt will send concern to MD to address.

## 2023-08-29 NOTE — Telephone Encounter (Signed)
-----   Message from Christell Constant sent at 08/27/2023  8:47 PM EST ----- Regarding: FW: Her LDL is above our goal for her right arteries stenosis. If she has had no issues with atorvastatin 80 mg in the past, I would increase to this otherwise I would change to receive statin 40 mg. She can get follow Up labs in three months in [place]. ----- Message ----- From: Interface, [facility] Lab Results In Sent: 08/26/2023   2:36 AM EST To: Christell Constant, MD

## 2023-08-31 MED ORDER — CARVEDILOL 3.125 MG PO TABS: 3.1250 mg | ORAL_TABLET | Freq: Two times a day (BID) | ORAL | 3 refills | Status: DC

## 2023-08-31 NOTE — Telephone Encounter (Signed)
Called pt advised of MD response:  Patient calls to note: Pt reports she has ST memory issues and did not answer questions regarding SOB correctly at OV.  Reports SOB with stairs and walking to mailbox.  Pt reports mailbox is on a hill.  Denies SOB while walking in the grocery store or doing normal daily living task. Denies swelling.   - Difficult to say given this is markedly different than our office visit discussion (KCCQ may not be accurate) - NYHA II dyspnea is suggested - Echo now with mild multiple valve disease; BP elevated but she notes this was only because she didn't take her meds prior to the drive down here - trial of coreg 3.125 mg PO BID (treat Mild MS and HTN) - Cardiac rehab referral  If no improvement would need sooner f/u   Name and instructions for Coreg spelled out for pt as she reports ST memory issues.  Pt reports would love to do Cardiac Rehab but lives in [place], [place].  Advised pt I will send message to MD to see if he has a program that he prefers.

## 2023-09-02 NOTE — Telephone Encounter (Signed)
Called [facility] was advised to send referral to fax number [phone]. Referral placed and faxed via email.  Confirmation:-Fax Transmission Report-------  To:               Recipient at [phone] Subject:          Cardiac Rehab Referral Result:           The transmission was successful. Explanation:      All Pages Ok Pages Sent:       3 Connect Time:     0 minutes, 58 seconds Transmit Time:    09/02/2023 13:27  Called pt advised of referral.  Pt reports facility is 45 min from home; our office is 2 hours.  Pt had no further questions or concerns.

## 2023-09-05 ENCOUNTER — Encounter (HOSPITAL_BASED_OUTPATIENT_CLINIC_OR_DEPARTMENT_OTHER): Payer: Self-pay

## 2023-09-09 ENCOUNTER — Encounter (HOSPITAL_BASED_OUTPATIENT_CLINIC_OR_DEPARTMENT_OTHER): Payer: Self-pay

## 2023-09-19 ENCOUNTER — Ambulatory Visit (INDEPENDENT_AMBULATORY_CARE_PROVIDER_SITE_OTHER): Payer: Medicare Other

## 2023-09-19 DIAGNOSIS — Z0000 Encounter for general adult medical examination without abnormal findings: Secondary | ICD-10-CM

## 2023-09-19 NOTE — Patient Instructions (Signed)
 [name] , Thank you for taking time to come for your Medicare Wellness Visit. I appreciate your ongoing commitment to your health goals. Please review the following plan we discussed and let me know if I can assist you in the future.   Referrals/Orders/Follow-Ups/Clinician Recommendations: none  This is a list of the screening recommended for you and due dates:  Health Maintenance  Topic Date Due   Zoster (Shingles) Vaccine (1 of 2) Never done   Colon Cancer Screening  02/22/2013   Flu Shot  03/10/2023   COVID-19 Vaccine (1 - 2024-25 season) Never done   Medicare Annual Wellness Visit  09/18/2024   DTaP/Tdap/Td vaccine (2 - Td or Tdap) 12/22/2024   Pneumonia Vaccine  Completed   DEXA scan (bone density measurement)  Completed   Hepatitis C Screening  Completed   HPV Vaccine  Aged Out    Advanced directives: (Copy Requested) Please bring a copy of your health care power of attorney and living will to the office to be added to your chart at your convenience.  Next Medicare Annual Wellness Visit scheduled for next year: Yes  insert Preventive Care attachment Insert FALL PREVENTION attachment if needed

## 2023-09-19 NOTE — Progress Notes (Signed)
 Subjective:   Samantha Davidson is a 78 y.o. female who presents for Medicare Annual (Subsequent) preventive examination.  Visit Complete: Virtual I connected with  Kasandra Pain on 09/19/23 by a audio enabled telemedicine application and verified that I am speaking with the correct person using two identifiers.  Interactive audio and video telecommunications were attempted between this provider and patient, however failed, due to patient having technical difficulties OR patient did not have access to video capability.  We continued and completed visit with audio only.  Patient Location: Home  Provider Location: Office/Clinic  I discussed the limitations of evaluation and management by telemedicine. The patient expressed understanding and agreed to proceed.  Vital Signs: Because this visit was a virtual/telehealth visit, some criteria may be missing or patient reported. Any vitals not documented were not able to be obtained and vitals that have been documented are patient reported.    Cardiac Risk Factors include: advanced age (>13men, >93 women);hypertension     Objective:    Today's Vitals   There is no height or weight on file to calculate BMI.     09/19/2023    1:10 PM 06/16/2023   10:25 AM 09/06/2022    1:14 PM 04/06/2016    3:55 PM 07/22/2014   10:23 PM  Advanced Directives  Does Patient Have a Medical Advance Directive? Yes Yes Yes Yes Yes  Type of Estate agent of [place];Living will Healthcare Power of [place];Living will Healthcare Power of [place];Living will Healthcare Power of [place];Living will Healthcare Power of Attorney  Does patient want to make changes to medical advance directive?  No - Patient declined  No - Patient declined   Copy of Healthcare Power of Attorney in Chart? No - copy requested No - copy requested No - copy requested No - copy requested     Current Medications (verified) Outpatient Encounter  Medications as of 09/19/2023  Medication Sig   albuterol  (VENTOLIN  HFA) 108 (90 Base) MCG/ACT inhaler Inhale 1-2 puffs into the lungs every 6 (six) hours as needed for wheezing or shortness of breath.   alendronate  (FOSAMAX ) 70 MG tablet Take 1 tablet (70 mg total) by mouth once a week.   amLODipine  (NORVASC ) 10 MG tablet Take 10 mg by mouth daily.   amoxicillin  (AMOXIL ) 500 MG tablet Take 4 tablets (2,000 mg total) by mouth as directed. 1 hour prior to dental work including cleanings   aspirin  81 MG EC tablet Take 81 mg by mouth daily.     buPROPion  (WELLBUTRIN  XL) 300 MG 24 hr tablet 1 po qd   carvedilol  (COREG ) 3.125 MG tablet Take 1 tablet (3.125 mg total) by mouth 2 (two) times daily.   chlorthalidone  (HYGROTON ) 25 MG tablet Take 1 tablet (25 mg total) by mouth daily.   cholecalciferol (VITAMIN D3) 25 MCG (1000 UNIT) tablet Take 1,000 Units by mouth daily.   citalopram  (CELEXA ) 20 MG tablet Take 1 tablet (20 mg total) by mouth daily.   EPINEPHrine  (EPIPEN ) 0.3 mg/0.3 mL DEVI Inject 0.3 mLs (0.3 mg total) into the muscle once.   ezetimibe  (ZETIA ) 10 MG tablet Take 1 tablet (10 mg total) by mouth daily.   famotidine  (PEPCID ) 40 MG tablet Take 1 tablet (40 mg total) by mouth at bedtime. (Patient taking differently: Take 40 mg by mouth daily as needed for heartburn or indigestion.)   fexofenadine  (ALLEGRA ) 180 MG tablet Take by mouth as needed for allergies.   fluticasone -salmeterol (WIXELA INHUB) 100-50 MCG/ACT AEPB Inhale 1 puff into  the lungs 2 (two) times daily.   loratadine  (CLARITIN ) 10 MG tablet Take 10 mg by mouth daily.   Multiple Vitamin (MULTIVITAMIN PO) Take 1 tablet by mouth daily.     potassium chloride  SA (KLOR-CON  M) 20 MEQ tablet Take 1 tablet (20 mEq total) by mouth daily.   rosuvastatin  (CRESTOR ) 40 MG tablet Take 1 tablet (40 mg total) by mouth daily.   valsartan  (DIOVAN ) 40 MG tablet Take 1 tablet (40 mg total) by mouth daily.   No facility-administered encounter  medications on file as of 09/19/2023.    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 Albertha Alosa & Dr. Sherene Dilling   Severe aortic stenosis    Tubular adenoma of colon    Past Surgical History:  Procedure Laterality Date   COLONOSCOPY     FOOT SURGERY  08/09/1986   pin in both great toes   HAND SURGERY Left    INTRAOPERATIVE TRANSTHORACIC ECHOCARDIOGRAM N/A 07/05/2023   Procedure: INTRAOPERATIVE TRANSTHORACIC ECHOCARDIOGRAM;  Surgeon: Odie Benne, MD;  Location: [facility];  Service: Cardiovascular;  Laterality: N/A;   RIGHT HEART CATH AND CORONARY ANGIOGRAPHY N/A 06/16/2023   Procedure: RIGHT HEART CATH AND CORONARY ANGIOGRAPHY;  Surgeon: Odie Benne, MD;  Location: [facility];  Service: Cardiovascular;  Laterality: N/A;   Family History  Problem Relation Age of Onset   Diabetes Mother    Lung cancer Mother    Heart disease Mother    Mental illness Mother    Alzheimer's disease Mother    Cancer Father    Arthritis Maternal Grandmother    Social History   Socioeconomic History   Marital status: Single    Spouse name: Not on file   Number of children: 2   Years of education: Not on file   Highest education level: Bachelor's degree (e.g., BA, AB, BS)  Occupational History   Occupation: Comptroller: [employer]    Employer: [employer]  Tobacco Use   Smoking status: Former   Smokeless tobacco: Never   Tobacco comments:    only when stressed  Vaping Use   Vaping status: Never Used  Substance and Sexual Activity   Alcohol use: Not Currently     Comment: wine on occasion   Drug use: No   Sexual activity: Not Currently  Other Topics Concern   Not on file  Social History Narrative   Not on file   Social Drivers of Health   Financial Resource Strain: Low Risk  (09/19/2023)   Overall Financial Resource Strain (CARDIA)    Difficulty of Paying Living Expenses: Not hard at all  Food Insecurity: No Food Insecurity (09/19/2023)   Hunger Vital Sign    Worried About Running Out of Food in the Last Year: Never true    Ran Out of Food in the Last Year: Never true  Transportation Needs: No Transportation Needs (09/19/2023)   PRAPARE - Administrator, Civil Service (Medical): No    Lack of Transportation (Non-Medical): No  Physical Activity: Inactive (09/19/2023)   Exercise Vital Sign    Days of Exercise per Week: 0 days    Minutes of Exercise per Session: 0 min  Stress: No Stress Concern Present (02/14/2023)   [employer] of Occupational Health - Occupational Stress Questionnaire    Feeling of Stress : Not at all  Social Connections: Socially Isolated (09/19/2023)   Social Connection and Isolation Panel [NHANES]    Frequency of Communication with Friends and Family: More than three times a week    Frequency of Social Gatherings with Friends and Family: Not on file    Attends Religious Services: Never    Active Member of Clubs or Organizations: No    Attends Banker Meetings: Never    Marital Status: Widowed    Tobacco Counseling Counseling given: Not Answered Tobacco comments: only when stressed   Clinical Intake:  Pre-visit preparation completed: Yes  Pain : No/denies pain     Nutritional Risks: None Diabetes: No  How often do you need to have someone help you when you read instructions, pamphlets, or other written materials from your doctor or pharmacy?: 1 - Never  Interpreter Needed?: No  Information entered by :: NAllen LPN   Activities of Daily Living    09/19/2023    1:03 PM  07/05/2023    6:36 AM  In your present state of health, do you have any difficulty performing the following activities:  Hearing? 0   Vision? 0   Difficulty concentrating or making decisions? 1   Comment had a concussion, short term memory   Walking or climbing stairs? 0   Dressing or bathing? 0   Doing errands, shopping? 0 0  Preparing Food and eating ? N   Using the Toilet? N   In the past six months, have you accidently leaked urine? Y   Do you have problems with loss of bowel control? N   Managing your Medications? N   Managing your Finances? N   Housekeeping or managing your Housekeeping? N     Patient Care Team: Odette Benjamin, NP as PCP - General (Internal Medicine) Jann Melody, MD as PCP - Cardiology (Cardiology) Kenney Peacemaker, MD as Consulting Physician (Gastroenterology) Corie Diamond, MD as Consulting Physician (Ophthalmology)  Indicate any recent Medical Services you may have received from other than [facility] providers in the past year (date may be approximate).     Assessment:   This is a routine wellness examination for Samantha Davidson.  Hearing/Vision screen Hearing Screening - Comments:: Denies hearing issues Vision Screening - Comments:: Regular eye exams, [facility]   Goals Addressed             This Visit's Progress    Patient Stated       09/19/2023, start exercising regularly       Depression Screen    09/19/2023    1:12 PM 02/17/2023    2:54 PM 09/06/2022    1:17 PM 05/21/2022    3:57 PM 04/06/2016    3:35 PM 04/01/2015    1:29 PM 09/21/2013    9:09 AM  PHQ 2/9 Scores  PHQ - 2 Score 0 0 0 2 0 0 0  PHQ- 9 Score 0 4  12       Fall Risk    09/19/2023    1:11 PM 02/17/2023    2:32 PM 09/06/2022    1:15 PM 05/21/2022    3:15 PM 06/27/2018    9:45 AM  Fall Risk   Falls in the past year? 0 1 1 1  0  Comment     Emmi Telephone Survey: data to providers prior to load  Number falls in past yr: 0 0 0    Injury with Fall? 0 1 1     Comment   Neck FX    Risk for fall due to : Medication side effect History of fall(s) History of fall(s);Medication side effect    Follow up Falls prevention discussed;Falls evaluation completed Falls evaluation completed Falls prevention discussed      MEDICARE RISK AT HOME: Medicare Risk at Home Any stairs in or around the home?: Yes If so, are there any without handrails?: No Home free of loose throw rugs in walkways, pet beds, electrical cords, etc?: Yes Adequate lighting in your home to reduce risk of falls?: Yes Life alert?: No Use of a cane, walker or w/c?: No Grab bars in the bathroom?: Yes Shower chair or bench in shower?: Yes Elevated toilet seat or a handicapped toilet?: Yes  TIMED UP AND GO:  Was the test performed?  No    Cognitive Function:    04/06/2016    3:57 PM  MMSE - Mini Mental State Exam  Orientation to time 5  Orientation to Place 5  Registration 3  Attention/ Calculation 5  Recall 3  Language- name 2 objects 2  Language- repeat 1  Language- follow 3 step command 3  Language- read & follow direction 1  Write a sentence 1  Copy design 1  Total score 30        09/19/2023    1:15 PM 09/06/2022    1:22 PM  6CIT Screen  What Year? 0 points 0 points  What month? 0 points 0 points  What time? 0 points 0 points  Count back from 20 0 points 0 points  Months in reverse 0 points 0 points  Repeat phrase 0 points 2 points  Total Score 0 points 2 points    Immunizations Immunization History  Administered Date(s) Administered   Fluad Quad(high Dose 65+) 05/21/2022   Influenza Split 06/07/2012   Influenza, High Dose Seasonal PF 06/02/2017   Influenza,inj,Quad PF,6+ Mos 09/21/2013   Influenza-Unspecified 06/02/2017, 06/09/2018   Pneumococcal Conjugate-13 04/06/2016   Pneumococcal Polysaccharide-23 06/07/2012, 01/30/2019   Tdap 12/23/2014    TDAP status: Up to date  Flu Vaccine status: Due, Education has been provided regarding the importance  of this vaccine. Advised may receive this vaccine at local pharmacy or Health Dept. Aware to provide a copy of the vaccination record if obtained from local pharmacy or Health Dept. Verbalized acceptance and understanding.  Pneumococcal vaccine status: Up to date  Covid-19 vaccine status: Information provided on how to obtain vaccines.   Qualifies for Shingles Vaccine? Yes   Zostavax completed No   Shingrix Completed?: No.    Education has been provided regarding the importance of this vaccine. Patient has been advised to call insurance company to determine out of pocket expense if they have not yet received this vaccine. Advised may also receive vaccine at local pharmacy or Health Dept. Verbalized acceptance and understanding.  Screening Tests Health Maintenance  Topic Date Due   Zoster Vaccines- Shingrix (1 of 2) Never done   Colonoscopy  02/22/2013   INFLUENZA VACCINE  03/10/2023   COVID-19 Vaccine (1 - 2024-25 season) Never done   Medicare Annual Wellness (AWV)  09/18/2024   DTaP/Tdap/Td (2 - Td or Tdap) 12/22/2024   Pneumonia Vaccine  44+ Years old  Completed   DEXA SCAN  Completed   Hepatitis C Screening  Completed   HPV VACCINES  Aged Out    Health Maintenance  Health Maintenance Due  Topic Date Due   Zoster Vaccines- Shingrix (1 of 2) Never done   Colonoscopy  02/22/2013   INFLUENZA VACCINE  03/10/2023   COVID-19 Vaccine (1 - 2024-25 season) Never done    Colorectal cancer screening: No longer required.   Mammogram status: No longer required due to age.  Bone Density status: Completed 11/16/2021.   Lung Cancer Screening: (Low Dose CT Chest recommended if Age 50-80 years, 20 pack-year currently smoking OR have quit w/in 15years.) does not qualify.   Lung Cancer Screening Referral: none  Additional Screening:  Hepatitis C Screening: does qualify; Completed 02/17/2023  Vision Screening: Recommended annual ophthalmology exams for early detection of glaucoma and other  disorders of the eye. Is the patient up to date with their annual eye exam?  Yes  Who is the provider or what is the name of the office in which the patient attends annual eye exams? [facility] If pt is not established with a provider, would they like to be referred to a provider to establish care? No .   Dental Screening: Recommended annual dental exams for proper oral hygiene  Diabetic Foot Exam: n/a  Community Resource Referral / Chronic Care Management: CRR required this visit?  No   CCM required this visit?  No     Plan:     I have personally reviewed and noted the following in the patient's chart:   Medical and social history Use of alcohol, tobacco or illicit drugs  Current medications and supplements including opioid prescriptions. Patient is not currently taking opioid prescriptions. Functional ability and status Nutritional status Physical activity Advanced directives List of other physicians Hospitalizations, surgeries, and ER visits in previous 12 months Vitals Screenings to include cognitive, depression, and falls Referrals and appointments  In addition, I have reviewed and discussed with patient certain preventive protocols, quality metrics, and best practice recommendations. A written personalized care plan for preventive services as well as general preventive health recommendations were provided to patient.     Areatha Beecham, LPN   1/61/0960   After Visit Summary: (MyChart) Due to this being a telephonic visit, the after visit summary with patients personalized plan was offered to patient via MyChart   Nurse Notes: none

## 2023-10-10 ENCOUNTER — Ambulatory Visit (HOSPITAL_COMMUNITY): Payer: Medicare Other

## 2023-10-13 ENCOUNTER — Ambulatory Visit (HOSPITAL_COMMUNITY): Payer: Medicare Other | Attending: Physician Assistant

## 2023-10-27 ENCOUNTER — Ambulatory Visit (HOSPITAL_COMMUNITY)
Admission: RE | Admit: 2023-10-27 | Discharge: 2023-10-27 | Disposition: A | Discharge status: Home / Self Care | Source: Ambulatory Visit | Attending: Physician Assistant | Admitting: Physician Assistant

## 2023-10-27 DIAGNOSIS — R911 Solitary pulmonary nodule: Secondary | ICD-10-CM | POA: Diagnosis present

## 2023-11-09 ENCOUNTER — Telehealth: Payer: Self-pay | Admitting: Internal Medicine

## 2023-11-09 ENCOUNTER — Ambulatory Visit: Attending: Internal Medicine

## 2023-11-09 DIAGNOSIS — Z952 Presence of prosthetic heart valve: Secondary | ICD-10-CM

## 2023-11-09 DIAGNOSIS — R42 Dizziness and giddiness: Secondary | ICD-10-CM

## 2023-11-09 NOTE — Telephone Encounter (Signed)
 STAT if patient feels like he/she is going to faint   1. Are you feeling dizzy, lightheaded, or faint right now?   No  2. Have you passed out?  No (If yes move to .SYNCOPECHMG)  3. Do you have any other symptoms?   SOB - patient noted she has asthma   4. Have you checked your HR and BP (record if available)?        BP 133/40 (after exercise)    Patient stated she has been having occasional dizziness.  Patient noted the dizziness has been occurring when she turns her neck or looks up. Patient noted she has back issues and is concerned this may be related to circulation in her neck.

## 2023-11-09 NOTE — Telephone Encounter (Signed)
 This occurred after turning her head, more suggestive of a non cardiac cause.  Has hx of TAVR.  Would send non-live heart monitor to evaluate risk of HB though this seems atypical.  If normal monitor, would send to her PCP so non-cardiac causes are explored.   [name], MD FASE [facility]  Cardiologist  [facility]  [address] [place], [address]  [place], [place] [address]  [phone]  2:37 PM   Patient notified.  She is aware monitor will be mailed to her.

## 2023-11-09 NOTE — Progress Notes (Unsigned)
 Enrolled patient for a 14 day Zio XT  monitor to be mailed to patients home

## 2023-11-09 NOTE — Telephone Encounter (Signed)
 I spoke with patient.  She reports dizziness which started about 2 weeks ago.  Occurs when looking up or turning her head to the right.  Dizziness lasts for a couple of seconds.  One time she "saw black" and thought she might pass out.   She reports history of back break which required her to wear a neck collar but no surgery was needed. Has bad allergies this time of year.  No other sickness. Goes to cardiac rehab 3 times per week.  BP readings are occasionally elevated at rehab but improve after rest.

## 2023-11-14 ENCOUNTER — Telehealth: Payer: Self-pay

## 2023-11-14 NOTE — Telephone Encounter (Signed)
 Spoke with patient and she is aware of CT results and provider recommendations

## 2023-11-14 NOTE — Telephone Encounter (Signed)
-----   Message from Cline Crock sent at 11/13/2023 11:28 AM EDT ----- pulmonary nodule previously noted has completely resolved. There is another nodule that does not require follow up unless high risk (history of cancer or long history of tobacco abuse). If she does have a history of either, she will need to have it followed in a year by PCP. If not, no further imaging necessary.

## 2024-01-12 ENCOUNTER — Other Ambulatory Visit (HOSPITAL_COMMUNITY): Payer: Self-pay | Admitting: Student

## 2024-01-12 DIAGNOSIS — I6523 Occlusion and stenosis of bilateral carotid arteries: Secondary | ICD-10-CM

## 2024-01-12 DIAGNOSIS — R42 Dizziness and giddiness: Secondary | ICD-10-CM

## 2024-01-14 ENCOUNTER — Ambulatory Visit (HOSPITAL_COMMUNITY)
Admission: RE | Admit: 2024-01-14 | Discharge: 2024-01-14 | Disposition: A | Discharge status: Home / Self Care | Source: Ambulatory Visit | Attending: Student | Admitting: Student

## 2024-01-14 DIAGNOSIS — I6523 Occlusion and stenosis of bilateral carotid arteries: Secondary | ICD-10-CM | POA: Diagnosis present

## 2024-01-14 DIAGNOSIS — R42 Dizziness and giddiness: Secondary | ICD-10-CM | POA: Diagnosis present

## 2024-01-14 MED ORDER — GADOBUTROL 1 MMOL/ML IV SOLN
8.0000 mL | Freq: Once | INTRAVENOUS | Status: AC | PRN
  Administered 2024-01-14: 8 mL via INTRAVENOUS

## 2024-02-02 ENCOUNTER — Other Ambulatory Visit: Payer: Self-pay | Admitting: Nurse Practitioner

## 2024-02-03 NOTE — Telephone Encounter (Signed)
 Requesting: VALSARTAN  40 MG TABLET  Last Visit: 02/17/2023 Next Visit: 02/20/2024 Last Refill: 02/17/2023  Please Advise

## 2024-02-20 ENCOUNTER — Ambulatory Visit (INDEPENDENT_AMBULATORY_CARE_PROVIDER_SITE_OTHER): Payer: Medicare Other | Admitting: Nurse Practitioner

## 2024-02-20 ENCOUNTER — Encounter: Payer: Self-pay | Admitting: Nurse Practitioner

## 2024-02-20 VITALS — BP 138/80 | HR 91 | Temp 97.0°F | Ht 63.5 in | Wt 186.4 lb

## 2024-02-20 DIAGNOSIS — Z952 Presence of prosthetic heart valve: Secondary | ICD-10-CM

## 2024-02-20 DIAGNOSIS — M810 Age-related osteoporosis without current pathological fracture: Secondary | ICD-10-CM

## 2024-02-20 DIAGNOSIS — R42 Dizziness and giddiness: Secondary | ICD-10-CM

## 2024-02-20 DIAGNOSIS — E7800 Pure hypercholesterolemia, unspecified: Secondary | ICD-10-CM

## 2024-02-20 DIAGNOSIS — N393 Stress incontinence (female) (male): Secondary | ICD-10-CM | POA: Insufficient documentation

## 2024-02-20 DIAGNOSIS — I10 Essential (primary) hypertension: Secondary | ICD-10-CM | POA: Diagnosis not present

## 2024-02-20 DIAGNOSIS — J4540 Moderate persistent asthma, uncomplicated: Secondary | ICD-10-CM | POA: Diagnosis not present

## 2024-02-20 DIAGNOSIS — E041 Nontoxic single thyroid nodule: Secondary | ICD-10-CM | POA: Diagnosis not present

## 2024-02-20 LAB — CBC WITH DIFFERENTIAL/PLATELET
Basophils Absolute: 0 K/uL (ref 0.0–0.1)
Basophils Relative: 0.5 % (ref 0.0–3.0)
Eosinophils Absolute: 0.2 K/uL (ref 0.0–0.7)
Eosinophils Relative: 2.1 % (ref 0.0–5.0)
HCT: 37.5 % (ref 36.0–46.0)
Hemoglobin: 13 g/dL (ref 12.0–15.0)
Lymphocytes Relative: 28.1 % (ref 12.0–46.0)
Lymphs Abs: 2.7 K/uL (ref 0.7–4.0)
MCHC: 34.6 g/dL (ref 30.0–36.0)
MCV: 97.4 fl (ref 78.0–100.0)
Monocytes Absolute: 0.6 K/uL (ref 0.1–1.0)
Monocytes Relative: 5.9 % (ref 3.0–12.0)
Neutro Abs: 6.1 K/uL (ref 1.4–7.7)
Neutrophils Relative %: 63.4 % (ref 43.0–77.0)
Platelets: 212 K/uL (ref 150.0–400.0)
RBC: 3.85 Mil/uL — ABNORMAL LOW (ref 3.87–5.11)
RDW: 12.8 % (ref 11.5–15.5)
WBC: 9.6 K/uL (ref 4.0–10.5)

## 2024-02-20 LAB — LIPID PANEL
Cholesterol: 211 mg/dL — ABNORMAL HIGH (ref 0–200)
HDL: 94 mg/dL (ref >39.00)
LDL Cholesterol: 69 mg/dL (ref 0–99)
NonHDL: 117.48
Total CHOL/HDL Ratio: 2
Triglycerides: 244 mg/dL — ABNORMAL HIGH (ref 0.0–149.0)
VLDL: 48.8 mg/dL — ABNORMAL HIGH (ref 0.0–40.0)

## 2024-02-20 LAB — COMPREHENSIVE METABOLIC PANEL WITH GFR
ALT: 19 U/L (ref 0–35)
AST: 21 U/L (ref 0–37)
Albumin: 4.3 g/dL (ref 3.5–5.2)
Alkaline Phosphatase: 71 U/L (ref 39–117)
BUN: 18 mg/dL (ref 6–23)
CO2: 31 meq/L (ref 19–32)
Calcium: 9.3 mg/dL (ref 8.4–10.5)
Chloride: 98 meq/L (ref 96–112)
Creatinine, Ser: 1.09 mg/dL (ref 0.40–1.20)
GFR: 49.21 mL/min — ABNORMAL LOW (ref >60.00)
Glucose, Bld: 171 mg/dL — ABNORMAL HIGH (ref 70–99)
Potassium: 3.3 meq/L — ABNORMAL LOW (ref 3.5–5.1)
Sodium: 138 meq/L (ref 135–145)
Total Bilirubin: 0.5 mg/dL (ref 0.2–1.2)
Total Protein: 6.6 g/dL (ref 6.0–8.3)

## 2024-02-20 LAB — VITAMIN D 25 HYDROXY (VIT D DEFICIENCY, FRACTURES): VITD: 41.44 ng/mL (ref 30.00–100.00)

## 2024-02-20 LAB — TSH: TSH: 1.77 u[IU]/mL (ref 0.35–5.50)

## 2024-02-20 MED ORDER — ALBUTEROL SULFATE (2.5 MG/3ML) 0.083% IN NEBU
2.5000 mg | INHALATION_SOLUTION | Freq: Once | RESPIRATORY_TRACT | Status: AC
  Administered 2024-02-20: 2.5 mg via RESPIRATORY_TRACT

## 2024-02-20 MED ORDER — FLUTICASONE-SALMETEROL 250-50 MCG/ACT IN AEPB: 1.0000 | INHALATION_SPRAY | Freq: Two times a day (BID) | RESPIRATORY_TRACT | 3 refills | Status: AC

## 2024-02-20 MED ORDER — PREDNISONE 20 MG PO TABS: 40.0000 mg | ORAL_TABLET | Freq: Every day | ORAL | 0 refills | Status: DC

## 2024-02-20 MED ORDER — ALBUTEROL SULFATE (2.5 MG/3ML) 0.083% IN NEBU: 2.5000 mg | INHALATION_SOLUTION | Freq: Four times a day (QID) | RESPIRATORY_TRACT | 1 refills | Status: AC | PRN

## 2024-02-20 NOTE — Assessment & Plan Note (Signed)
 Chronic, stable. Continue chlorthalidone  25mg  daily, coreg  3.125mg  BID, and valsartan  40mg  daily. Check CMP, CBC, and lipid panel today. Follow-up in 6 months.

## 2024-02-20 NOTE — Assessment & Plan Note (Signed)
 Stress incontinence exacerbated by coughing from asthma. Recommend Kegel exercises.

## 2024-02-20 NOTE — Progress Notes (Signed)
 Established Patient Office Visit  Subjective   Patient ID: Samantha Davidson, female    DOB: [birth year]  Age: 78 y.o. MRN: [id number]  Chief Complaint  Patient presents with   Breathing Problem    Concerns with restricted breathing and coughing at night    HPI Discussed the use of AI scribe software for clinical note transcription with the patient, who gave verbal consent to proceed.  History of Present Illness   Samantha Davidson is a 78 year old female with asthma who presents with breathing difficulties.  She experiences significant breathing difficulties with persistent mucus in her throat. She uses albuterol  every two hours and Wixela twice daily, sometimes three times if breathing is difficult. Her symptoms worsened after returning from a trip to [place]  a month ago, where she contracted an illness on the plane and was sick for three weeks. She experiences wheezing and shortness of breath, particularly at night, which disrupts her sleep. There is no chest pain, but she feels tightness and discomfort in her chest. Coughing to the point of vomiting and experiencing urinary incontinence when coughing are also present.  She has a history of TAVR for severe aortic stenosis. Her heart surgeon informed her that her other arteries are not in optimal condition. She feels tired and lacks energy, which she attributes to her breathing issues. Dizziness has returned after initially resolving post-TAVR. She saw her neurosurgery for this and is going to start PT.       ROS See pertinent positives and negatives per HPI.    Objective:     BP 138/80 (BP Location: Left Arm, Patient Position: Sitting, Cuff Size: Normal) Comment: Has not taken BP Medicine  Pulse 91   Temp (!) 97 F (36.1 C)   Ht 5' 3.5 (1.613 m)   Wt 186 lb 6.4 oz (84.6 kg)   SpO2 99%   BMI 32.50 kg/m  BP Readings from Last 3 Encounters:  02/20/24 138/80  08/25/23 (!) 145/88  07/13/23 132/72   Wt  Readings from Last 3 Encounters:  02/20/24 186 lb 6.4 oz (84.6 kg)  08/25/23 184 lb (83.5 kg)  07/13/23 181 lb 6.4 oz (82.3 kg)      Physical Exam Vitals and nursing note reviewed.  Constitutional:      General: She is not in acute distress.    Appearance: Normal appearance.  HENT:     Head: Normocephalic.     Right Ear: Tympanic membrane, ear canal and external ear normal.     Left Ear: Tympanic membrane, ear canal and external ear normal.  Eyes:     Conjunctiva/sclera: Conjunctivae normal.  Cardiovascular:     Rate and Rhythm: Normal rate and regular rhythm.     Pulses: Normal pulses.     Heart sounds: Normal heart sounds.  Pulmonary:     Effort: Pulmonary effort is normal.     Breath sounds: Wheezing present.  Musculoskeletal:     Cervical back: Normal range of motion and neck supple. No tenderness.     Right lower leg: No edema.     Left lower leg: No edema.  Lymphadenopathy:     Cervical: No cervical adenopathy.  Skin:    General: Skin is warm.  Neurological:     General: No focal deficit present.     Mental Status: She is alert and oriented to person, place, and time.  Psychiatric:        Mood and Affect: Mood normal.  Behavior: Behavior normal.        Thought Content: Thought content normal.        Judgment: Judgment normal.      Assessment & Plan:   Problem List Items Addressed This Visit       Cardiovascular and Mediastinum   HTN (hypertension) - Primary   Chronic, stable. Continue chlorthalidone  25mg  daily, coreg  3.125mg  BID, and valsartan  40mg  daily. Check CMP, CBC, and lipid panel today. Follow-up in 6 months.         Respiratory   Asthma   Chronic, not controlled. Exacerbation with wheezing, dyspnea, and mucus production likely due to a recent respiratory infection. Managed with albuterol  and Wixela. Will start prednisone  40mg  daily x5 days. Will increase Wixela to 250-50 mg 2 puffs BID and albuterol  nebulizer or inhaler every 4 hours as  needed. Spirometry showed a FVC/FEV1 ratio of 0.75, FVC of 1.65 and FEV1 of 1.25. No significant changes post albuterol . Possible restrictive pattern.       Relevant Medications   predniSONE  (DELTASONE ) 20 MG tablet   fluticasone -salmeterol (WIXELA INHUB) 250-50 MCG/ACT AEPB   albuterol  (PROVENTIL ) (2.5 MG/3ML) 0.083% nebulizer solution   Other Relevant Orders   Spirometry with graph     Endocrine   Thyroid  nodule   Thyroid  nodule requires follow-up ultrasound to monitor changes. Order thyroid  ultrasound and coordinate scheduling with DEXA scan. Check TSH today.       Relevant Orders   TSH (Completed)   US  THYROID      Musculoskeletal and Integument   Age-related osteoporosis without current pathological fracture   Osteoporosis managed with Fosamax  70mg  weekly. Follow-up DEXA scan recommended to assess bone density. Order DEXA scan and coordinate scheduling with thyroid  ultrasound.      Relevant Orders   VITAMIN D  25 Hydroxy (Vit-D Deficiency, Fractures) (Completed)   DG Bone Density     Other   Pure hypercholesterolemia   Chronic, stable. Continue rosuvastatin  40mg  daily and zetia  10mg  daily. Check CMP, CBC, lipid panel today and adjust regimen based on results.       Relevant Orders   CBC with Differential/Platelet (Completed)   Comprehensive metabolic panel with GFR (Completed)   Lipid panel (Completed)   S/P TAVR (transcatheter aortic valve replacement)   Successful TAVR for severe aortic stenosis with significant improvement. She is following with cardiology and reviewed their notes. She has finished cardiac rehab as well.      Stress incontinence   Stress incontinence exacerbated by coughing from asthma. Recommend Kegel exercises.      Other Visit Diagnoses       Dizziness       She is following with neurosurgery and is going to start with PT soon.       Return in about 6 months (around 08/22/2024) for Asthma.    Tinnie DELENA Harada, NP

## 2024-02-20 NOTE — Patient Instructions (Addendum)
 It was great to see you!  We are checking your labs today and will let you know the results via mychart/phone.   I have ordered a thyroid  ultrasound, call our office when you get scheduled so we can schedule your dexa scan on the same day  Start prednisone  2 tablets daily in the morning with food for 5 days  I have sent in a higher dose of the wixela to start twice a day   Start mucinex twice a day to help break up the congestion  Start doing kegel exercises   Let's follow-up in 6 months, sooner if you have concerns.  If a referral was placed today, you will be contacted for an appointment. Please note that routine referrals can sometimes take up to 3-4 weeks to process. Please call our office if you haven't heard anything after this time frame.  Take care,  Tinnie Harada, NP

## 2024-02-20 NOTE — Assessment & Plan Note (Addendum)
 Chronic, not controlled. Exacerbation with wheezing, dyspnea, and mucus production likely due to a recent respiratory infection. Managed with albuterol  and Wixela. Will start prednisone  40mg  daily x5 days. Will increase Wixela to 250-50 mg 2 puffs BID and albuterol  nebulizer or inhaler every 4 hours as needed. Spirometry showed a FVC/FEV1 ratio of 0.75, FVC of 1.65 and FEV1 of 1.25. No significant changes post albuterol . Possible restrictive pattern.

## 2024-02-20 NOTE — Assessment & Plan Note (Signed)
 Osteoporosis managed with Fosamax  70mg  weekly. Follow-up DEXA scan recommended to assess bone density. Order DEXA scan and coordinate scheduling with thyroid  ultrasound.

## 2024-02-20 NOTE — Assessment & Plan Note (Signed)
 Chronic, stable. Continue rosuvastatin  40mg  daily and zetia  10mg  daily. Check CMP, CBC, lipid panel today and adjust regimen based on results.

## 2024-02-20 NOTE — Assessment & Plan Note (Signed)
 Thyroid  nodule requires follow-up ultrasound to monitor changes. Order thyroid  ultrasound and coordinate scheduling with DEXA scan. Check TSH today.

## 2024-02-20 NOTE — Assessment & Plan Note (Signed)
 Successful TAVR for severe aortic stenosis with significant improvement. She is following with cardiology and reviewed their notes. She has finished cardiac rehab as well.

## 2024-02-21 ENCOUNTER — Ambulatory Visit: Payer: Self-pay | Admitting: Nurse Practitioner

## 2024-02-28 ENCOUNTER — Ambulatory Visit
Admission: RE | Admit: 2024-02-28 | Discharge: 2024-02-28 | Disposition: A | Discharge status: Home / Self Care | Source: Ambulatory Visit | Attending: Nurse Practitioner | Admitting: Nurse Practitioner

## 2024-02-28 DIAGNOSIS — E041 Nontoxic single thyroid nodule: Secondary | ICD-10-CM

## 2024-03-10 ENCOUNTER — Other Ambulatory Visit: Payer: Self-pay | Admitting: Nurse Practitioner

## 2024-03-13 NOTE — Telephone Encounter (Signed)
 Requesting: CITALOPRAM  HBR 20 MG TABLET , CHLORTHALIDONE  25 MG TABLET , FAMOTIDINE  40 MG TABLET  Last Visit: 02/20/2024 Next Visit: Visit date not found Last Refill: 02/17/2023  Please Advise

## 2024-04-10 ENCOUNTER — Other Ambulatory Visit: Payer: Self-pay | Admitting: Nurse Practitioner

## 2024-04-11 NOTE — Telephone Encounter (Signed)
 Requesting: ALBUTEROL  HFA (VENTOLIN ) INH  Last Visit: 02/20/2024 Next Visit: 09/21/2024 Last Refill: 02/17/2023  Please Advise

## 2024-05-21 ENCOUNTER — Inpatient Hospital Stay (HOSPITAL_COMMUNITY): Admission: RE | Admit: 2024-05-21 | Payer: Medicare Other | Source: Ambulatory Visit

## 2024-05-28 ENCOUNTER — Encounter (HOSPITAL_COMMUNITY): Payer: Self-pay

## 2024-06-28 ENCOUNTER — Ambulatory Visit (HOSPITAL_BASED_OUTPATIENT_CLINIC_OR_DEPARTMENT_OTHER)
Admission: RE | Admit: 2024-06-28 | Discharge: 2024-06-28 | Disposition: A | Discharge status: Home / Self Care | Source: Ambulatory Visit | Attending: Internal Medicine | Admitting: Internal Medicine

## 2024-06-28 ENCOUNTER — Ambulatory Visit: Admitting: Physician Assistant

## 2024-06-28 ENCOUNTER — Ambulatory Visit (HOSPITAL_COMMUNITY): Admission: RE | Admit: 2024-06-28 | Source: Ambulatory Visit

## 2024-06-28 ENCOUNTER — Ambulatory Visit (HOSPITAL_COMMUNITY)
Admission: RE | Admit: 2024-06-28 | Discharge: 2024-06-28 | Disposition: A | Discharge status: Home / Self Care | Source: Ambulatory Visit | Attending: Student in an Organized Health Care Education/Training Program | Admitting: Student in an Organized Health Care Education/Training Program

## 2024-06-28 VITALS — Ht 63.5 in | Wt 181.0 lb

## 2024-06-28 DIAGNOSIS — I6523 Occlusion and stenosis of bilateral carotid arteries: Secondary | ICD-10-CM | POA: Insufficient documentation

## 2024-06-28 DIAGNOSIS — E876 Hypokalemia: Secondary | ICD-10-CM

## 2024-06-28 DIAGNOSIS — Z952 Presence of prosthetic heart valve: Secondary | ICD-10-CM | POA: Insufficient documentation

## 2024-06-28 DIAGNOSIS — R42 Dizziness and giddiness: Secondary | ICD-10-CM

## 2024-06-28 DIAGNOSIS — I779 Disorder of arteries and arterioles, unspecified: Secondary | ICD-10-CM

## 2024-06-28 DIAGNOSIS — I10 Essential (primary) hypertension: Secondary | ICD-10-CM | POA: Insufficient documentation

## 2024-06-28 DIAGNOSIS — E785 Hyperlipidemia, unspecified: Secondary | ICD-10-CM

## 2024-06-28 LAB — ECHOCARDIOGRAM COMPLETE
AV Mean grad: 14 mmHg
AV Peak grad: 25.4 mmHg
Ao pk vel: 2.52 m/s
Area-P 1/2: 3.37 cm2
S' Lateral: 2 cm

## 2024-06-28 NOTE — Patient Instructions (Signed)
 Medication Instructions:  Your physician has recommended you make the following change in your medication:  STOP taking carvedilol  3.125mg .  *If you need a refill on your cardiac medications before your next appointment, please call your pharmacy*  Lab Work: None needed If you have labs (blood work) drawn today and your tests are completely normal, you will receive your results only by: MyChart Message (if you have MyChart) OR A paper copy in the mail If you have any lab test that is abnormal or we need to change your treatment, we will call you to review the results.  Testing/Procedures: None needed  Follow-Up: At [facility], you and your health needs are our priority.  As part of our continuing mission to provide you with exceptional heart care, our providers are all part of one team.  This team includes your primary Cardiologist (physician) and Advanced Practice Providers or APPs (Physician Assistants and Nurse Practitioners) who all work together to provide you with the care you need, when you need it.  Your next appointment:   As scheduled for 09/25/24  Provider:   Stanly DELENA Leavens, MD    We recommend signing up for the patient portal called MyChart.  Sign up information is provided on this After Visit Summary.  MyChart is used to connect with patients for Virtual Visits (Telemedicine).  Patients are able to view lab/test results, encounter notes, upcoming appointments, etc.  Non-urgent messages can be sent to your provider as well.   To learn more about what you can do with MyChart, go to [email or web address].

## 2024-06-28 NOTE — Progress Notes (Signed)
 [facility]   MULTIDISCIPLINARY HEART VALVE CLINIC                                     Cardiology Office Note:    Date:  06/29/2024   ID:  Samantha Davidson, DOB 1946-02-15, MRN [id number]  PCP:  Samantha Davidson LABOR, NP  [facility] Cardiologist:  Samantha LABOR Leavens, MD  [facility] Structural heart: Lonni Cash, MD [facility] Electrophysiologist:  None   Referring MD: Samantha Davidson LABOR, NP   1 year s/p TAVR  History of Present Illness:    Samantha Davidson is a 78 y.o. female with a hx of HTN, HLD, carotid artery disease, previous fall with cervical spine fracture, CKD stage IIIa, asthma, obesity (BMI 30), and severe aortic stenosis s/p TAVR (07/05/23) who presents to clinic for follow up.   She was previously living in [facility] [place]  and followed by cardiology there for aortic stenosis. She subsequently sold that house and moved to [place] [facility] but comes to [place] for her medical care since she had previously lived in [place] for many years. Echo 06/02/2023 showed  EF 60-65% and severe AS with a mean grad 39 mmHg & AVA 1.07 cm2. She reported progressive exertional shortness of breath and fatigue. Cardiac catheterization 06/16/23 showed mild non-obstructive coronary artery disease and normal right heart pressures. S/p successful TAVR with a 23 mm Edwards Sapien 3 Ultra Resilia THV via the TF approach on 07/05/23. Post operative echo showed EF 60%, normally functioning TAVR with a mean gradient of 13 mmHg and no PVL as well as moderate MAC with mild MS. She was continued on home baby aspirin  81mg  daily.   She has had ongoing issues with dizziness. MRA in 01/2024 showed small focus of early subacute ischemia at the left temporal occipital junction. As well as mild narrowing of the proximal right ICA just distal to the bifurcation with less than 50% stenosis.   Today the patient presents to clinic for follow up. She is not doing as  well as she would like. She has had worsening dizziness and bilateral vision loss (but upon further questioning maybe haziness). Dizziness and visual changes note with positional changes (like getting out of bed) but also with head movement. BP normal at home. Did not take BP meds today. Has had worsening asthma and recently treated with steroids. No CP. No LE edema, orthopnea or PND. No syncope. No blood in stool or urine. No palpitations.    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 Susy & Dr. Lucas   Severe aortic stenosis    Tubular adenoma of colon      Current Medications: Current Meds  Medication Sig   albuterol  (PROVENTIL ) (2.5 MG/3ML) 0.083% nebulizer solution Take 3 mLs (2.5 mg total) by nebulization every 6 (six) hours as needed for wheezing or shortness of breath.   albuterol  (VENTOLIN  HFA) 108 (90 Base) MCG/ACT inhaler INHALE 1-2 PUFFS BY MOUTH EVERY 6 HOURS AS NEEDED FOR WHEEZE OR SHORTNESS OF BREATH  alendronate  (FOSAMAX ) 70 MG tablet Take 1 tablet (70 mg total) by mouth once a week.   amLODipine  (NORVASC ) 10 MG tablet Take 10 mg by mouth daily.   amoxicillin  (AMOXIL ) 500 MG tablet Take 4 tablets (2,000 mg total) by mouth as directed. 1 hour prior to dental work including cleanings   aspirin  81 MG EC tablet Take 81 mg by mouth daily.     buPROPion  (WELLBUTRIN  XL) 300 MG 24 hr tablet 1 po qd   chlorthalidone  (HYGROTON ) 25 MG tablet TAKE 1 TABLET (25 MG TOTAL) BY MOUTH DAILY.   cholecalciferol (VITAMIN D3) 25 MCG (1000 UNIT) tablet Take 1,000 Units by mouth daily.   citalopram  (CELEXA ) 20 MG tablet TAKE 1 TABLET BY MOUTH EVERY DAY   EPINEPHrine  (EPIPEN ) 0.3 mg/0.3 mL DEVI  Inject 0.3 mLs (0.3 mg total) into the muscle once.   ezetimibe  (ZETIA ) 10 MG tablet Take 1 tablet (10 mg total) by mouth daily.   famotidine  (PEPCID ) 40 MG tablet TAKE 1 TABLET BY MOUTH EVERYDAY AT BEDTIME   fexofenadine  (ALLEGRA ) 180 MG tablet Take by mouth as needed for allergies.   fluticasone -salmeterol (WIXELA INHUB) 250-50 MCG/ACT AEPB Inhale 1 puff into the lungs in the morning and at bedtime.   loratadine  (CLARITIN ) 10 MG tablet Take 10 mg by mouth daily.   Multiple Vitamin (MULTIVITAMIN PO) Take 1 tablet by mouth daily.     rosuvastatin  (CRESTOR ) 40 MG tablet Take 1 tablet (40 mg total) by mouth daily.   valsartan  (DIOVAN ) 40 MG tablet TAKE 1 TABLET BY MOUTH EVERY DAY   [DISCONTINUED] carvedilol  (COREG ) 3.125 MG tablet Take 1 tablet (3.125 mg total) by mouth 2 (two) times daily.      ROS:   Please see the history of present illness.    All other systems reviewed and are negative.  EKGs       Risk Assessment/Calculations:           Physical Exam:    VS:  Ht 5' 3.5 (1.613 m)   Wt 181 lb (82.1 kg)   SpO2 97%   BMI 31.56 kg/m     Wt Readings from Last 3 Encounters:  06/28/24 181 lb (82.1 kg)  02/20/24 186 lb 6.4 oz (84.6 kg)  08/25/23 184 lb (83.5 kg)     GEN: Well nourished, well developed in no acute distress NECK: No JVD CARDIAC: RRR, soft flow murmur @ RUSB. No rubs, gallops RESPIRATORY:  Clear to auscultation without rales, wheezing or rhonchi  ABDOMEN: Soft, non-tender, non-distended.  EXTREMITIES:  No edema; No deformity.  ASSESSMENT:    1. S/P TAVR (transcatheter aortic valve replacement)   2. Essential hypertension   3. Hyperlipidemia, unspecified hyperlipidemia type   4. Hypokalemia   5. Bilateral carotid artery disease, unspecified type   6. Dizziness     PLAN:    In order of problems listed above:  Severe AS s/p TAVR:  -- Echo today shows EF 65%, normally functioning TAVR with a mean gradient of 14 mm hg and trivial PVL.  -- NYHA  class II symptoms.  -- Continue aspirin  81mg  daily -- She has amoxicillin  for SBE prophylaxis.  -- Continue follow up with Dr. Lovetta.  HTN:  -- BP elevated today but she did not take her BP meds today.  -- Continue Norvasc  10mg  daily, chlorthalidone  25mg  daily, valsartan  40mg  daily. -- She was started on carvedilol  3.25mg  BID at some point in time and seems like dizziness has been worse. So plan to  stop this now and follow.   HLD:  -- Continue on Lipitor 40mg  daily and Zetia  10 mg daily. -- Followed by PCP  Hypokalemia:  -- Continue potassium supplementation.   Carotid artery disease: -- Duplex competed today.   Dizziness:  -- Has been a chronic issue with recent worsening.  -- Dopplers today with 1-39% stenosis.  -- Orthostatics in office today showed BP 161/84 (HR 76) --> 146/86 (HR 77) --> 137/84 (HR 81). No dizziness with this.  -- As above, stop carvedilol  3.25mg  BID and follow.  -- Both with positional changes as well as head movement. Would send to ENT if it does not improve.    Medication Adjustments/Labs and Tests Ordered: Current medicines are reviewed at length with the patient today.  Concerns regarding medicines are outlined above.  No orders of the defined types were placed in this encounter.  No orders of the defined types were placed in this encounter.   Patient Instructions  Medication Instructions:  Your physician has recommended you make the following change in your medication:  STOP taking carvedilol  3.125mg .  *If you need a refill on your cardiac medications before your next appointment, please call your pharmacy*  Lab Work: None needed If you have labs (blood work) drawn today and your tests are completely normal, you will receive your results only by: MyChart Message (if you have MyChart) OR A paper copy in the mail If you have any lab test that is abnormal or we need to change your treatment, we will call you to review the  results.  Testing/Procedures: None needed  Follow-Up: At [facility], you and your health needs are our priority.  As part of our continuing mission to provide you with exceptional heart care, our providers are all part of one team.  This team includes your primary Cardiologist (physician) and Advanced Practice Providers or APPs (Physician Assistants and Nurse Practitioners) who all work together to provide you with the care you need, when you need it.  Your next appointment:   As scheduled for 09/25/24  Provider:   Stanly DELENA Leavens, MD    We recommend signing up for the patient portal called MyChart.  Sign up information is provided on this After Visit Summary.  MyChart is used to connect with patients for Virtual Visits (Telemedicine).  Patients are able to view lab/test results, encounter notes, upcoming appointments, etc.  Non-urgent messages can be sent to your provider as well.   To learn more about what you can do with MyChart, go to [email or web address].           Signed, Lamarr Hummer, PA-C  06/29/2024 11:23 AM    [facility]

## 2024-06-29 ENCOUNTER — Ambulatory Visit: Payer: Self-pay | Admitting: Physician Assistant

## 2024-07-04 ENCOUNTER — Ambulatory Visit: Payer: Self-pay | Admitting: *Deleted

## 2024-07-04 ENCOUNTER — Other Ambulatory Visit (HOSPITAL_COMMUNITY): Payer: Medicare Other

## 2024-07-04 ENCOUNTER — Ambulatory Visit: Payer: Medicare Other

## 2024-07-11 NOTE — Telephone Encounter (Signed)
 Letter of results sent to pt

## 2024-09-11 ENCOUNTER — Other Ambulatory Visit: Payer: Self-pay | Admitting: Internal Medicine

## 2024-09-13 NOTE — Telephone Encounter (Signed)
 Last lipid panel 02/19/25. Rosuvastatin  refill sent to last until appt date. Carvedilol  denied due to being discontinued at 06/28/24 visit.

## 2024-09-21 ENCOUNTER — Ambulatory Visit: Payer: Medicare Other

## 2024-09-24 ENCOUNTER — Ambulatory Visit

## 2024-09-25 ENCOUNTER — Ambulatory Visit: Admitting: Internal Medicine
